# Patient Record
Sex: Female | Born: 1970 | Race: White | Hispanic: No | Marital: Married | Smoking: Former smoker
Health system: Southern US, Community
[De-identification: ages and names within clinical notes are randomized; demographics above are authoritative.]

## PROBLEM LIST (undated history)

## (undated) DIAGNOSIS — K219 Gastro-esophageal reflux disease without esophagitis: Secondary | ICD-10-CM

## (undated) DIAGNOSIS — K649 Unspecified hemorrhoids: Secondary | ICD-10-CM

## (undated) DIAGNOSIS — K635 Polyp of colon: Secondary | ICD-10-CM

## (undated) DIAGNOSIS — M797 Fibromyalgia: Secondary | ICD-10-CM

## (undated) DIAGNOSIS — F32A Depression, unspecified: Secondary | ICD-10-CM

## (undated) DIAGNOSIS — F329 Major depressive disorder, single episode, unspecified: Secondary | ICD-10-CM

## (undated) DIAGNOSIS — G43909 Migraine, unspecified, not intractable, without status migrainosus: Secondary | ICD-10-CM

## (undated) DIAGNOSIS — G4700 Insomnia, unspecified: Secondary | ICD-10-CM

## (undated) HISTORY — DX: Migraine, unspecified, not intractable, without status migrainosus: G43.909

## (undated) HISTORY — DX: Major depressive disorder, single episode, unspecified: F32.9

## (undated) HISTORY — DX: Depression, unspecified: F32.A

## (undated) HISTORY — DX: Unspecified hemorrhoids: K64.9

## (undated) HISTORY — DX: Insomnia, unspecified: G47.00

## (undated) HISTORY — DX: Fibromyalgia: M79.7

## (undated) HISTORY — DX: Polyp of colon: K63.5

## (undated) HISTORY — DX: Gastro-esophageal reflux disease without esophagitis: K21.9

---

## 2011-08-01 ENCOUNTER — Encounter: Payer: Self-pay | Admitting: Family Medicine

## 2011-08-01 ENCOUNTER — Inpatient Hospital Stay (INDEPENDENT_AMBULATORY_CARE_PROVIDER_SITE_OTHER)
Admission: RE | Admit: 2011-08-01 | Discharge: 2011-08-01 | Disposition: A | Discharge status: Home / Self Care | Payer: BC Managed Care – PPO | Source: Ambulatory Visit | Attending: Family Medicine | Admitting: Family Medicine

## 2011-08-01 DIAGNOSIS — J00 Acute nasopharyngitis [common cold]: Secondary | ICD-10-CM

## 2011-08-01 DIAGNOSIS — J209 Acute bronchitis, unspecified: Secondary | ICD-10-CM | POA: Insufficient documentation

## 2011-08-01 DIAGNOSIS — J019 Acute sinusitis, unspecified: Secondary | ICD-10-CM

## 2011-11-12 NOTE — Progress Notes (Signed)
Summary: HAD AND CHEST COLD...WSE (rm 4)   Vital Signs:  Patient Profile:   40 Years Old Female CC:      productive cough, fever, congestion, HA x 2 days Height:     65 inches Weight:      139 pounds O2 Sat:      97 % O2 treatment:    Room Air Temp:     98.3 degrees F oral Pulse rate:   88 / minute Resp:     16 per minute BP sitting:   113 / 75  (left arm) Cuff size:   regular  Vitals Entered By: Lajean Saver RN (August 01, 2011 1:52 PM)                  Prior Medication List:  No prior medications documented  Updated Prior Medication List: VICODIN 5-500 MG TABS (HYDROCODONE-ACETAMINOPHEN)  TRAZODONE HCL 100 MG TABS (TRAZODONE HCL)  CITALOPRAM HYDROBROMIDE 40 MG TABS (CITALOPRAM HYDROBROMIDE)   Current Allergies: No known allergies History of Present Illness Chief Complaint: productive cough, fever, congestion, HA x 2 days History of Present Illness: Patient reports productive cough and congestion that has been going on for 3 days. She reports fever of 101 this AM that she treated. Feeling worse ,sore throat , sinus pressure chest congestion and productive cough as well. Sh estates that her husband was sick earlier but able to shaake his infection.  Current Problems: ACUTE SINUSITIS, UNSPECIFIED (ICD-461.9) ACUTE NASOPHARYNGITIS (ICD-460) BRONCHITIS, ACUTE (ICD-466.0)   Current Meds VICODIN 5-500 MG TABS (HYDROCODONE-ACETAMINOPHEN)  TRAZODONE HCL 100 MG TABS (TRAZODONE HCL)  CITALOPRAM HYDROBROMIDE 40 MG TABS (CITALOPRAM HYDROBROMIDE)  AUGMENTIN 875-125 MG TABS (AMOXICILLIN-POT CLAVULANATE) 1 by mouth 2 times daily ALLEGRA-D ALLERGY & CONGESTION 180-240 MG XR24H-TAB (FEXOFENADINE-PSEUDOEPHEDRINE) 1 by mouth q day  REVIEW OF SYSTEMS Constitutional Symptoms       Complains of fever, chills, and fatigue.     Denies night sweats, weight loss, and weight gain.  Eyes       Denies change in vision, eye pain, eye discharge, glasses, contact lenses, and eye  surgery. Ear/Nose/Throat/Mouth       Complains of ear pain and frequent runny nose.      Denies hearing loss/aids, change in hearing, ear discharge, dizziness, frequent nose bleeds, sinus problems, sore throat, hoarseness, and tooth pain or bleeding.  Respiratory       Complains of productive cough.      Denies dry cough, wheezing, shortness of breath, asthma, bronchitis, and emphysema/COPD.  Cardiovascular       Complains of chest pain.      Denies murmurs and tires easily with exhertion.      Comments: with cough   Gastrointestinal       Denies stomach pain, nausea/vomiting, diarrhea, constipation, blood in bowel movements, and indigestion. Genitourniary       Denies painful urination, kidney stones, and loss of urinary control. Neurological       Complains of headaches.      Denies paralysis, seizures, and fainting/blackouts. Musculoskeletal       Complains of muscle pain, joint pain, joint stiffness, swelling, and muscle weakness.      Denies decreased range of motion, redness, and gout.      Comments: usual fibromyalgia pain Skin       Denies bruising, unusual mles/lumps or sores, and hair/skin or nail changes.  Psych       Complains of sleep problems.      Denies mood changes,  temper/anger issues, anxiety/stress, speech problems, and depression. Other Comments: Symptoms x 2 days. Fever 101 this am. Taken tylenol   Past History:  Social History: Last updated: 08/01/2011 Married Alcohol use-yes Drug use-no Former Smoker  Risk Factors: Smoking Status: quit (08/01/2011)  Past Medical History: endometriosis interstital cystitis fibromyalgia Migraines IBS  Past Surgical History: "endo scrapes" 5  Family History: Reviewed history and no changes required.  Social History: Reviewed history and no changes required. Married Alcohol use-yes Drug use-no Former Smoker Smoking Status:  quit Drug Use:  no Physical Exam General appearance: ill apearing WF Head:  normocephalic, atraumatic Ears: fluid noted without inflammaton left TM Nasal: swollen red turbinates with congestion Oral/Pharynx: pharyngeal erythema without exudate, uvula midline without deviation Neck: supple,anterior lymphadenopathy present Chest/Lungs: scattered rhonchi actively coughing Heart: regular rate and  rhythm, no murmur Skin: no obvious rashes or lesions MSE: oriented to time, place, and person Assessment Problems:   New Problems: ACUTE SINUSITIS, UNSPECIFIED (ICD-461.9) ACUTE NASOPHARYNGITIS (ICD-460) BRONCHITIS, ACUTE (ICD-466.0)   Patient Education: Patient and/or caregiver instructed in the following: rest fluids and Tylenol.  Plan New Medications/Changes: ALLEGRA-D ALLERGY & CONGESTION 180-240 MG XR24H-TAB (FEXOFENADINE-PSEUDOEPHEDRINE) 1 by mouth q day  #30 x 0, 08/01/2011, Hassan Rowan MD AUGMENTIN [phone] MG TABS (AMOXICILLIN-POT CLAVULANATE) 1 by mouth 2 times daily  #20 x 0, 08/01/2011, Hassan Rowan MD  New Orders: New Patient Level III [04540] Rapid Strep [98119] Follow Up: Follow up in 2-3 days if no improvement, Follow up on an as needed basis, Follow up with Primary Physician Work/School Excuse: Return to work/school in 2 days  The patient and/or caregiver has been counseled thoroughly with regard to medications prescribed including dosage, schedule, interactions, rationale for use, and possible side effects and they verbalize understanding.  Diagnoses and expected course of recovery discussed and will return if not improved as expected or if the condition worsens. Patient and/or caregiver verbalized understanding.  Prescriptions: ALLEGRA-D ALLERGY & CONGESTION 180-240 MG XR24H-TAB (FEXOFENADINE-PSEUDOEPHEDRINE) 1 by mouth q day  #30 x 0   Entered and Authorized by:   Hassan Rowan MD   Signed by:   Hassan Rowan MD on 08/01/2011   Method used:   Print then Give to Patient   RxID:   [id number] AUGMENTIN 875-125 MG TABS (AMOXICILLIN-POT CLAVULANATE)  1 by mouth 2 times daily  #20 x 0   Entered and Authorized by:   Hassan Rowan MD   Signed by:   Hassan Rowan MD on 08/01/2011   Method used:   Print then Give to Patient   RxID:   [id number]   Patient Instructions: 1)  Please schedule a follow-up appointment as needed. 2)  Please schedule an appointment with your primary doctor in :3-5days 3)  Get plenty of rest, drink lots of clear liquids, and use Tylenol or Ibuprofen for fever and comfort. Return in 7-10 days if you're not better:sooner if you're feeling worse. 4)  Take your antibiotic as prescribed until ALL of it is gone, but stop if you develop a rash or swelling and contact our office as soon as possible. 5)  Acute sinusitis symptoms for less than 10 days are not helped by antibiotics.Use warm moist compresses, and over the counter decongestants ( only as directed). Call if no improvement in 5-7 days, sooner if increasing pain, fever, or new symptoms. 6)  Acute bronchitis symptoms for less than 10 days are not helped by antibiotics. take over the counter cough medications. call if no improvment in  5-7 days,  sooner if increasing cough, fever, or new symptoms( shortness of breath, chest pain). 7)  Recommended remaining out of work for today and tommorow.  Orders Added: 1)  New Patient Level III [99203] 2)  Rapid Strep [16109]    Laboratory Results  Date/Time Received: August 01, 2011 2:49 PM  Date/Time Reported: August 01, 2011 2:49 PM   Other Tests  Rapid Strep: negative  Kit Test Internal QC: Negative   (Normal Range: Negative)

## 2011-11-12 NOTE — Letter (Signed)
Summary: Out of Work  [facility]  [address]   [place], [place] [address]   Phone: [phone]  Fax: [phone]    August 01, 2011   Employee:  Tobe Sos    To Whom It May Concern:   For Medical reasons, please excuse the above named employee from work for the following dates:  Start:   08/01/2011  Return:   08/03/2011  If you need additional information, please feel free to contact our office.         Sincerely,    Hassan Rowan MD

## 2014-01-24 DIAGNOSIS — G894 Chronic pain syndrome: Secondary | ICD-10-CM | POA: Insufficient documentation

## 2014-01-24 DIAGNOSIS — R519 Headache, unspecified: Secondary | ICD-10-CM | POA: Insufficient documentation

## 2015-04-19 DIAGNOSIS — G43101 Migraine with aura, not intractable, with status migrainosus: Secondary | ICD-10-CM | POA: Insufficient documentation

## 2015-12-27 DIAGNOSIS — M542 Cervicalgia: Secondary | ICD-10-CM | POA: Insufficient documentation

## 2016-07-03 DIAGNOSIS — M47812 Spondylosis without myelopathy or radiculopathy, cervical region: Secondary | ICD-10-CM | POA: Insufficient documentation

## 2017-11-25 DIAGNOSIS — K219 Gastro-esophageal reflux disease without esophagitis: Secondary | ICD-10-CM | POA: Insufficient documentation

## 2018-01-17 DIAGNOSIS — G43909 Migraine, unspecified, not intractable, without status migrainosus: Secondary | ICD-10-CM | POA: Diagnosis not present

## 2018-02-03 DIAGNOSIS — M47814 Spondylosis without myelopathy or radiculopathy, thoracic region: Secondary | ICD-10-CM | POA: Insufficient documentation

## 2018-02-10 DIAGNOSIS — K219 Gastro-esophageal reflux disease without esophagitis: Secondary | ICD-10-CM | POA: Diagnosis not present

## 2018-02-10 DIAGNOSIS — K921 Melena: Secondary | ICD-10-CM | POA: Diagnosis not present

## 2018-02-10 DIAGNOSIS — K5730 Diverticulosis of large intestine without perforation or abscess without bleeding: Secondary | ICD-10-CM | POA: Diagnosis not present

## 2018-02-10 DIAGNOSIS — K209 Esophagitis, unspecified: Secondary | ICD-10-CM | POA: Diagnosis not present

## 2018-02-10 LAB — HM COLONOSCOPY

## 2018-02-18 DIAGNOSIS — K219 Gastro-esophageal reflux disease without esophagitis: Secondary | ICD-10-CM | POA: Diagnosis not present

## 2018-02-18 DIAGNOSIS — M542 Cervicalgia: Secondary | ICD-10-CM | POA: Diagnosis not present

## 2018-02-18 DIAGNOSIS — G4700 Insomnia, unspecified: Secondary | ICD-10-CM | POA: Diagnosis not present

## 2018-02-18 DIAGNOSIS — Z820 Family history of epilepsy and other diseases of the nervous system: Secondary | ICD-10-CM | POA: Diagnosis not present

## 2018-02-18 DIAGNOSIS — M797 Fibromyalgia: Secondary | ICD-10-CM | POA: Diagnosis not present

## 2018-02-18 DIAGNOSIS — K449 Diaphragmatic hernia without obstruction or gangrene: Secondary | ICD-10-CM | POA: Diagnosis not present

## 2018-02-18 DIAGNOSIS — F418 Other specified anxiety disorders: Secondary | ICD-10-CM | POA: Diagnosis not present

## 2018-02-18 DIAGNOSIS — G894 Chronic pain syndrome: Secondary | ICD-10-CM | POA: Diagnosis not present

## 2018-02-18 DIAGNOSIS — Z91030 Bee allergy status: Secondary | ICD-10-CM | POA: Diagnosis not present

## 2018-02-18 DIAGNOSIS — G43109 Migraine with aura, not intractable, without status migrainosus: Secondary | ICD-10-CM | POA: Diagnosis not present

## 2018-02-18 DIAGNOSIS — Z8269 Family history of other diseases of the musculoskeletal system and connective tissue: Secondary | ICD-10-CM | POA: Diagnosis not present

## 2018-02-18 DIAGNOSIS — N809 Endometriosis, unspecified: Secondary | ICD-10-CM | POA: Diagnosis not present

## 2018-02-18 DIAGNOSIS — K589 Irritable bowel syndrome without diarrhea: Secondary | ICD-10-CM | POA: Diagnosis not present

## 2018-02-18 DIAGNOSIS — M47812 Spondylosis without myelopathy or radiculopathy, cervical region: Secondary | ICD-10-CM | POA: Diagnosis not present

## 2018-02-18 DIAGNOSIS — Z87891 Personal history of nicotine dependence: Secondary | ICD-10-CM | POA: Diagnosis not present

## 2018-02-18 DIAGNOSIS — M47814 Spondylosis without myelopathy or radiculopathy, thoracic region: Secondary | ICD-10-CM | POA: Diagnosis not present

## 2018-03-04 DIAGNOSIS — F419 Anxiety disorder, unspecified: Secondary | ICD-10-CM | POA: Diagnosis not present

## 2018-03-04 DIAGNOSIS — Z87891 Personal history of nicotine dependence: Secondary | ICD-10-CM | POA: Diagnosis not present

## 2018-03-04 DIAGNOSIS — Z91030 Bee allergy status: Secondary | ICD-10-CM | POA: Diagnosis not present

## 2018-03-04 DIAGNOSIS — F329 Major depressive disorder, single episode, unspecified: Secondary | ICD-10-CM | POA: Diagnosis not present

## 2018-03-04 DIAGNOSIS — M797 Fibromyalgia: Secondary | ICD-10-CM | POA: Diagnosis not present

## 2018-03-04 DIAGNOSIS — G43909 Migraine, unspecified, not intractable, without status migrainosus: Secondary | ICD-10-CM | POA: Diagnosis not present

## 2018-03-04 DIAGNOSIS — K219 Gastro-esophageal reflux disease without esophagitis: Secondary | ICD-10-CM | POA: Diagnosis not present

## 2018-03-04 DIAGNOSIS — M47812 Spondylosis without myelopathy or radiculopathy, cervical region: Secondary | ICD-10-CM | POA: Diagnosis not present

## 2018-03-04 DIAGNOSIS — G894 Chronic pain syndrome: Secondary | ICD-10-CM | POA: Diagnosis not present

## 2018-05-29 DIAGNOSIS — Z01419 Encounter for gynecological examination (general) (routine) without abnormal findings: Secondary | ICD-10-CM | POA: Diagnosis not present

## 2018-05-29 DIAGNOSIS — Z124 Encounter for screening for malignant neoplasm of cervix: Secondary | ICD-10-CM | POA: Diagnosis not present

## 2018-05-29 DIAGNOSIS — Z6827 Body mass index (BMI) 27.0-27.9, adult: Secondary | ICD-10-CM | POA: Diagnosis not present

## 2018-05-29 LAB — HM PAP SMEAR

## 2018-06-05 DIAGNOSIS — G43101 Migraine with aura, not intractable, with status migrainosus: Secondary | ICD-10-CM | POA: Diagnosis not present

## 2018-06-05 DIAGNOSIS — K219 Gastro-esophageal reflux disease without esophagitis: Secondary | ICD-10-CM | POA: Diagnosis not present

## 2018-06-05 DIAGNOSIS — G894 Chronic pain syndrome: Secondary | ICD-10-CM | POA: Diagnosis not present

## 2018-06-05 DIAGNOSIS — K449 Diaphragmatic hernia without obstruction or gangrene: Secondary | ICD-10-CM | POA: Diagnosis not present

## 2018-06-05 DIAGNOSIS — F330 Major depressive disorder, recurrent, mild: Secondary | ICD-10-CM | POA: Diagnosis not present

## 2018-06-09 DIAGNOSIS — M47812 Spondylosis without myelopathy or radiculopathy, cervical region: Secondary | ICD-10-CM | POA: Diagnosis not present

## 2018-06-09 DIAGNOSIS — G894 Chronic pain syndrome: Secondary | ICD-10-CM | POA: Diagnosis not present

## 2018-06-09 DIAGNOSIS — M542 Cervicalgia: Secondary | ICD-10-CM | POA: Diagnosis not present

## 2018-06-09 DIAGNOSIS — G43909 Migraine, unspecified, not intractable, without status migrainosus: Secondary | ICD-10-CM | POA: Diagnosis not present

## 2018-07-08 DIAGNOSIS — M797 Fibromyalgia: Secondary | ICD-10-CM | POA: Diagnosis not present

## 2018-08-12 DIAGNOSIS — M797 Fibromyalgia: Secondary | ICD-10-CM | POA: Diagnosis not present

## 2018-08-12 DIAGNOSIS — M47812 Spondylosis without myelopathy or radiculopathy, cervical region: Secondary | ICD-10-CM | POA: Diagnosis not present

## 2018-08-12 DIAGNOSIS — G894 Chronic pain syndrome: Secondary | ICD-10-CM | POA: Diagnosis not present

## 2018-08-25 DIAGNOSIS — M47812 Spondylosis without myelopathy or radiculopathy, cervical region: Secondary | ICD-10-CM | POA: Diagnosis not present

## 2018-08-25 DIAGNOSIS — Z91030 Bee allergy status: Secondary | ICD-10-CM | POA: Diagnosis not present

## 2018-08-25 DIAGNOSIS — F329 Major depressive disorder, single episode, unspecified: Secondary | ICD-10-CM | POA: Diagnosis not present

## 2018-08-25 DIAGNOSIS — F419 Anxiety disorder, unspecified: Secondary | ICD-10-CM | POA: Diagnosis not present

## 2018-08-25 DIAGNOSIS — Z87891 Personal history of nicotine dependence: Secondary | ICD-10-CM | POA: Diagnosis not present

## 2018-08-25 DIAGNOSIS — N3010 Interstitial cystitis (chronic) without hematuria: Secondary | ICD-10-CM | POA: Diagnosis not present

## 2018-08-25 DIAGNOSIS — M797 Fibromyalgia: Secondary | ICD-10-CM | POA: Diagnosis not present

## 2018-08-25 DIAGNOSIS — K589 Irritable bowel syndrome without diarrhea: Secondary | ICD-10-CM | POA: Diagnosis not present

## 2018-08-25 DIAGNOSIS — K219 Gastro-esophageal reflux disease without esophagitis: Secondary | ICD-10-CM | POA: Diagnosis not present

## 2018-08-25 DIAGNOSIS — K449 Diaphragmatic hernia without obstruction or gangrene: Secondary | ICD-10-CM | POA: Diagnosis not present

## 2018-09-08 DIAGNOSIS — Z87891 Personal history of nicotine dependence: Secondary | ICD-10-CM | POA: Diagnosis not present

## 2018-09-08 DIAGNOSIS — F419 Anxiety disorder, unspecified: Secondary | ICD-10-CM | POA: Diagnosis not present

## 2018-09-08 DIAGNOSIS — M797 Fibromyalgia: Secondary | ICD-10-CM | POA: Diagnosis not present

## 2018-09-08 DIAGNOSIS — N3010 Interstitial cystitis (chronic) without hematuria: Secondary | ICD-10-CM | POA: Diagnosis not present

## 2018-09-08 DIAGNOSIS — Z91030 Bee allergy status: Secondary | ICD-10-CM | POA: Diagnosis not present

## 2018-09-08 DIAGNOSIS — K589 Irritable bowel syndrome without diarrhea: Secondary | ICD-10-CM | POA: Diagnosis not present

## 2018-09-08 DIAGNOSIS — K219 Gastro-esophageal reflux disease without esophagitis: Secondary | ICD-10-CM | POA: Diagnosis not present

## 2018-09-08 DIAGNOSIS — F329 Major depressive disorder, single episode, unspecified: Secondary | ICD-10-CM | POA: Diagnosis not present

## 2018-09-08 DIAGNOSIS — M47812 Spondylosis without myelopathy or radiculopathy, cervical region: Secondary | ICD-10-CM | POA: Diagnosis not present

## 2018-09-08 DIAGNOSIS — B880 Other acariasis: Secondary | ICD-10-CM | POA: Diagnosis not present

## 2018-09-24 DIAGNOSIS — G894 Chronic pain syndrome: Secondary | ICD-10-CM | POA: Diagnosis not present

## 2018-09-24 DIAGNOSIS — M797 Fibromyalgia: Secondary | ICD-10-CM | POA: Diagnosis not present

## 2018-09-24 DIAGNOSIS — G43909 Migraine, unspecified, not intractable, without status migrainosus: Secondary | ICD-10-CM | POA: Diagnosis not present

## 2018-09-24 DIAGNOSIS — M47812 Spondylosis without myelopathy or radiculopathy, cervical region: Secondary | ICD-10-CM | POA: Diagnosis not present

## 2018-09-25 DIAGNOSIS — Z5181 Encounter for therapeutic drug level monitoring: Secondary | ICD-10-CM | POA: Diagnosis not present

## 2018-09-25 DIAGNOSIS — G894 Chronic pain syndrome: Secondary | ICD-10-CM | POA: Diagnosis not present

## 2018-09-25 DIAGNOSIS — M797 Fibromyalgia: Secondary | ICD-10-CM | POA: Diagnosis not present

## 2018-09-25 DIAGNOSIS — G43101 Migraine with aura, not intractable, with status migrainosus: Secondary | ICD-10-CM | POA: Diagnosis not present

## 2018-09-25 DIAGNOSIS — Z79899 Other long term (current) drug therapy: Secondary | ICD-10-CM | POA: Diagnosis not present

## 2018-11-03 ENCOUNTER — Encounter: Payer: Self-pay | Admitting: Family

## 2018-11-03 ENCOUNTER — Ambulatory Visit (INDEPENDENT_AMBULATORY_CARE_PROVIDER_SITE_OTHER): Payer: BLUE CROSS/BLUE SHIELD | Admitting: Family

## 2018-11-03 ENCOUNTER — Encounter (INDEPENDENT_AMBULATORY_CARE_PROVIDER_SITE_OTHER): Payer: Self-pay

## 2018-11-03 ENCOUNTER — Other Ambulatory Visit (INDEPENDENT_AMBULATORY_CARE_PROVIDER_SITE_OTHER): Payer: BLUE CROSS/BLUE SHIELD

## 2018-11-03 VITALS — BP 112/80 | HR 67 | Temp 97.7°F | Ht 65.5 in | Wt 166.1 lb

## 2018-11-03 DIAGNOSIS — F32A Depression, unspecified: Secondary | ICD-10-CM

## 2018-11-03 DIAGNOSIS — F419 Anxiety disorder, unspecified: Secondary | ICD-10-CM | POA: Insufficient documentation

## 2018-11-03 DIAGNOSIS — F5104 Psychophysiologic insomnia: Secondary | ICD-10-CM | POA: Insufficient documentation

## 2018-11-03 DIAGNOSIS — G43709 Chronic migraine without aura, not intractable, without status migrainosus: Secondary | ICD-10-CM

## 2018-11-03 DIAGNOSIS — Z13220 Encounter for screening for lipoid disorders: Secondary | ICD-10-CM

## 2018-11-03 DIAGNOSIS — R5383 Other fatigue: Secondary | ICD-10-CM | POA: Diagnosis not present

## 2018-11-03 DIAGNOSIS — F329 Major depressive disorder, single episode, unspecified: Secondary | ICD-10-CM

## 2018-11-03 DIAGNOSIS — K219 Gastro-esophageal reflux disease without esophagitis: Secondary | ICD-10-CM

## 2018-11-03 LAB — LIPID PANEL
CHOL/HDL RATIO: 4
Cholesterol: 223 mg/dL — ABNORMAL HIGH (ref 0–200)
HDL: 58.8 mg/dL (ref >39.00)
LDL CALC: 130 mg/dL — AB (ref 0–99)
NONHDL: 164.41
TRIGLYCERIDES: 170 mg/dL — AB (ref 0.0–149.0)
VLDL: 34 mg/dL (ref 0.0–40.0)

## 2018-11-03 LAB — COMPREHENSIVE METABOLIC PANEL
ALT: 6 U/L (ref 0–35)
AST: 13 U/L (ref 0–37)
Albumin: 4.1 g/dL (ref 3.5–5.2)
Alkaline Phosphatase: 34 U/L — ABNORMAL LOW (ref 39–117)
BUN: 27 mg/dL — ABNORMAL HIGH (ref 6–23)
CALCIUM: 9.2 mg/dL (ref 8.4–10.5)
CHLORIDE: 109 meq/L (ref 96–112)
CO2: 19 meq/L (ref 19–32)
Creatinine, Ser: 1.22 mg/dL — ABNORMAL HIGH (ref 0.40–1.20)
GFR: 50.31 mL/min — AB (ref >60.00)
GLUCOSE: 84 mg/dL (ref 70–99)
Potassium: 4 mEq/L (ref 3.5–5.1)
Sodium: 136 mEq/L (ref 135–145)
Total Bilirubin: 0.3 mg/dL (ref 0.2–1.2)
Total Protein: 7.5 g/dL (ref 6.0–8.3)

## 2018-11-03 LAB — CBC WITH DIFFERENTIAL/PLATELET
BASOS PCT: 0.9 % (ref 0.0–3.0)
Basophils Absolute: 0.1 10*3/uL (ref 0.0–0.1)
EOS PCT: 1.4 % (ref 0.0–5.0)
Eosinophils Absolute: 0.1 10*3/uL (ref 0.0–0.7)
HEMATOCRIT: 41.7 % (ref 36.0–46.0)
Hemoglobin: 14 g/dL (ref 12.0–15.0)
LYMPHS PCT: 32.1 % (ref 12.0–46.0)
Lymphs Abs: 3.4 10*3/uL (ref 0.7–4.0)
MCHC: 33.5 g/dL (ref 30.0–36.0)
MCV: 92.9 fl (ref 78.0–100.0)
MONOS PCT: 7 % (ref 3.0–12.0)
Monocytes Absolute: 0.7 10*3/uL (ref 0.1–1.0)
NEUTROS ABS: 6.1 10*3/uL (ref 1.4–7.7)
Neutrophils Relative %: 58.6 % (ref 43.0–77.0)
PLATELETS: 388 10*3/uL (ref 150.0–400.0)
RBC: 4.48 Mil/uL (ref 3.87–5.11)
RDW: 12.9 % (ref 11.5–15.5)
WBC: 10.5 10*3/uL (ref 4.0–10.5)

## 2018-11-03 LAB — VITAMIN B12: Vitamin B-12: >1500 pg/mL — ABNORMAL HIGH (ref 211–911)

## 2018-11-03 LAB — VITAMIN D 25 HYDROXY (VIT D DEFICIENCY, FRACTURES): VITD: 85.9 ng/mL (ref 30.00–100.00)

## 2018-11-03 LAB — TSH: TSH: 2.07 u[IU]/mL (ref 0.35–4.50)

## 2018-11-03 MED ORDER — TRAZODONE HCL 100 MG PO TABS: 100.0000 mg | ORAL_TABLET | Freq: Every evening | ORAL | 1 refills | Status: DC | PRN

## 2018-11-03 MED ORDER — OMEPRAZOLE 40 MG PO CPDR: 40.0000 mg | DELAYED_RELEASE_CAPSULE | Freq: Every day | ORAL | 1 refills | Status: DC

## 2018-11-03 MED ORDER — BUPROPION HCL ER (XL) 150 MG PO TB24: 150.0000 mg | ORAL_TABLET | Freq: Every day | ORAL | 1 refills | Status: DC

## 2018-11-03 MED ORDER — RIZATRIPTAN BENZOATE 10 MG PO TABS: 10.0000 mg | ORAL_TABLET | ORAL | 0 refills | Status: DC | PRN

## 2018-11-03 NOTE — Progress Notes (Signed)
Teresa Campbell is a 47 y.o. female with the following history as recorded in [employer]:  Patient Active Problem List   Diagnosis Date Noted  . Chronic migraine without aura without status migrainosus, not intractable 11/03/2018  . Depression 11/03/2018  . Gastroesophageal reflux disease 11/03/2018  . Chronic insomnia 11/03/2018  . ACUTE SINUSITIS, UNSPECIFIED 08/01/2011  . BRONCHITIS, ACUTE 08/01/2011    Current Outpatient Medications  Medication Sig Dispense Refill  . amitriptyline (ELAVIL) 25 MG tablet Take by mouth.    . citalopram (CELEXA) 40 MG tablet     . HYDROcodone-acetaminophen (NORCO/VICODIN) 5-325 MG tablet TK 1 T PO Q 8 H PRN P FOR UP TO 30 DAYS  0  . Meclizine HCl 25 MG CHEW Chew by mouth.    Marland Kitchen NUVARING 0.12-0.015 MG/24HR vaginal ring     . omeprazole (PRILOSEC) 40 MG capsule Take 1 capsule (40 mg total) by mouth daily. 90 capsule 1  . rizatriptan (MAXALT) 10 MG tablet Take 1 tablet (10 mg total) by mouth as needed for migraine. May repeat in 2 hours if needed 54 tablet 0  . topiramate (TOPAMAX) 50 MG tablet TK 1 T HS X7 DAYS THEN 2 TS HS X7 DAYS THEN 1 T IN AM AND 2 T HS X7 DS THEN 2 TS BID  1  . traZODone (DESYREL) 100 MG tablet Take 1 tablet (100 mg total) by mouth at bedtime as needed for sleep. 90 tablet 1  . buPROPion (WELLBUTRIN XL) 150 MG 24 hr tablet Take 1 tablet (150 mg total) by mouth daily. 30 tablet 1  . gabapentin (NEURONTIN) 400 MG capsule   2   No current facility-administered medications for this visit.     Allergies: Cymbalta [duloxetine hcl]; Lyrica [pregabalin]; and Tramadol  Past Medical History:  Diagnosis Date  . Colon polyps   . Depression   . Fibromyalgia   . GERD (gastroesophageal reflux disease)   . Hemorrhoids   . Insomnia   . Migraine     History reviewed. No pertinent surgical history.  Family History  Problem Relation Age of Onset  . Arthritis Mother   . COPD Mother   . Depression Mother   . Hyperlipidemia Mother   .  Miscarriages / [place] Mother   . Arthritis Father   . Depression Father   . Heart disease Father   . Hyperlipidemia Father   . Alcohol abuse Sister   . Depression Sister   . Drug abuse Sister   . Hyperlipidemia Sister   . Hypertension Sister   . Alcohol abuse Brother   . Depression Brother   . Drug abuse Brother   . Depression Daughter   . Alcohol abuse Maternal Grandmother   . Depression Maternal Grandmother   . Stroke Maternal Grandfather   . Arthritis Paternal Grandmother   . Diabetes Paternal Grandmother   . Stroke Paternal Grandfather   . Alcohol abuse Sister   . Depression Sister   . Drug abuse Sister   . Depression Brother     Social History   Tobacco Use  . Smoking status: Former Research scientist (life sciences)  . Smokeless tobacco: Never Used  Substance Use Topics  . Alcohol use: Yes    Subjective:  Patient presents today as a new patient; history of depression, fibromyalgia, migraine headaches; in the past month, has been having increased migraine headaches- was not able to get her Botox injections and as a result started to have more migraines; the increased pain has caused increased secondary depression symptoms;  had also been using medicinal device called Gamacor- provides "shock treatment" to vaso-vagal nerve; in the process of tapering up on her Topamax- goal will be 100 mg bid; feels like in the interim, needs more Maxalt; has not seen a neurologist- would be interested in seeing a headache specialist;  In the process of tapering off Gabapentin- will be off of this medication as of Thursday am; was using this for her fibromyalgia- okay with decision to stop Gabapentin;  GERD- stable on Omeprazole; Uses Elavil on the weekends to help with her fibromyalgia pain- notes she cannot take this during work week- causes her to be too drowsy; Does have GYN managing pap smear, mammogram;     Objective:  Vitals:   11/03/18 0834  BP: 112/80  Pulse: 67  Temp: 97.7 F (36.5 C)  TempSrc:  Oral  SpO2: 98%  Weight: 166 lb 1.9 oz (75.4 kg)  Height: 5' 5.5" (1.664 m)    General: Well developed, well nourished, in no acute distress  Skin : Warm and dry.  Head: Normocephalic and atraumatic  Eyes: Sclera and conjunctiva clear; pupils round and reactive to light; extraocular movements intact  Neck: Supple without thyromegaly, adenopathy  Lungs: Respirations unlabored; Neurologic: Alert and oriented; speech intact; face symmetrical; moves all extremities well; CNII-XII intact without focal deficit   Limited exam- majority of visit spent in counseling;  Assessment:  1. Chronic migraine without aura without status migrainosus, not intractable   2. Depression, unspecified depression type   3. Other fatigue   4. Lipid screening   5. Gastroesophageal reflux disease, esophagitis presence not specified   6. Chronic insomnia     Plan:  1. Refer to neurology to discuss medication options; did very well on Botox but became cost prohibitive- maybe neurology has patient assistance programs; might be a candidate for some of the newer injectable agents; stay on Topamax for now; cannot tolerate Elavil on daily basis so will not try changing to Nortriptyline at this time. 2. Suspect worsening symptoms due to the chronic pain related to uncontrolled migraines; continue Celexa 40 mg daily/ try adding Wellbutrin XL 150 mg qd; follow-up in 3 weeks, sooner prn. 3. Update labs to rule out underlying metabolic source for symptoms; 4. Update lipid panel;  5. Stable; Refill updated;  6. Stable; refill updated;   Time spent with patient 45+ minutes; greater than 50% spent in counseling;   Return in about 3 weeks (around 11/24/2018).  Orders Placed This Encounter  Procedures  . CBC w/Diff    Standing Status:   Future    Number of Occurrences:   1    Standing Expiration Date:   11/03/2019  . Comp Met (CMET)    Standing Status:   Future    Number of Occurrences:   1    Standing Expiration Date:    11/03/2019  . Lipid panel    Standing Status:   Future    Number of Occurrences:   1    Standing Expiration Date:   11/04/2019  . TSH    Standing Status:   Future    Number of Occurrences:   1    Standing Expiration Date:   11/03/2019  . Vitamin D (25 hydroxy)    Standing Status:   Future    Number of Occurrences:   1    Standing Expiration Date:   11/03/2019  . B12    Standing Status:   Future    Number of Occurrences:  1    Standing Expiration Date:   11/03/2019  . Ambulatory referral to Neurology    Referral Priority:   Urgent    Referral Type:   Consultation    Referral Reason:   Specialty Services Required    Requested Specialty:   Neurology    Number of Visits Requested:   1    Requested Prescriptions   Signed Prescriptions Disp Refills  . buPROPion (WELLBUTRIN XL) 150 MG 24 hr tablet 30 tablet 1    Sig: Take 1 tablet (150 mg total) by mouth daily.  Marland Kitchen omeprazole (PRILOSEC) 40 MG capsule 90 capsule 1    Sig: Take 1 capsule (40 mg total) by mouth daily.  . rizatriptan (MAXALT) 10 MG tablet 54 tablet 0    Sig: Take 1 tablet (10 mg total) by mouth as needed for migraine. May repeat in 2 hours if needed  . traZODone (DESYREL) 100 MG tablet 90 tablet 1    Sig: Take 1 tablet (100 mg total) by mouth at bedtime as needed for sleep.

## 2018-11-03 NOTE — Progress Notes (Signed)
Outside notes received. Information abstracted. Notes sent to scan.  

## 2018-11-10 ENCOUNTER — Encounter: Payer: Self-pay | Admitting: Neurology

## 2018-11-12 ENCOUNTER — Encounter: Payer: Self-pay | Admitting: Family

## 2018-11-12 NOTE — Progress Notes (Signed)
Outside notes received. Information abstracted. Notes sent to scan.  

## 2018-11-21 ENCOUNTER — Other Ambulatory Visit: Payer: Self-pay | Admitting: Family

## 2018-11-21 MED ORDER — BUPROPION HCL ER (XL) 300 MG PO TB24: 300.0000 mg | ORAL_TABLET | Freq: Every day | ORAL | 2 refills | Status: DC

## 2018-11-25 ENCOUNTER — Ambulatory Visit: Payer: BLUE CROSS/BLUE SHIELD | Admitting: Family

## 2018-11-27 DIAGNOSIS — M797 Fibromyalgia: Secondary | ICD-10-CM | POA: Diagnosis not present

## 2018-11-27 DIAGNOSIS — G894 Chronic pain syndrome: Secondary | ICD-10-CM | POA: Diagnosis not present

## 2018-11-27 DIAGNOSIS — G43101 Migraine with aura, not intractable, with status migrainosus: Secondary | ICD-10-CM | POA: Diagnosis not present

## 2018-12-15 ENCOUNTER — Encounter: Payer: Self-pay | Admitting: Family

## 2018-12-15 ENCOUNTER — Other Ambulatory Visit: Payer: Self-pay | Admitting: Family

## 2018-12-15 MED ORDER — OMEPRAZOLE 40 MG PO CPDR: 40.0000 mg | DELAYED_RELEASE_CAPSULE | Freq: Every day | ORAL | 3 refills | Status: AC

## 2018-12-15 MED ORDER — RIZATRIPTAN BENZOATE 10 MG PO TABS: 10.0000 mg | ORAL_TABLET | ORAL | 1 refills | Status: DC | PRN

## 2018-12-15 MED ORDER — TRAZODONE HCL 100 MG PO TABS: 100.0000 mg | ORAL_TABLET | Freq: Every evening | ORAL | 3 refills | Status: DC | PRN

## 2018-12-15 MED ORDER — BUPROPION HCL ER (XL) 300 MG PO TB24: 300.0000 mg | ORAL_TABLET | Freq: Every day | ORAL | 3 refills | Status: DC

## 2018-12-15 MED ORDER — AZITHROMYCIN 250 MG PO TABS: ORAL_TABLET | ORAL | 0 refills | Status: DC

## 2018-12-22 DIAGNOSIS — G894 Chronic pain syndrome: Secondary | ICD-10-CM | POA: Diagnosis not present

## 2018-12-22 DIAGNOSIS — M797 Fibromyalgia: Secondary | ICD-10-CM | POA: Diagnosis not present

## 2018-12-29 DIAGNOSIS — M797 Fibromyalgia: Secondary | ICD-10-CM | POA: Diagnosis not present

## 2018-12-29 DIAGNOSIS — G43101 Migraine with aura, not intractable, with status migrainosus: Secondary | ICD-10-CM | POA: Diagnosis not present

## 2018-12-29 DIAGNOSIS — G894 Chronic pain syndrome: Secondary | ICD-10-CM | POA: Diagnosis not present

## 2019-01-05 DIAGNOSIS — M792 Neuralgia and neuritis, unspecified: Secondary | ICD-10-CM | POA: Diagnosis not present

## 2019-01-05 DIAGNOSIS — G894 Chronic pain syndrome: Secondary | ICD-10-CM | POA: Diagnosis not present

## 2019-01-05 DIAGNOSIS — G43101 Migraine with aura, not intractable, with status migrainosus: Secondary | ICD-10-CM | POA: Diagnosis not present

## 2019-01-05 DIAGNOSIS — M797 Fibromyalgia: Secondary | ICD-10-CM | POA: Diagnosis not present

## 2019-01-13 NOTE — Progress Notes (Signed)
NEUROLOGY CONSULTATION NOTE  Teresa Campbell MRN: [id number] DOB: Jul 25, 1971  Referring provider: Olive BassLaura Woodruff Murray, FNP Primary care provider: Olive BassLaura Woodruff Murray, FNP  Reason for consult:  migraine  HISTORY OF PRESENT ILLNESS: Teresa Campbell is a 48 year old right-handed woman with fibromyalgia and depression who presents for migraines.  History supplemented by referring provider notes.  Onset:  Since childhood.  Missed school as a child.  Worse after starting her menstrual cycle. Location:  Bi-frontal Quality:  Pounding Intensity:  Severe.  She denies new headache, thunderclap headache or severe headache that wakes her from sleep. Aura:  Flashing lights/blue colors Prodrome:  none Postdrome:  fatigue Associated symptoms:  Photophobia, phonophobia, nausea, osmophobia, sometimes vomiting.  She denies associated unilateral numbness or weakness. Duration:  2 days with Maxalt (1 hour if taken at onset) Frequency:  2 migraines past 30 days (15-17 a month prior to topiramate) Frequency of abortive medication: 2 days past month Triggers: Increased depression Relieving factors:  Ice pack on head and heat to back of neck Activity:  aggravates  Current NSAIDS:  none Current analgesics: hydrocodone (fibromyalgia) Current triptans: Rizatriptan 10 mg Current ergotamine:  none Current anti-emetic:  none Current muscle relaxants:  none Current anti-anxiolytic:  none Current sleep aide: Trazodone Current Antihypertensive medications:  none Current Antidepressant medications: citalopram 40 mg daily, bupropion 150 mg Current Anticonvulsant medications: Topiramate 50 mg twice daily (on higher doses she had hair thinning, weight loss) Current anti-CGRP:  none Current Vitamins/Herbal/Supplements:  none Current Antihistamines/Decongestants:  dramamine Other therapy:  none Hormone/birth control: NuvaRing  Past NSAIDS:  Ibuprofen, naproxen, Toradol shot Past analgesics:  Tylenol,  Excedrin Past abortive triptans:Zomig, sumatriptan tablet Past abortive ergotamine:  none Past muscle relaxants:  none Past anti-emetic:  none Past antihypertensive medications:  none Past antidepressant medications:  Amitriptyline 25mg  Past anticonvulsant medications:  gabapentin Past anti-CGRP:  none Past vitamins/Herbal/Supplements:  none Past antihistamines/decongestants:  none Other past therapies:  Botox (effective.  However, she was having trouble getting the Botox due to cost), Gamacor vagus nerve stimulator (abortive treatment, effective)  Caffeine:  1 cup of coffee daily Diet:  Drinks water all day.  Eats healthy Exercise:  routine Depression:  yes; Anxiety:  yes Other pain:  fibromyalgia Sleep hygiene:  Okay with trazodone and exercise Family history of headache:  Father, paternal grandfather, 2 aunts  11/03/2018 labs: CBC with WBC 10.5, HCT 41.7, Hgb 14, PLT 388; B12 >1500; vitamin D 85.90; TSH 2.07; CMP with NA 136, K4, CL 109, CO2 19, glucose 84, BUN 27, CR 1.22, T bili 0.3, ALP 34, AST 13, ALT 6.  PAST MEDICAL HISTORY: Past Medical History:  Diagnosis Date  . Colon polyps   . Depression   . Fibromyalgia   . GERD (gastroesophageal reflux disease)   . Hemorrhoids   . Insomnia   . Migraine     PAST SURGICAL HISTORY: No past surgical history on file.  MEDICATIONS: Current Outpatient Medications on File Prior to Visit  Medication Sig Dispense Refill  . amitriptyline (ELAVIL) 25 MG tablet Take by mouth.    Marland Kitchen. azithromycin (ZITHROMAX) 250 MG tablet 2 tabs po qd x 1 day; 1 tablet per day x 4 days; 6 tablet 0  . buPROPion (WELLBUTRIN XL) 300 MG 24 hr tablet Take 1 tablet (300 mg total) by mouth daily. 90 tablet 3  . citalopram (CELEXA) 40 MG tablet     . gabapentin (NEURONTIN) 400 MG capsule   2  . HYDROcodone-acetaminophen (NORCO/VICODIN) 5-325 MG tablet TK 1  T PO Q 8 H PRN P FOR UP TO 30 DAYS  0  . Meclizine HCl 25 MG CHEW Chew by mouth.    Marland Kitchen NUVARING 0.12-0.015  MG/24HR vaginal ring     . omeprazole (PRILOSEC) 40 MG capsule Take 1 capsule (40 mg total) by mouth daily. 90 capsule 3  . rizatriptan (MAXALT) 10 MG tablet Take 1 tablet (10 mg total) by mouth as needed for migraine. May repeat in 2 hours if needed 54 tablet 1  . topiramate (TOPAMAX) 50 MG tablet TK 1 T HS X7 DAYS THEN 2 TS HS X7 DAYS THEN 1 T IN AM AND 2 T HS X7 DS THEN 2 TS BID  1  . traZODone (DESYREL) 100 MG tablet Take 1 tablet (100 mg total) by mouth at bedtime as needed for sleep. 90 tablet 3   No current facility-administered medications on file prior to visit.     ALLERGIES: Allergies  Allergen Reactions  . Cymbalta [Duloxetine Hcl]     Ineffective  . Lyrica [Pregabalin] Other (See Comments)    Ineffective  . Tramadol     Ineffective     FAMILY HISTORY: Family History  Problem Relation Age of Onset  . Arthritis Mother   . COPD Mother   . Depression Mother   . Hyperlipidemia Mother   . Miscarriages / [place] Mother   . Arthritis Father   . Depression Father   . Heart disease Father   . Hyperlipidemia Father   . Alcohol abuse Sister   . Depression Sister   . Drug abuse Sister   . Hyperlipidemia Sister   . Hypertension Sister   . Alcohol abuse Brother   . Depression Brother   . Drug abuse Brother   . Depression Daughter   . Alcohol abuse Maternal Grandmother   . Depression Maternal Grandmother   . Stroke Maternal Grandfather   . Arthritis Paternal Grandmother   . Diabetes Paternal Grandmother   . Stroke Paternal Grandfather   . Alcohol abuse Sister   . Depression Sister   . Drug abuse Sister   . Depression Brother    SOCIAL HISTORY: Social History   Socioeconomic History  . Marital status: Married    Spouse name: Not on file  . Number of children: Not on file  . Years of education: Not on file  . Highest education level: Not on file  Occupational History  . Not on file  Social Needs  . Financial resource strain: Not on file  . Food  insecurity:    Worry: Not on file    Inability: Not on file  . Transportation needs:    Medical: Not on file    Non-medical: Not on file  Tobacco Use  . Smoking status: Former Games developer  . Smokeless tobacco: Never Used  Substance and Sexual Activity  . Alcohol use: Yes  . Drug use: Not on file  . Sexual activity: Yes    Birth control/protection: Inserts  Lifestyle  . Physical activity:    Days per week: Not on file    Minutes per session: Not on file  . Stress: Not on file  Relationships  . Social connections:    Talks on phone: Not on file    Gets together: Not on file    Attends religious service: Not on file    Active member of club or organization: Not on file    Attends meetings of clubs or organizations: Not on file    Relationship  status: Not on file  . Intimate partner violence:    Fear of current or ex partner: Not on file    Emotionally abused: Not on file    Physically abused: Not on file    Forced sexual activity: Not on file  Other Topics Concern  . Not on file  Social History Narrative  . Not on file    REVIEW OF SYSTEMS: Constitutional: No fevers, chills, or sweats, no generalized fatigue, change in appetite Eyes: No visual changes, double vision, eye pain Ear, nose and throat: No hearing loss, ear pain, nasal congestion, sore throat Cardiovascular: No chest pain, palpitations Respiratory:  No shortness of breath at rest or with exertion, wheezes GastrointestinaI: No nausea, vomiting, diarrhea, abdominal pain, fecal incontinence Genitourinary:  No dysuria, urinary retention or frequency Musculoskeletal:  No neck pain, back pain Integumentary: No rash, pruritus, skin lesions Neurological: as above Psychiatric: No depression, insomnia, anxiety Endocrine: No palpitations, fatigue, diaphoresis, mood swings, change in appetite, change in weight, increased thirst Hematologic/Lymphatic:  No purpura, petechiae. Allergic/Immunologic: no itchy/runny eyes, nasal  congestion, recent allergic reactions, rashes  PHYSICAL EXAM: Blood pressure 96/62, pulse 83, height 5\' 5"  (1.651 m), weight 154 lb (69.9 kg), SpO2 97 %. General: No acute distress.  Patient appears well-groomed.   Head:  Normocephalic/atraumatic Eyes:  fundi examined but not visualized Neck: supple, no paraspinal tenderness, full range of motion Back: No paraspinal tenderness Heart: regular rate and rhythm Lungs: Clear to auscultation bilaterally. Vascular: No carotid bruits. Neurological Exam: Mental status: alert and oriented to person, place, and time, recent and remote memory intact, fund of knowledge intact, attention and concentration intact, speech fluent and not dysarthric, language intact. Cranial nerves: CN I: not tested CN II: pupils equal, round and reactive to light, visual fields intact CN III, IV, VI:  full range of motion, no nystagmus, no ptosis CN V: facial sensation intact CN VII: upper and lower face symmetric CN VIII: hearing intact CN IX, X: gag intact, uvula midline CN XI: sternocleidomastoid and trapezius muscles intact CN XII: tongue midline Bulk & Tone: normal, no fasciculations. Motor:  5/5 throughout  Sensation:  temperature and vibration sensation intact. Deep Tendon Reflexes:  2+ throughout, toes downgoing.   Finger to nose testing:  Without dysmetria.   Heel to shin:  Without dysmetria.   Gait:  Normal station and stride.  Romberg negative.  IMPRESSION: migraine with aura, without status migrainosus, not intractable  PLAN: 1.  For preventative management, continue topiramate 50mg  twice daily 2.  For abortive therapy, continue rizatriptan 10mg  earliest onset 3.  Limit use of pain relievers to no more than 2 days out of week to prevent risk of rebound or medication-overuse headache. 4.  Keep headache diary 5.  Exercise, hydration, caffeine cessation, sleep hygiene, monitor for and avoid triggers 6.  Consider:  magnesium citrate 400mg  daily,  riboflavin 400mg  daily, and coenzyme Q10 100mg  three times daily 7.  Follow up in 5 months   Thank you for allowing me to take part in the care of this patient.  Shon Millet, DO  CC: Olive Bass, FNP

## 2019-01-14 ENCOUNTER — Encounter: Payer: Self-pay | Admitting: Neurology

## 2019-01-14 ENCOUNTER — Ambulatory Visit (INDEPENDENT_AMBULATORY_CARE_PROVIDER_SITE_OTHER): Payer: BLUE CROSS/BLUE SHIELD | Admitting: Neurology

## 2019-01-14 VITALS — BP 96/62 | HR 83 | Ht 65.0 in | Wt 154.0 lb

## 2019-01-14 DIAGNOSIS — G43109 Migraine with aura, not intractable, without status migrainosus: Secondary | ICD-10-CM | POA: Diagnosis not present

## 2019-01-14 MED ORDER — ONDANSETRON 4 MG PO TBDP: 4.0000 mg | ORAL_TABLET | Freq: Three times a day (TID) | ORAL | 3 refills | Status: DC | PRN

## 2019-01-14 NOTE — Patient Instructions (Signed)
Migraine Recommendations: 1.  Topiramate 50mg  twice daily 2.  Take rizatriptan 10mg  at earliest onset of headache.  May repeat dose once in 2 hours if needed.  Do not exceed two tablets in 24 hours.  Use ondansetron (Zofran) for nausea 3.  Limit use of pain relievers to no more than 2 days out of the week.  These medications include acetaminophen, ibuprofen, triptans and narcotics.  This will help reduce risk of rebound headaches. 4.  Be aware of common food triggers such as processed sweets, processed foods with nitrites (such as deli meat, hot dogs, sausages), foods with MSG, alcohol (such as wine), chocolate, certain cheeses, certain fruits (dried fruits, bananas, pineapple), vinegar, diet soda. 4.  Avoid caffeine 5.  Routine exercise 6.  Proper sleep hygiene 7.  Stay adequately hydrated with water 8.  Keep a headache diary. 9.  Maintain proper stress management. 10.  Do not skip meals. 11.  Consider supplements:  Magnesium citrate 400mg  to 600mg  daily, riboflavin 400mg , Coenzyme Q 10 100mg  three times daily 12.  Follow up 5 months.

## 2019-02-09 ENCOUNTER — Encounter: Payer: Self-pay | Admitting: Family

## 2019-02-09 ENCOUNTER — Other Ambulatory Visit: Payer: Self-pay | Admitting: Family

## 2019-02-09 MED ORDER — OSELTAMIVIR PHOSPHATE 75 MG PO CAPS: 75.0000 mg | ORAL_CAPSULE | Freq: Every day | ORAL | 0 refills | Status: DC

## 2019-02-17 ENCOUNTER — Other Ambulatory Visit: Payer: Self-pay

## 2019-02-17 DIAGNOSIS — G43109 Migraine with aura, not intractable, without status migrainosus: Secondary | ICD-10-CM

## 2019-02-17 MED ORDER — ERENUMAB-AOOE 70 MG/ML ~~LOC~~ SOAJ: 70.0000 mg | SUBCUTANEOUS | 11 refills | Status: DC

## 2019-02-17 NOTE — Progress Notes (Signed)
aimovig sent to pharmacy

## 2019-02-19 ENCOUNTER — Encounter: Payer: Self-pay | Admitting: Family

## 2019-02-26 ENCOUNTER — Encounter: Payer: Self-pay | Admitting: Family

## 2019-02-26 ENCOUNTER — Other Ambulatory Visit: Payer: Self-pay | Admitting: Family

## 2019-03-02 ENCOUNTER — Other Ambulatory Visit: Payer: Self-pay | Admitting: Family

## 2019-03-02 ENCOUNTER — Encounter: Payer: Self-pay | Admitting: Family

## 2019-03-02 DIAGNOSIS — F411 Generalized anxiety disorder: Secondary | ICD-10-CM

## 2019-03-02 DIAGNOSIS — F329 Major depressive disorder, single episode, unspecified: Secondary | ICD-10-CM

## 2019-03-02 DIAGNOSIS — F32A Depression, unspecified: Secondary | ICD-10-CM

## 2019-03-02 MED ORDER — CITALOPRAM HYDROBROMIDE 40 MG PO TABS: 40.0000 mg | ORAL_TABLET | Freq: Every day | ORAL | 3 refills | Status: DC

## 2019-04-06 ENCOUNTER — Other Ambulatory Visit: Payer: Self-pay | Admitting: Family

## 2019-04-06 MED ORDER — RIZATRIPTAN BENZOATE 10 MG PO TABS: 10.0000 mg | ORAL_TABLET | ORAL | 1 refills | Status: DC | PRN

## 2019-04-14 DIAGNOSIS — M47812 Spondylosis without myelopathy or radiculopathy, cervical region: Secondary | ICD-10-CM | POA: Diagnosis not present

## 2019-04-14 DIAGNOSIS — G894 Chronic pain syndrome: Secondary | ICD-10-CM | POA: Diagnosis not present

## 2019-04-16 ENCOUNTER — Ambulatory Visit (INDEPENDENT_AMBULATORY_CARE_PROVIDER_SITE_OTHER): Payer: BLUE CROSS/BLUE SHIELD | Admitting: Psychology

## 2019-04-16 DIAGNOSIS — F330 Major depressive disorder, recurrent, mild: Secondary | ICD-10-CM | POA: Diagnosis not present

## 2019-04-16 DIAGNOSIS — F411 Generalized anxiety disorder: Secondary | ICD-10-CM | POA: Diagnosis not present

## 2019-04-17 DIAGNOSIS — M47892 Other spondylosis, cervical region: Secondary | ICD-10-CM | POA: Diagnosis not present

## 2019-04-17 DIAGNOSIS — Z87891 Personal history of nicotine dependence: Secondary | ICD-10-CM | POA: Diagnosis not present

## 2019-04-17 DIAGNOSIS — Z01818 Encounter for other preprocedural examination: Secondary | ICD-10-CM | POA: Diagnosis not present

## 2019-04-17 DIAGNOSIS — K5792 Diverticulitis of intestine, part unspecified, without perforation or abscess without bleeding: Secondary | ICD-10-CM | POA: Diagnosis not present

## 2019-04-17 DIAGNOSIS — K219 Gastro-esophageal reflux disease without esophagitis: Secondary | ICD-10-CM | POA: Diagnosis not present

## 2019-04-17 DIAGNOSIS — Z1159 Encounter for screening for other viral diseases: Secondary | ICD-10-CM | POA: Diagnosis not present

## 2019-04-20 ENCOUNTER — Ambulatory Visit: Payer: BLUE CROSS/BLUE SHIELD | Admitting: Psychology

## 2019-04-20 DIAGNOSIS — Z87891 Personal history of nicotine dependence: Secondary | ICD-10-CM | POA: Diagnosis not present

## 2019-04-20 DIAGNOSIS — Z91030 Bee allergy status: Secondary | ICD-10-CM | POA: Diagnosis not present

## 2019-04-20 DIAGNOSIS — M47812 Spondylosis without myelopathy or radiculopathy, cervical region: Secondary | ICD-10-CM | POA: Diagnosis not present

## 2019-04-20 DIAGNOSIS — F329 Major depressive disorder, single episode, unspecified: Secondary | ICD-10-CM | POA: Diagnosis not present

## 2019-04-20 DIAGNOSIS — K219 Gastro-esophageal reflux disease without esophagitis: Secondary | ICD-10-CM | POA: Diagnosis not present

## 2019-04-20 DIAGNOSIS — M797 Fibromyalgia: Secondary | ICD-10-CM | POA: Diagnosis not present

## 2019-04-20 DIAGNOSIS — K449 Diaphragmatic hernia without obstruction or gangrene: Secondary | ICD-10-CM | POA: Diagnosis not present

## 2019-04-20 DIAGNOSIS — Z888 Allergy status to other drugs, medicaments and biological substances status: Secondary | ICD-10-CM | POA: Diagnosis not present

## 2019-04-20 DIAGNOSIS — F419 Anxiety disorder, unspecified: Secondary | ICD-10-CM | POA: Diagnosis not present

## 2019-04-24 ENCOUNTER — Ambulatory Visit: Payer: BLUE CROSS/BLUE SHIELD | Admitting: Psychology

## 2019-04-27 ENCOUNTER — Ambulatory Visit (INDEPENDENT_AMBULATORY_CARE_PROVIDER_SITE_OTHER): Payer: BLUE CROSS/BLUE SHIELD | Admitting: Psychology

## 2019-04-27 DIAGNOSIS — F411 Generalized anxiety disorder: Secondary | ICD-10-CM

## 2019-04-27 DIAGNOSIS — F330 Major depressive disorder, recurrent, mild: Secondary | ICD-10-CM | POA: Diagnosis not present

## 2019-05-05 ENCOUNTER — Ambulatory Visit: Payer: BLUE CROSS/BLUE SHIELD | Admitting: Psychology

## 2019-05-08 DIAGNOSIS — Z01818 Encounter for other preprocedural examination: Secondary | ICD-10-CM | POA: Diagnosis not present

## 2019-05-11 DIAGNOSIS — M797 Fibromyalgia: Secondary | ICD-10-CM | POA: Diagnosis not present

## 2019-05-11 DIAGNOSIS — M47812 Spondylosis without myelopathy or radiculopathy, cervical region: Secondary | ICD-10-CM | POA: Diagnosis not present

## 2019-05-11 DIAGNOSIS — Z87891 Personal history of nicotine dependence: Secondary | ICD-10-CM | POA: Diagnosis not present

## 2019-05-11 DIAGNOSIS — K589 Irritable bowel syndrome without diarrhea: Secondary | ICD-10-CM | POA: Diagnosis not present

## 2019-05-11 DIAGNOSIS — K449 Diaphragmatic hernia without obstruction or gangrene: Secondary | ICD-10-CM | POA: Diagnosis not present

## 2019-05-11 DIAGNOSIS — F419 Anxiety disorder, unspecified: Secondary | ICD-10-CM | POA: Diagnosis not present

## 2019-05-11 DIAGNOSIS — Z91030 Bee allergy status: Secondary | ICD-10-CM | POA: Diagnosis not present

## 2019-05-11 DIAGNOSIS — F329 Major depressive disorder, single episode, unspecified: Secondary | ICD-10-CM | POA: Diagnosis not present

## 2019-05-11 DIAGNOSIS — Z888 Allergy status to other drugs, medicaments and biological substances status: Secondary | ICD-10-CM | POA: Diagnosis not present

## 2019-05-11 DIAGNOSIS — K219 Gastro-esophageal reflux disease without esophagitis: Secondary | ICD-10-CM | POA: Diagnosis not present

## 2019-05-11 DIAGNOSIS — N809 Endometriosis, unspecified: Secondary | ICD-10-CM | POA: Diagnosis not present

## 2019-05-19 ENCOUNTER — Ambulatory Visit (INDEPENDENT_AMBULATORY_CARE_PROVIDER_SITE_OTHER): Payer: BC Managed Care – PPO | Admitting: Psychology

## 2019-05-19 DIAGNOSIS — F411 Generalized anxiety disorder: Secondary | ICD-10-CM | POA: Diagnosis not present

## 2019-05-19 DIAGNOSIS — F330 Major depressive disorder, recurrent, mild: Secondary | ICD-10-CM | POA: Diagnosis not present

## 2019-05-25 ENCOUNTER — Encounter: Payer: Self-pay | Admitting: Family

## 2019-05-28 DIAGNOSIS — M792 Neuralgia and neuritis, unspecified: Secondary | ICD-10-CM | POA: Diagnosis not present

## 2019-05-28 DIAGNOSIS — G894 Chronic pain syndrome: Secondary | ICD-10-CM | POA: Diagnosis not present

## 2019-05-28 DIAGNOSIS — M47812 Spondylosis without myelopathy or radiculopathy, cervical region: Secondary | ICD-10-CM | POA: Diagnosis not present

## 2019-05-28 DIAGNOSIS — G43101 Migraine with aura, not intractable, with status migrainosus: Secondary | ICD-10-CM | POA: Diagnosis not present

## 2019-05-29 ENCOUNTER — Other Ambulatory Visit (INDEPENDENT_AMBULATORY_CARE_PROVIDER_SITE_OTHER): Payer: Self-pay

## 2019-05-29 ENCOUNTER — Other Ambulatory Visit: Payer: Self-pay

## 2019-05-29 ENCOUNTER — Encounter: Payer: Self-pay | Admitting: Family

## 2019-05-29 ENCOUNTER — Ambulatory Visit (INDEPENDENT_AMBULATORY_CARE_PROVIDER_SITE_OTHER): Payer: Self-pay | Admitting: Family

## 2019-05-29 VITALS — BP 104/70 | HR 77 | Temp 98.2°F | Ht 66.0 in | Wt 151.0 lb

## 2019-05-29 DIAGNOSIS — R29818 Other symptoms and signs involving the nervous system: Secondary | ICD-10-CM

## 2019-05-29 DIAGNOSIS — R55 Syncope and collapse: Secondary | ICD-10-CM

## 2019-05-29 LAB — COMPREHENSIVE METABOLIC PANEL
ALT: 17 U/L (ref 0–35)
AST: 14 U/L (ref 0–37)
Albumin: 4.1 g/dL (ref 3.5–5.2)
Alkaline Phosphatase: 41 U/L (ref 39–117)
BUN: 14 mg/dL (ref 6–23)
CO2: 21 mEq/L (ref 19–32)
Calcium: 9.3 mg/dL (ref 8.4–10.5)
Chloride: 110 mEq/L (ref 96–112)
Creatinine, Ser: 1.09 mg/dL (ref 0.40–1.20)
GFR: 53.77 mL/min — ABNORMAL LOW (ref >60.00)
Glucose, Bld: 90 mg/dL (ref 70–99)
Potassium: 4.3 mEq/L (ref 3.5–5.1)
Sodium: 141 mEq/L (ref 135–145)
Total Bilirubin: 0.2 mg/dL (ref 0.2–1.2)
Total Protein: 6.7 g/dL (ref 6.0–8.3)

## 2019-05-29 LAB — HEMOGLOBIN A1C: Hgb A1c MFr Bld: 5.2 % (ref 4.6–6.5)

## 2019-05-29 LAB — CBC WITH DIFFERENTIAL/PLATELET
Basophils Absolute: 0.1 10*3/uL (ref 0.0–0.1)
Basophils Relative: 1.4 % (ref 0.0–3.0)
Eosinophils Absolute: 0.2 10*3/uL (ref 0.0–0.7)
Eosinophils Relative: 2 % (ref 0.0–5.0)
HCT: 39.8 % (ref 36.0–46.0)
Hemoglobin: 13.3 g/dL (ref 12.0–15.0)
Lymphocytes Relative: 30.6 % (ref 12.0–46.0)
Lymphs Abs: 2.6 10*3/uL (ref 0.7–4.0)
MCHC: 33.3 g/dL (ref 30.0–36.0)
MCV: 95.4 fl (ref 78.0–100.0)
Monocytes Absolute: 0.7 10*3/uL (ref 0.1–1.0)
Monocytes Relative: 7.7 % (ref 3.0–12.0)
Neutro Abs: 4.9 10*3/uL (ref 1.4–7.7)
Neutrophils Relative %: 58.3 % (ref 43.0–77.0)
Platelets: 415 10*3/uL — ABNORMAL HIGH (ref 150.0–400.0)
RBC: 4.18 Mil/uL (ref 3.87–5.11)
RDW: 13.4 % (ref 11.5–15.5)
WBC: 8.5 10*3/uL (ref 4.0–10.5)

## 2019-05-29 LAB — TSH: TSH: 1.48 u[IU]/mL (ref 0.35–4.50)

## 2019-05-29 NOTE — Progress Notes (Signed)
Teresa Campbell is a 48 y.o. female with the following history as recorded in [employer]:  Patient Active Problem List   Diagnosis Date Noted  . Chronic migraine without aura without status migrainosus, not intractable 11/03/2018  . Depression 11/03/2018  . Gastroesophageal reflux disease 11/03/2018  . Chronic insomnia 11/03/2018  . ACUTE SINUSITIS, UNSPECIFIED 08/01/2011  . BRONCHITIS, ACUTE 08/01/2011    Current Outpatient Medications  Medication Sig Dispense Refill  . amitriptyline (ELAVIL) 25 MG tablet Take by mouth.    Marland Kitchen buPROPion (WELLBUTRIN XL) 300 MG 24 hr tablet Take 1 tablet (300 mg total) by mouth daily. 90 tablet 3  . citalopram (CELEXA) 40 MG tablet Take 1 tablet (40 mg total) by mouth daily. 90 tablet 3  . Erenumab-aooe (AIMOVIG) 70 MG/ML SOAJ Inject 70 mg into the skin every 30 (thirty) days. 1 pen 11  . HYDROcodone-acetaminophen (NORCO/VICODIN) 5-325 MG tablet TK 1 T PO Q 8 H PRN P FOR UP TO 30 DAYS  0  . Meclizine HCl 25 MG CHEW Chew by mouth.    Marland Kitchen NUVARING 0.12-0.015 MG/24HR vaginal ring     . omeprazole (PRILOSEC) 40 MG capsule Take 1 capsule (40 mg total) by mouth daily. 90 capsule 3  . ondansetron (ZOFRAN ODT) 4 MG disintegrating tablet Take 1 tablet (4 mg total) by mouth every 8 (eight) hours as needed for nausea or vomiting. 20 tablet 3  . rizatriptan (MAXALT) 10 MG tablet Take 1 tablet (10 mg total) by mouth as needed for migraine. May repeat in 2 hours if needed 54 tablet 1  . topiramate (TOPAMAX) 50 MG tablet TK 1 T HS X7 DAYS THEN 2 TS HS X7 DAYS THEN 1 T IN AM AND 2 T HS X7 DS THEN 2 TS BID  1  . traZODone (DESYREL) 100 MG tablet Take 1 tablet (100 mg total) by mouth at bedtime as needed for sleep. 90 tablet 3   No current facility-administered medications for this visit.     Allergies: Cymbalta [duloxetine hcl], Lyrica [pregabalin], and Tramadol  Past Medical History:  Diagnosis Date  . Colon polyps   . Depression   . Fibromyalgia   . GERD (gastroesophageal  reflux disease)   . Hemorrhoids   . Insomnia   . Migraine     History reviewed. No pertinent surgical history.  Family History  Problem Relation Age of Onset  . Arthritis Mother   . COPD Mother   . Depression Mother   . Hyperlipidemia Mother   . Miscarriages / [place] Mother   . Arthritis Father   . Depression Father   . Heart disease Father   . Hyperlipidemia Father   . Alcohol abuse Sister   . Depression Sister   . Drug abuse Sister   . Hyperlipidemia Sister   . Hypertension Sister   . Alcohol abuse Brother   . Depression Brother   . Drug abuse Brother   . Depression Daughter   . Alcohol abuse Maternal Grandmother   . Depression Maternal Grandmother   . Stroke Maternal Grandfather   . Arthritis Paternal Grandmother   . Diabetes Paternal Grandmother   . Stroke Paternal Grandfather   . Alcohol abuse Sister   . Depression Sister   . Drug abuse Sister   . Depression Brother     Social History   Tobacco Use  . Smoking status: Former Research scientist (life sciences)  . Smokeless tobacco: Never Used  Substance Use Topics  . Alcohol use: Yes    Subjective:  Patient has been having increased episodes of dizziness/ "feeling off balance"- like my knees are going to buckle; symptoms x 3 weeks; notes that recently at work, she felt like she had to hold on to the wall to keep from falling; had an episode in grocery store recently where she felt like she "was in a tunnel/ felt like she was going to pass out"- had felt good when she walked into the store and had sudden onset of these symptoms.  No new medications have been started recently; states that migraines have been stable; scheduled to see her neurologist next month. ? History of TIA 6 years ago but no known history of seizures; Does feel like her vision has been changing- scheduled to see eye doctor at the end of the month.     Objective:  Vitals:   05/29/19 0916  BP: 104/70  Pulse: 77  Temp: 98.2 F (36.8 C)  TempSrc: Oral  SpO2: 97%   Weight: 151 lb (68.5 kg)  Height: '5\' 6"'  (1.676 m)    General: Well developed, well nourished, in no acute distress  Skin : Warm and dry.  Head: Normocephalic and atraumatic  Eyes: Sclera and conjunctiva clear; pupils round and reactive to light; extraocular movements intact  Ears: External normal; canals clear; tympanic membranes normal  Neck: Supple without thyromegaly, adenopathy  Lungs: Respirations unlabored; clear to auscultation bilaterally without wheeze, rales, rhonchi  CVS exam: normal rate and regular rhythm.  Extremities: No edema, cyanosis, clubbing  Vessels: Symmetric bilaterally  Neurologic: Alert and oriented; speech intact; face symmetrical; moves all extremities well; CNII-XII intact without focal deficit   Assessment:  1. Pre-syncope   2. Other symptoms and signs involving the nervous system     Plan:  ? Etiology; ? Atypical migraine presentation vs vertigo; update labs and MRI; patient already has upcoming appointment with her neurologist next month- will most likely need to discuss further management with him.   No follow-ups on file.  Orders Placed This Encounter  Procedures  . MR Brain W Wo Contrast    Standing Status:   Future    Standing Expiration Date:   07/28/2020    Order Specific Question:   If indicated for the ordered procedure, I authorize the administration of contrast media per Radiology protocol    Answer:   Yes    Order Specific Question:   What is the patient's sedation requirement?    Answer:   No Sedation    Order Specific Question:   Does the patient have a pacemaker or implanted devices?    Answer:   No    Order Specific Question:   Radiology Contrast Protocol - do NOT remove file path    Answer:   [email or web address]    Order Specific Question:   Preferred imaging location?    Answer:   [facility] (table limit-550lbs)  . CBC w/Diff    Standing Status:   Future    Number of Occurrences:   1    Standing  Expiration Date:   05/28/2020  . Comp Met (CMET)    Standing Status:   Future    Number of Occurrences:   1    Standing Expiration Date:   05/28/2020  . TSH    Standing Status:   Future    Number of Occurrences:   1    Standing Expiration Date:   05/28/2020  . HgB A1c    Standing Status:   Future    Number  of Occurrences:   1    Standing Expiration Date:   05/28/2020    Requested Prescriptions    No prescriptions requested or ordered in this encounter

## 2019-06-07 NOTE — Progress Notes (Signed)
Virtual Visit via Video Note The purpose of this virtual visit is to provide medical care while limiting exposure to the novel coronavirus.    Consent was obtained for video visit:  Yes Answered questions that patient had about telehealth interaction:  Yes I discussed the limitations, risks, security and privacy concerns of performing an evaluation and management service by telemedicine. I also discussed with the patient that there may be a patient responsible charge related to this service. The patient expressed understanding and agreed to proceed.  Pt location: Home Physician Location: Home Name of referring provider:  Olive BassMurray, Laura Woodruff,* I connected with Teresa SosErika Kisiel at patients initiation/request on 06/08/2019 at  2:10 PM EDT by video enabled telemedicine application and verified that I am speaking with the correct person using two identifiers. Pt MRN:  [id number] Pt DOB:  07-08-1971 Video Participants:  Teresa SosErika Piercey   History of Present Illness:  Teresa Sosrika Campbell is a 48 year old right-handed woman with fibromyalgia and depression who follows up for migraines.  UPDATE: Intensity:  severe Duration:  1 hour  Frequency:  4 times in past month Current NSAIDS:  none Current analgesics: hydrocodone (fibromyalgia) Current triptans: Rizatriptan 10 mg Current ergotamine:  none Current anti-emetic:  none Current muscle relaxants:  none Current anti-anxiolytic:  none Current sleep aide: Trazodone Current Antihypertensive medications:  none Current Antidepressant medications: citalopram 40 mg daily, bupropion 150 mg Current Anticonvulsant medications: Topiramate 50 mg twice daily (on higher doses she had hair thinning, weight loss) Current anti-CGRP:  Aimovig 70mg  Current Vitamins/Herbal/Supplements:  none Current Antihistamines/Decongestants:  dramamine Other therapy:  cervical nerve ablations (helpful) Hormone/birth control: NuvaRing  Caffeine:  1 cup of coffee daily Diet:   Drinks water all day.  Eats healthy Exercise:  routine Depression:  yes; Anxiety:  yes Other pain:  fibromyalgia Sleep hygiene:  Okay with trazodone and exercise  3 weeks ago, she was in the grocery store when she started feeling dizzy.  She couldn't hear what the lady at check out was saying.  Everything was cloudy and her legs felt they were going to buckle.  She felt hot and diaphoretic.  She sat in her car with the air conditioner blowing, drank a water and ate a honey bun.  She felt better but was fatigued for the rest of the day.  She reports similar events in the past but not as severe since childhood.  She did have another episode the next day as well, less severe.  She drank orange juice and peanut butter crackers and it resolved.  No associated headaches.  She has had other events as well.  6 years ago, she was at work and she became confused.  She couldn't understand her coworkers and she was lethargic.  She was unable to communicate with them.  She went to the ED where testing was unremarkable.  She had a similar event about a year ago.  She is unsure if associated with headaches because she suffers from so many headaches.  HISTORY: Onset:  Childhood.  Missed school as a child.  Worse after starting her menstrual cycle. Location:  Bi-frontal Quality:  Pounding Initial Intensity:  Severe.  She denies new headache, thunderclap headache or severe headache that wakes her from sleep. Aura:  Flashing lights/blue colors Prodrome:  none Postdrome:  fatigue Associated symptoms:  Photophobia, phonophobia, nausea, osmophobia, sometimes vomiting.  She denies associated unilateral numbness or weakness. Initial Duration:  2 days with Maxalt (1 hour if taken at onset) Initial Frequency:  2  migraines past 30 days (15-17 a month prior to topiramate) Initial Frequency of abortive medication: 2 days past month Triggers: increased depression Relieving factors:  Ice pack on head and heat to back of  neck Activity:  aggravates  Past NSAIDS:  Ibuprofen, naproxen, Toradol shot Past analgesics:  Tylenol, Excedrin Past abortive triptans:Zomig, sumatriptan tablet Past abortive ergotamine:  none Past muscle relaxants:  none Past anti-emetic:  none Past antihypertensive medications:  none Past antidepressant medications:  Amitriptyline 25mg  Past anticonvulsant medications:  gabapentin Past anti-CGRP:  none Past vitamins/Herbal/Supplements:  none Past antihistamines/decongestants:  none Other past therapies:  Botox (effective.  However, she was having trouble getting the Botox due to cost), Gamacor vagus nerve stimulator (abortive treatment, effective)  Family history of headache:  Father, paternal grandfather, 2 aunts  Past Medical History: Past Medical History:  Diagnosis Date  . Colon polyps   . Depression   . Fibromyalgia   . GERD (gastroesophageal reflux disease)   . Hemorrhoids   . Insomnia   . Migraine     Medications: Outpatient Encounter Medications as of 06/08/2019  Medication Sig  . amitriptyline (ELAVIL) 25 MG tablet Take by mouth.  Marland Kitchen. buPROPion (WELLBUTRIN XL) 300 MG 24 hr tablet Take 1 tablet (300 mg total) by mouth daily.  . citalopram (CELEXA) 40 MG tablet Take 1 tablet (40 mg total) by mouth daily.  Dorise Hiss. Erenumab-aooe (AIMOVIG) 70 MG/ML SOAJ Inject 70 mg into the skin every 30 (thirty) days.  Marland Kitchen. HYDROcodone-acetaminophen (NORCO/VICODIN) 5-325 MG tablet TK 1 T PO Q 8 H PRN P FOR UP TO 30 DAYS  . Meclizine HCl 25 MG CHEW Chew by mouth.  Marland Kitchen. NUVARING 0.12-0.015 MG/24HR vaginal ring   . omeprazole (PRILOSEC) 40 MG capsule Take 1 capsule (40 mg total) by mouth daily.  . ondansetron (ZOFRAN ODT) 4 MG disintegrating tablet Take 1 tablet (4 mg total) by mouth every 8 (eight) hours as needed for nausea or vomiting.  . rizatriptan (MAXALT) 10 MG tablet Take 1 tablet (10 mg total) by mouth as needed for migraine. May repeat in 2 hours if needed  . topiramate (TOPAMAX) 50 MG  tablet TK 1 T HS X7 DAYS THEN 2 TS HS X7 DAYS THEN 1 T IN AM AND 2 T HS X7 DS THEN 2 TS BID  . traZODone (DESYREL) 100 MG tablet Take 1 tablet (100 mg total) by mouth at bedtime as needed for sleep.   No facility-administered encounter medications on file as of 06/08/2019.     Allergies: Allergies  Allergen Reactions  . Cymbalta [Duloxetine Hcl]     Ineffective  . Lyrica [Pregabalin] Other (See Comments)    Ineffective  . Tramadol     Ineffective     Family History: Family History  Problem Relation Age of Onset  . Arthritis Mother   . COPD Mother   . Depression Mother   . Hyperlipidemia Mother   . Miscarriages / [place] Mother   . Arthritis Father   . Depression Father   . Heart disease Father   . Hyperlipidemia Father   . Alcohol abuse Sister   . Depression Sister   . Drug abuse Sister   . Hyperlipidemia Sister   . Hypertension Sister   . Alcohol abuse Brother   . Depression Brother   . Drug abuse Brother   . Depression Daughter   . Alcohol abuse Maternal Grandmother   . Depression Maternal Grandmother   . Stroke Maternal Grandfather   . Arthritis Paternal Grandmother   .  Diabetes Paternal Grandmother   . Stroke Paternal Grandfather   . Alcohol abuse Sister   . Depression Sister   . Drug abuse Sister   . Depression Brother     Social History: Social History   Socioeconomic History  . Marital status: Married    Spouse name: Dellis Filbert  . Number of children: 1  . Years of education: Not on file  . Highest education level: Bachelor's degree (e.g., BA, AB, BS)  Occupational History  . Occupation: Building control surveyor: [employer]  . Financial resource strain: Not on file  . Food insecurity    Worry: Not on file    Inability: Not on file  . Transportation needs    Medical: Not on file    Non-medical: Not on file  Tobacco Use  . Smoking status: Former Research scientist (life sciences)  . Smokeless tobacco: Never Used  Substance and Sexual  Activity  . Alcohol use: Yes  . Drug use: Not on file  . Sexual activity: Yes    Birth control/protection: Inserts  Lifestyle  . Physical activity    Days per week: Not on file    Minutes per session: Not on file  . Stress: Not on file  Relationships  . Social Herbalist on phone: Not on file    Gets together: Not on file    Attends religious service: Not on file    Active member of club or organization: Not on file    Attends meetings of clubs or organizations: Not on file    Relationship status: Not on file  . Intimate partner violence    Fear of current or ex partner: Not on file    Emotionally abused: Not on file    Physically abused: Not on file    Forced sexual activity: Not on file  Other Topics Concern  . Not on file  Social History Narrative   Patient is right-handed. She lives with her husband in a one level home with a basement. She drinks one cup of coffee a day. She goes to [employer] daily.   Observations/Objective:   Blood pressure 131/78, temperature 97.8 F (36.6 C), height 5' 5.5" (1.664 m), weight 151 lb (68.5 kg). No acute distress.  Alert and oriented.  Speech fluent and not dysarthric.  Language intact.  Face symmetric.    Assessment and Plan:   1.  Migraine with aura, without status migrainosus, not intractable 2.  Recurrent transient spells.  Unclear if due to hypoglycemia or possibly migraine.  Less likely seizure  1.  For preventative management, topiramate 50mg  twice daily and Aimovig 70mg  monthly 2.  For abortive therapy, rizatriptan 10mg  3. Check EEG.  She has MRI of brain ordered by her PCP. 4.  Limit use of pain relievers to no more than 2 days out of week to prevent risk of rebound or medication-overuse headache. 5.  Keep headache diary 6.  Exercise, hydration, caffeine cessation, sleep hygiene, monitor for and avoid triggers 7.  Consider:  magnesium citrate 400mg  daily, riboflavin 400mg  daily, and coenzyme Q10 100mg  three  times daily 8. Always keep in mind that currently taking a hormone or birth control may be a possible trigger or aggravating factor for migraine. 9. Follow up 6 months   Follow Up Instructions:    -I discussed the assessment and treatment plan with the patient. The patient was provided an opportunity to ask questions and all were answered. The patient agreed  with the plan and demonstrated an understanding of the instructions.   The patient was advised to call back or seek an in-person evaluation if the symptoms worsen or if the condition fails to improve as anticipated.    Cira ServantAdam Robert Jaffe, DO

## 2019-06-08 ENCOUNTER — Encounter: Payer: Self-pay | Admitting: Neurology

## 2019-06-08 ENCOUNTER — Other Ambulatory Visit: Payer: Self-pay

## 2019-06-08 ENCOUNTER — Telehealth (INDEPENDENT_AMBULATORY_CARE_PROVIDER_SITE_OTHER): Payer: Self-pay | Admitting: Neurology

## 2019-06-08 ENCOUNTER — Encounter: Payer: Self-pay | Admitting: Family

## 2019-06-08 VITALS — BP 131/78 | Temp 97.8°F | Ht 65.5 in | Wt 151.0 lb

## 2019-06-08 DIAGNOSIS — G43109 Migraine with aura, not intractable, without status migrainosus: Secondary | ICD-10-CM

## 2019-06-08 DIAGNOSIS — R404 Transient alteration of awareness: Secondary | ICD-10-CM

## 2019-06-08 NOTE — Addendum Note (Signed)
Addended by: Clois Comber on: 06/08/2019 02:57 PM   Modules accepted: Orders

## 2019-06-09 ENCOUNTER — Ambulatory Visit: Payer: BC Managed Care – PPO | Admitting: Psychology

## 2019-06-15 ENCOUNTER — Telehealth: Payer: Self-pay | Admitting: *Deleted

## 2019-06-15 NOTE — Telephone Encounter (Signed)
VMOM to call us back to set up EEG appointment Dr. Tomi Likens ordered for her.

## 2019-06-16 ENCOUNTER — Ambulatory Visit: Payer: BLUE CROSS/BLUE SHIELD | Admitting: Neurology

## 2019-06-22 DIAGNOSIS — M797 Fibromyalgia: Secondary | ICD-10-CM | POA: Diagnosis not present

## 2019-06-22 DIAGNOSIS — G894 Chronic pain syndrome: Secondary | ICD-10-CM | POA: Diagnosis not present

## 2019-06-22 DIAGNOSIS — G43101 Migraine with aura, not intractable, with status migrainosus: Secondary | ICD-10-CM | POA: Diagnosis not present

## 2019-06-24 ENCOUNTER — Ambulatory Visit: Payer: BC Managed Care – PPO | Admitting: Psychology

## 2019-06-24 DIAGNOSIS — G43101 Migraine with aura, not intractable, with status migrainosus: Secondary | ICD-10-CM | POA: Diagnosis not present

## 2019-06-24 DIAGNOSIS — G894 Chronic pain syndrome: Secondary | ICD-10-CM | POA: Diagnosis not present

## 2019-06-24 DIAGNOSIS — M797 Fibromyalgia: Secondary | ICD-10-CM | POA: Diagnosis not present

## 2019-06-24 DIAGNOSIS — M47812 Spondylosis without myelopathy or radiculopathy, cervical region: Secondary | ICD-10-CM | POA: Diagnosis not present

## 2019-07-01 ENCOUNTER — Other Ambulatory Visit: Payer: Self-pay

## 2019-07-01 ENCOUNTER — Ambulatory Visit (INDEPENDENT_AMBULATORY_CARE_PROVIDER_SITE_OTHER): Payer: BC Managed Care – PPO | Admitting: Neurology

## 2019-07-01 DIAGNOSIS — R404 Transient alteration of awareness: Secondary | ICD-10-CM

## 2019-07-07 ENCOUNTER — Telehealth: Payer: Self-pay

## 2019-07-07 NOTE — Procedures (Signed)
ELECTROENCEPHALOGRAM REPORT  Date of Study: 07/01/2019  Patient's Name: Teresa Campbell MRN: [id number] Date of Birth: July 03, 1971   Clinical History: 48 year old woman with migraines and transient spells.  Medications: Amitriptyline Citalopram Aimovig Norco/Vicodin topiramate rizatriptan  Technical Summary: A multichannel digital EEG recording measured by the international 10-20 system with electrodes applied with paste and impedances below 5000 ohms performed in our laboratory with EKG monitoring in an awake and asleep patient.  Hyperventilation was not performed due to wearing mask for COVID.  Photic stimulation was performed.  The digital EEG was referentially recorded, reformatted, and digitally filtered in a variety of bipolar and referential montages for optimal display.    Description: The patient is awake and asleep during the recording.  During maximal wakefulness, there is a symmetric, medium voltage 10 Hz posterior dominant rhythm that attenuates with eye opening.  The record is symmetric.  During drowsiness and sleep, there is an increase in theta slowing of the background.  Vertex waves and symmetric sleep spindles were seen.  Photic stimulation did not elicit any abnormalities.  There were no epileptiform discharges or electrographic seizures seen.    EKG lead was unremarkable.  Impression: This awake and asleep EEG is normal.    Clinical Correlation: A normal EEG does not exclude a clinical diagnosis of epilepsy.  If further clinical questions remain, prolonged EEG may be helpful.  Clinical correlation is advised.   Metta Clines, DO

## 2019-07-07 NOTE — Telephone Encounter (Signed)
Called spoke with patient she was made aware of results 

## 2019-07-07 NOTE — Telephone Encounter (Signed)
-----   Message from Pieter Partridge, DO sent at 07/07/2019 12:14 PM EDT ----- EEG is normal

## 2019-07-09 ENCOUNTER — Ambulatory Visit
Admission: RE | Admit: 2019-07-09 | Discharge: 2019-07-09 | Disposition: A | Discharge status: Home / Self Care | Payer: BC Managed Care – PPO | Source: Ambulatory Visit | Attending: Family | Admitting: Family

## 2019-07-09 DIAGNOSIS — R410 Disorientation, unspecified: Secondary | ICD-10-CM | POA: Diagnosis not present

## 2019-07-09 DIAGNOSIS — R29818 Other symptoms and signs involving the nervous system: Secondary | ICD-10-CM

## 2019-07-09 DIAGNOSIS — R51 Headache: Secondary | ICD-10-CM | POA: Diagnosis not present

## 2019-07-09 DIAGNOSIS — R55 Syncope and collapse: Secondary | ICD-10-CM | POA: Diagnosis not present

## 2019-07-09 MED ORDER — GADOBENATE DIMEGLUMINE 529 MG/ML IV SOLN
14.0000 mL | Freq: Once | INTRAVENOUS | Status: AC | PRN
  Administered 2019-07-09: 14 mL via INTRAVENOUS

## 2019-07-14 ENCOUNTER — Ambulatory Visit (INDEPENDENT_AMBULATORY_CARE_PROVIDER_SITE_OTHER): Payer: BC Managed Care – PPO | Admitting: Psychology

## 2019-07-14 DIAGNOSIS — F411 Generalized anxiety disorder: Secondary | ICD-10-CM

## 2019-07-14 DIAGNOSIS — F330 Major depressive disorder, recurrent, mild: Secondary | ICD-10-CM

## 2019-08-12 ENCOUNTER — Ambulatory Visit (INDEPENDENT_AMBULATORY_CARE_PROVIDER_SITE_OTHER): Payer: BC Managed Care – PPO | Admitting: Psychology

## 2019-08-12 DIAGNOSIS — F411 Generalized anxiety disorder: Secondary | ICD-10-CM | POA: Diagnosis not present

## 2019-08-12 DIAGNOSIS — F330 Major depressive disorder, recurrent, mild: Secondary | ICD-10-CM | POA: Diagnosis not present

## 2019-08-20 ENCOUNTER — Encounter: Payer: Self-pay | Admitting: Family

## 2019-08-21 ENCOUNTER — Other Ambulatory Visit: Payer: Self-pay | Admitting: Family

## 2019-08-21 MED ORDER — CEFUROXIME AXETIL 500 MG PO TABS: 500.0000 mg | ORAL_TABLET | Freq: Two times a day (BID) | ORAL | 0 refills | Status: DC

## 2019-08-31 ENCOUNTER — Encounter: Payer: Self-pay | Admitting: Neurology

## 2019-09-10 DIAGNOSIS — G43101 Migraine with aura, not intractable, with status migrainosus: Secondary | ICD-10-CM | POA: Diagnosis not present

## 2019-09-10 DIAGNOSIS — M797 Fibromyalgia: Secondary | ICD-10-CM | POA: Diagnosis not present

## 2019-09-10 DIAGNOSIS — M47812 Spondylosis without myelopathy or radiculopathy, cervical region: Secondary | ICD-10-CM | POA: Diagnosis not present

## 2019-09-10 DIAGNOSIS — G894 Chronic pain syndrome: Secondary | ICD-10-CM | POA: Diagnosis not present

## 2019-09-12 ENCOUNTER — Ambulatory Visit: Payer: BC Managed Care – PPO | Admitting: Psychology

## 2019-09-16 ENCOUNTER — Ambulatory Visit (INDEPENDENT_AMBULATORY_CARE_PROVIDER_SITE_OTHER): Payer: BC Managed Care – PPO | Admitting: Psychology

## 2019-09-16 DIAGNOSIS — F411 Generalized anxiety disorder: Secondary | ICD-10-CM

## 2019-09-16 DIAGNOSIS — F330 Major depressive disorder, recurrent, mild: Secondary | ICD-10-CM | POA: Diagnosis not present

## 2019-09-22 ENCOUNTER — Other Ambulatory Visit: Payer: Self-pay

## 2019-09-22 ENCOUNTER — Encounter: Payer: Self-pay | Admitting: Family

## 2019-09-22 ENCOUNTER — Ambulatory Visit (INDEPENDENT_AMBULATORY_CARE_PROVIDER_SITE_OTHER): Payer: BC Managed Care – PPO | Admitting: Family

## 2019-09-22 DIAGNOSIS — J019 Acute sinusitis, unspecified: Secondary | ICD-10-CM | POA: Diagnosis not present

## 2019-09-22 DIAGNOSIS — J988 Other specified respiratory disorders: Secondary | ICD-10-CM | POA: Diagnosis not present

## 2019-09-22 DIAGNOSIS — B9789 Other viral agents as the cause of diseases classified elsewhere: Secondary | ICD-10-CM | POA: Diagnosis not present

## 2019-09-22 DIAGNOSIS — Z20822 Contact with and (suspected) exposure to covid-19: Secondary | ICD-10-CM

## 2019-09-22 MED ORDER — AMOXICILLIN-POT CLAVULANATE 875-125 MG PO TABS: 1.0000 | ORAL_TABLET | Freq: Two times a day (BID) | ORAL | 0 refills | Status: DC

## 2019-09-22 MED ORDER — FLUTICASONE PROPIONATE 50 MCG/ACT NA SUSP: 2.0000 | Freq: Every day | NASAL | 6 refills | Status: DC

## 2019-09-22 NOTE — Progress Notes (Signed)
Teresa Campbell is a 48 y.o. female with the following history as recorded in [employer]:  Patient Active Problem List   Diagnosis Date Noted  . Chronic migraine without aura without status migrainosus, not intractable 11/03/2018  . Depression 11/03/2018  . Gastroesophageal reflux disease 11/03/2018  . Chronic insomnia 11/03/2018  . Mild episode of recurrent major depressive disorder (HCC) 06/05/2018  . Spondylosis without myelopathy or radiculopathy, thoracic region 02/03/2018  . Hiatal hernia with GERD without esophagitis 11/25/2017  . Spondylosis without myelopathy or radiculopathy, cervical region 07/03/2016  . Neck pain 12/27/2015  . Migraine with aura and with status migrainosus, not intractable 04/19/2015  . Persistent headaches 01/24/2014  . Chronic pain syndrome 01/24/2014  . ACUTE SINUSITIS, UNSPECIFIED 08/01/2011  . BRONCHITIS, ACUTE 08/01/2011    Current Outpatient Medications  Medication Sig Dispense Refill  . amoxicillin-clavulanate (AUGMENTIN) 875-125 MG tablet Take 1 tablet by mouth 2 (two) times daily. 20 tablet 0  . buPROPion (WELLBUTRIN XL) 300 MG 24 hr tablet Take 1 tablet (300 mg total) by mouth daily. 90 tablet 3  . citalopram (CELEXA) 40 MG tablet Take 1 tablet (40 mg total) by mouth daily. 90 tablet 3  . Erenumab-aooe (AIMOVIG) 70 MG/ML SOAJ Inject 70 mg into the skin every 30 (thirty) days. 1 pen 11  . fluticasone (FLONASE) 50 MCG/ACT nasal spray Place 2 sprays into both nostrils daily. 16 g 6  . HYDROcodone-acetaminophen (NORCO/VICODIN) 5-325 MG tablet TK 1 T PO Q 8 H PRN P FOR UP TO 30 DAYS  0  . Meclizine HCl 25 MG CHEW Chew by mouth.    Marland Kitchen NUVARING 0.12-0.015 MG/24HR vaginal ring     . omeprazole (PRILOSEC) 40 MG capsule Take 1 capsule (40 mg total) by mouth daily. 90 capsule 3  . ondansetron (ZOFRAN ODT) 4 MG disintegrating tablet Take 1 tablet (4 mg total) by mouth every 8 (eight) hours as needed for nausea or vomiting. 20 tablet 3  . rizatriptan (MAXALT) 10  MG tablet Take 1 tablet (10 mg total) by mouth as needed for migraine. May repeat in 2 hours if needed 54 tablet 1  . topiramate (TOPAMAX) 50 MG tablet TK 1 T HS X7 DAYS THEN 2 TS HS X7 DAYS THEN 1 T IN AM AND 2 T HS X7 DS THEN 2 TS BID  1  . traZODone (DESYREL) 100 MG tablet Take 1 tablet (100 mg total) by mouth at bedtime as needed for sleep. 90 tablet 3   No current facility-administered medications for this visit.     Allergies: Cymbalta [duloxetine hcl], Lyrica [pregabalin], and Tramadol  Past Medical History:  Diagnosis Date  . Colon polyps   . Depression   . Fibromyalgia   . GERD (gastroesophageal reflux disease)   . Hemorrhoids   . Insomnia   . Migraine     No past surgical history on file.  Family History  Problem Relation Age of Onset  . Arthritis Mother   . COPD Mother   . Depression Mother   . Hyperlipidemia Mother   . Miscarriages / [place] Mother   . Arthritis Father   . Depression Father   . Heart disease Father   . Hyperlipidemia Father   . Alcohol abuse Sister   . Depression Sister   . Drug abuse Sister   . Hyperlipidemia Sister   . Hypertension Sister   . Alcohol abuse Brother   . Depression Brother   . Drug abuse Brother   . Depression Daughter   .  Alcohol abuse Maternal Grandmother   . Depression Maternal Grandmother   . Stroke Maternal Grandfather   . Arthritis Paternal Grandmother   . Diabetes Paternal Grandmother   . Stroke Paternal Grandfather   . Alcohol abuse Sister   . Depression Sister   . Drug abuse Sister   . Depression Brother     Social History   Tobacco Use  . Smoking status: Former Research scientist (life sciences)  . Smokeless tobacco: Never Used  Substance Use Topics  . Alcohol use: Yes    Subjective:    I connected with Teresa Campbell on 09/22/19 at  8:40 AM EDT by a video enabled telemedicine application and verified that I am speaking with the correct person using two identifiers. Provider is in office, patient is at home; provider and patient  only 2 people on video call.    I discussed the limitations of evaluation and management by telemedicine and the availability of in person appointments. The patient expressed understanding and agreed to proceed.  3-4 day history of ear pain/ teeth pain; + sinus pain/ pressure- dull headache; notes that she was recently at a funeral and admits they did not wear masks/ social distance; co-workers are adamant that she be tested for COVID before being able to return to work.    Objective:  There were no vitals filed for this visit.  General: Well developed, well nourished, in no acute distress  Head: Normocephalic and atraumatic  Lungs: Respirations unlabored;  Neurologic: Alert and oriented; speech intact; face symmetrical; moves all extremities well; CNII-XII intact without focal deficit   Assessment:  1. Acute sinusitis, recurrence not specified, unspecified location   2. Viral respiratory illness     Plan:  Suspect sinus infection is cause of symptoms and will treat accordingly with Flonase and Augmentin; however, will check for COVID to be sure not secondary source of symptoms; she agrees to quarantine/ work from home until test results are available; follow up to be determined.   No follow-ups on file.  Orders Placed This Encounter  Procedures  . Novel Coronavirus, NAA ([facility])    Standing Status:   Future    Standing Expiration Date:   09/21/2020    Order Specific Question:   Is this test for diagnosis or screening    Answer:   Diagnosis of ill patient    Order Specific Question:   Symptomatic for COVID-19 as defined by CDC    Answer:   Yes    Order Specific Question:   Date of Symptom Onset    Answer:   09/21/2019    Order Specific Question:   Hospitalized for COVID-19    Answer:   No    Order Specific Question:   Admitted to ICU for COVID-19    Answer:   No    Order Specific Question:   Previously tested for COVID-19    Answer:   No    Order Specific Question:   Resident  in a congregate (group) care setting    Answer:   No    Order Specific Question:   Is the patient student?    Answer:   No    Order Specific Question:   Employed in healthcare setting    Answer:   No    Order Specific Question:   Pregnant    Answer:   No    Requested Prescriptions   Signed Prescriptions Disp Refills  . fluticasone (FLONASE) 50 MCG/ACT nasal spray 16 g 6    Sig:  Place 2 sprays into both nostrils daily.  Marland Kitchen. amoxicillin-clavulanate (AUGMENTIN) 875-125 MG tablet 20 tablet 0    Sig: Take 1 tablet by mouth 2 (two) times daily.

## 2019-09-23 LAB — NOVEL CORONAVIRUS, NAA: SARS-CoV-2, NAA: NOT DETECTED

## 2019-10-01 ENCOUNTER — Ambulatory Visit (INDEPENDENT_AMBULATORY_CARE_PROVIDER_SITE_OTHER): Payer: BC Managed Care – PPO | Admitting: Psychology

## 2019-10-01 DIAGNOSIS — F330 Major depressive disorder, recurrent, mild: Secondary | ICD-10-CM | POA: Diagnosis not present

## 2019-10-01 DIAGNOSIS — F411 Generalized anxiety disorder: Secondary | ICD-10-CM | POA: Diagnosis not present

## 2019-10-07 ENCOUNTER — Other Ambulatory Visit: Payer: Self-pay | Admitting: Family

## 2019-10-20 ENCOUNTER — Ambulatory Visit (INDEPENDENT_AMBULATORY_CARE_PROVIDER_SITE_OTHER): Payer: BC Managed Care – PPO | Admitting: Psychology

## 2019-10-20 DIAGNOSIS — F411 Generalized anxiety disorder: Secondary | ICD-10-CM

## 2019-10-20 DIAGNOSIS — F330 Major depressive disorder, recurrent, mild: Secondary | ICD-10-CM | POA: Diagnosis not present

## 2019-10-26 DIAGNOSIS — Z5181 Encounter for therapeutic drug level monitoring: Secondary | ICD-10-CM | POA: Diagnosis not present

## 2019-10-26 DIAGNOSIS — M797 Fibromyalgia: Secondary | ICD-10-CM | POA: Diagnosis not present

## 2019-10-26 DIAGNOSIS — G894 Chronic pain syndrome: Secondary | ICD-10-CM | POA: Diagnosis not present

## 2019-10-26 DIAGNOSIS — M792 Neuralgia and neuritis, unspecified: Secondary | ICD-10-CM | POA: Diagnosis not present

## 2019-10-26 DIAGNOSIS — Z79899 Other long term (current) drug therapy: Secondary | ICD-10-CM | POA: Diagnosis not present

## 2019-10-26 DIAGNOSIS — G43101 Migraine with aura, not intractable, with status migrainosus: Secondary | ICD-10-CM | POA: Diagnosis not present

## 2019-10-27 NOTE — Progress Notes (Signed)
NEUROLOGY FOLLOW UP OFFICE NOTE  Tobe Sosrika Winrow [id number]  HISTORY OF PRESENT ILLNESS: Lara Mulchrika Larsonis a 48 year old right-handed woman with fibromyalgia and depression who follows up for migraines.  UPDATE: Migraines have been worse since her brother passed away unexpectedly 2 months ago.  She has had trouble with focus, panic attacks and tremulous.  She wonders if topiramate may be contributing. Seeing a therapist and psychiatrist.   Current NSAIDS:none Current analgesics:hydrocodone (fibromyalgia) Current triptans:Rizatriptan 10 mg Current ergotamine:none Current anti-emetic:none Current muscle relaxants:none Current anti-anxiolytic:none Current sleep aide:Trazodone Current Antihypertensive medications:none Current Antidepressant medications:citalopram 40 mg daily, bupropion 150 mg Current Anticonvulsant medications:Topiramate 50mg  twice daily (on higher doses she had hair thinning, weight loss) Current anti-CGRP:Aimovig 70mg  Current Vitamins/Herbal/Supplements:none Current Antihistamines/Decongestants:dramamine Other therapy:cervical nerve ablations (helpful) Hormone/birth control: NuvaRing  Caffeine:1 cup of coffee daily Diet:Drinks water all day. Eats healthy Exercise: routine Depression:yes; Anxiety:yes Other pain:fibromyalgia Sleep hygiene:Okay with trazodone and exercise  She had workup to evaluate spells. Awake and asleep EEG from 07/01/2019 was normal. MRI of brain with and without contrast from 07/10/2019 was normal  HISTORY: Migraines: Onset:  Childhood. Missed school as a child. Worse after starting her menstrual cycle. Location:Bi-frontal Quality:Pounding Initial Intensity:Severe.Shedenies new headache, thunderclap headache or severe headache that wakes herfrom sleep. Aura:Flashing lights/blue colors Prodrome:none Postdrome:fatigue Associated symptoms:  Photophobia, phonophobia, nausea,  osmophobia, sometimes vomiting.Shedenies associated unilateral numbness or weakness. Initial Duration:2 days with Maxalt (1 hour if taken at onset) Initial Frequency:2 migraines past 30 days (15-17 a month prior to topiramate) Initial Frequency of abortive medication:2 days past month Triggers: increased depression Relieving factors:  Ice pack on head and heat to back of neck Activity:aggravates  Past NSAIDS:Ibuprofen, naproxen, Toradol shot Past analgesics:Tylenol, Excedrin Past abortive triptans:Zomig, sumatriptan tablet Past abortive ergotamine:none Past muscle relaxants:none Past anti-emetic:none Past antihypertensive medications:none Past antidepressant medications:Amitriptyline 25mg  Past anticonvulsant medications:gabapentin Past anti-CGRP:none Past vitamins/Herbal/Supplements:none Past antihistamines/decongestants:none Other past therapies:Botox (effective. However, she was having trouble getting the Botox due to cost), Gamacor vagus nerve stimulator (abortive treatment, effective)  Family history of headache:Father, paternal grandfather, 2 aunts  Transient Spells: In June 2020, she was in the grocery store when she started feeling dizzy.  She couldn't hear what the lady at check out was saying.  Everything was cloudy and her legs felt they were going to buckle.  She felt hot and diaphoretic.  She sat in her car with the air conditioner blowing, drank a water and ate a honey bun.  She felt better but was fatigued for the rest of the day.  She reports similar events in the past but not as severe since childhood.  She did have another episode the next day as well, less severe.  She drank orange juice and peanut butter crackers and it resolved.  No associated headaches.  She has had other events as well.  When she was around 41, she was at work and she became confused.  She couldn't understand her coworkers and she was lethargic.  She was unable to  communicate with them.  She went to the ED where testing was unremarkable.  She had a similar event about a year ago.  She is unsure if associated with headaches because she suffers from so many headaches.  PAST MEDICAL HISTORY: Past Medical History:  Diagnosis Date  . Colon polyps   . Depression   . Fibromyalgia   . GERD (gastroesophageal reflux disease)   . Hemorrhoids   . Insomnia   . Migraine     MEDICATIONS: Current Outpatient  Medications on File Prior to Visit  Medication Sig Dispense Refill  . amoxicillin-clavulanate (AUGMENTIN) 875-125 MG tablet Take 1 tablet by mouth 2 (two) times daily. 20 tablet 0  . buPROPion (WELLBUTRIN XL) 300 MG 24 hr tablet Take 1 tablet (300 mg total) by mouth daily. 90 tablet 3  . citalopram (CELEXA) 40 MG tablet Take 1 tablet (40 mg total) by mouth daily. 90 tablet 3  . Erenumab-aooe (AIMOVIG) 70 MG/ML SOAJ Inject 70 mg into the skin every 30 (thirty) days. 1 pen 11  . fluticasone (FLONASE) 50 MCG/ACT nasal spray Place 2 sprays into both nostrils daily. 16 g 6  . HYDROcodone-acetaminophen (NORCO/VICODIN) 5-325 MG tablet TK 1 T PO Q 8 H PRN P FOR UP TO 30 DAYS  0  . Meclizine HCl 25 MG CHEW Chew by mouth.    Marland Kitchen NUVARING 0.12-0.015 MG/24HR vaginal ring     . omeprazole (PRILOSEC) 40 MG capsule Take 1 capsule (40 mg total) by mouth daily. 90 capsule 3  . ondansetron (ZOFRAN ODT) 4 MG disintegrating tablet Take 1 tablet (4 mg total) by mouth every 8 (eight) hours as needed for nausea or vomiting. 20 tablet 3  . rizatriptan (MAXALT) 10 MG tablet TAKE 1 TABLET AS NEEDED FORMIGRAINE MAY REPEAT IN 2   HOURS IF NEEDED 54 tablet 1  . topiramate (TOPAMAX) 50 MG tablet TK 1 T HS X7 DAYS THEN 2 TS HS X7 DAYS THEN 1 T IN AM AND 2 T HS X7 DS THEN 2 TS BID  1  . traZODone (DESYREL) 100 MG tablet Take 1 tablet (100 mg total) by mouth at bedtime as needed for sleep. 90 tablet 3   No current facility-administered medications on file prior to visit.     ALLERGIES:  Allergies  Allergen Reactions  . Cymbalta [Duloxetine Hcl]     Ineffective  . Lyrica [Pregabalin] Other (See Comments)    Ineffective  . Tramadol     Ineffective     FAMILY HISTORY: Family History  Problem Relation Age of Onset  . Arthritis Mother   . COPD Mother   . Depression Mother   . Hyperlipidemia Mother   . Miscarriages / [place] Mother   . Arthritis Father   . Depression Father   . Heart disease Father   . Hyperlipidemia Father   . Alcohol abuse Sister   . Depression Sister   . Drug abuse Sister   . Hyperlipidemia Sister   . Hypertension Sister   . Alcohol abuse Brother   . Depression Brother   . Drug abuse Brother   . Depression Daughter   . Alcohol abuse Maternal Grandmother   . Depression Maternal Grandmother   . Stroke Maternal Grandfather   . Arthritis Paternal Grandmother   . Diabetes Paternal Grandmother   . Stroke Paternal Grandfather   . Alcohol abuse Sister   . Depression Sister   . Drug abuse Sister   . Depression Brother    SOCIAL HISTORY: Social History   Socioeconomic History  . Marital status: Married    Spouse name: Dellis Filbert  . Number of children: 1  . Years of education: Not on file  . Highest education level: Bachelor's degree (e.g., BA, AB, BS)  Occupational History  . Occupation: Building control surveyor: [employer]  . Financial resource strain: Not on file  . Food insecurity    Worry: Not on file    Inability: Not on file  . Transportation  needs    Medical: Not on file    Non-medical: Not on file  Tobacco Use  . Smoking status: Former Games developer  . Smokeless tobacco: Never Used  Substance and Sexual Activity  . Alcohol use: Yes  . Drug use: Not on file  . Sexual activity: Yes    Birth control/protection: Inserts  Lifestyle  . Physical activity    Days per week: Not on file    Minutes per session: Not on file  . Stress: Not on file  Relationships  . Social Musician on  phone: Not on file    Gets together: Not on file    Attends religious service: Not on file    Active member of club or organization: Not on file    Attends meetings of clubs or organizations: Not on file    Relationship status: Not on file  . Intimate partner violence    Fear of current or ex partner: Not on file    Emotionally abused: Not on file    Physically abused: Not on file    Forced sexual activity: Not on file  Other Topics Concern  . Not on file  Social History Narrative   Patient is right-handed. She lives with her husband in a one level home with a basement. She drinks one cup of coffee a day. She goes to [employer] daily.    REVIEW OF SYSTEMS: Constitutional: No fevers, chills, or sweats, no generalized fatigue, change in appetite Eyes: No visual changes, double vision, eye pain Ear, nose and throat: No hearing loss, ear pain, nasal congestion, sore throat Cardiovascular: No chest pain, palpitations Respiratory:  No shortness of breath at rest or with exertion, wheezes GastrointestinaI: No nausea, vomiting, diarrhea, abdominal pain, fecal incontinence Genitourinary:  No dysuria, urinary retention or frequency Musculoskeletal:  No neck pain, back pain Integumentary: No rash, pruritus, skin lesions Neurological: as above Psychiatric: No depression, insomnia, anxiety Endocrine: No palpitations, fatigue, diaphoresis, mood swings, change in appetite, change in weight, increased thirst Hematologic/Lymphatic:  No purpura, petechiae. Allergic/Immunologic: no itchy/runny eyes, nasal congestion, recent allergic reactions, rashes  PHYSICAL EXAM: Height 5\' 5"  (1.651 m), weight 151 lb 12.8 oz (68.9 kg). General: No acute distress.  Patient appears well-groomed.    IMPRESSION: 1.  Migraine with aura, without status migrainosus, not intractable.  Worse over past 2 months due to untimely death of her brother. 2.  Recurrent transient spells.  Unclear if due to hypoglycemia but  may be migraine related.  Seizure not suspected.  PLAN: 1.  For preventative management, we will increase Aimovig to 140mg  monthly.  When she takes her first dose of the 140mg , she will taper off of topiramate (50mg  at bedtime for a week, then stop) 2.  For abortive therapy, rizatriptan 10 mg 3.  Limit use of pain relievers to no more than 2 days out of week to prevent risk of rebound or medication-overuse headache. 4.  Keep headache diary 5.  Exercise, hydration, caffeine cessation, sleep hygiene, monitor for and avoid triggers 6.  Consider:  magnesium citrate 400mg  daily, riboflavin 400mg  daily, and coenzyme Q10 100mg  three times daily 7. Follow up 4 months  15 minutes spent face to face with patient, 100% spent discussing management.   , DO  CC:  , FNP

## 2019-10-29 ENCOUNTER — Ambulatory Visit (INDEPENDENT_AMBULATORY_CARE_PROVIDER_SITE_OTHER): Payer: BC Managed Care – PPO | Admitting: Neurology

## 2019-10-29 ENCOUNTER — Encounter: Payer: Self-pay | Admitting: Neurology

## 2019-10-29 ENCOUNTER — Other Ambulatory Visit: Payer: Self-pay

## 2019-10-29 VITALS — BP 116/81 | HR 80 | Ht 65.0 in | Wt 151.8 lb

## 2019-10-29 DIAGNOSIS — G43109 Migraine with aura, not intractable, without status migrainosus: Secondary | ICD-10-CM | POA: Diagnosis not present

## 2019-10-29 MED ORDER — AIMOVIG 140 MG/ML ~~LOC~~ SOAJ: 140.0000 mg | SUBCUTANEOUS | 11 refills | Status: DC

## 2019-10-29 MED ORDER — AIMOVIG 140 MG/ML ~~LOC~~ SOAJ: 140.0000 mg | SUBCUTANEOUS | 0 refills | Status: DC

## 2019-10-29 NOTE — Patient Instructions (Signed)
1.  Increase Aimovig to 140mg  every 30 days 2.  Once you take your first injection of Aimovig 140mg , we will taper off of topiramate.  Take 50mg  at bedtime for a week and then stop 3.  Use rizatriptan as needed.  Limit use of pain relievers to no more than 2 days out of week to prevent risk of rebound or medication-overuse headache. 4.  Follow up in 4 months

## 2019-11-02 ENCOUNTER — Ambulatory Visit: Payer: BC Managed Care – PPO | Admitting: Psychology

## 2019-11-09 DIAGNOSIS — Z91030 Bee allergy status: Secondary | ICD-10-CM | POA: Diagnosis not present

## 2019-11-09 DIAGNOSIS — G4700 Insomnia, unspecified: Secondary | ICD-10-CM | POA: Diagnosis not present

## 2019-11-09 DIAGNOSIS — F419 Anxiety disorder, unspecified: Secondary | ICD-10-CM | POA: Diagnosis not present

## 2019-11-09 DIAGNOSIS — M542 Cervicalgia: Secondary | ICD-10-CM | POA: Diagnosis not present

## 2019-11-09 DIAGNOSIS — Z888 Allergy status to other drugs, medicaments and biological substances status: Secondary | ICD-10-CM | POA: Diagnosis not present

## 2019-11-09 DIAGNOSIS — K449 Diaphragmatic hernia without obstruction or gangrene: Secondary | ICD-10-CM | POA: Diagnosis not present

## 2019-11-09 DIAGNOSIS — K219 Gastro-esophageal reflux disease without esophagitis: Secondary | ICD-10-CM | POA: Diagnosis not present

## 2019-11-09 DIAGNOSIS — Z87891 Personal history of nicotine dependence: Secondary | ICD-10-CM | POA: Diagnosis not present

## 2019-11-09 DIAGNOSIS — F330 Major depressive disorder, recurrent, mild: Secondary | ICD-10-CM | POA: Diagnosis not present

## 2019-11-09 DIAGNOSIS — Z823 Family history of stroke: Secondary | ICD-10-CM | POA: Diagnosis not present

## 2019-11-09 DIAGNOSIS — M47814 Spondylosis without myelopathy or radiculopathy, thoracic region: Secondary | ICD-10-CM | POA: Diagnosis not present

## 2019-11-09 DIAGNOSIS — G894 Chronic pain syndrome: Secondary | ICD-10-CM | POA: Diagnosis not present

## 2019-11-09 DIAGNOSIS — M47812 Spondylosis without myelopathy or radiculopathy, cervical region: Secondary | ICD-10-CM | POA: Diagnosis not present

## 2019-11-09 DIAGNOSIS — G43101 Migraine with aura, not intractable, with status migrainosus: Secondary | ICD-10-CM | POA: Diagnosis not present

## 2019-11-09 DIAGNOSIS — M797 Fibromyalgia: Secondary | ICD-10-CM | POA: Diagnosis not present

## 2019-11-09 DIAGNOSIS — K589 Irritable bowel syndrome without diarrhea: Secondary | ICD-10-CM | POA: Diagnosis not present

## 2019-11-09 DIAGNOSIS — N3010 Interstitial cystitis (chronic) without hematuria: Secondary | ICD-10-CM | POA: Diagnosis not present

## 2019-11-23 DIAGNOSIS — M542 Cervicalgia: Secondary | ICD-10-CM | POA: Diagnosis not present

## 2019-11-23 DIAGNOSIS — M47812 Spondylosis without myelopathy or radiculopathy, cervical region: Secondary | ICD-10-CM | POA: Diagnosis not present

## 2019-11-23 DIAGNOSIS — G894 Chronic pain syndrome: Secondary | ICD-10-CM | POA: Diagnosis not present

## 2019-11-23 DIAGNOSIS — F329 Major depressive disorder, single episode, unspecified: Secondary | ICD-10-CM | POA: Diagnosis not present

## 2019-11-23 DIAGNOSIS — G4700 Insomnia, unspecified: Secondary | ICD-10-CM | POA: Diagnosis not present

## 2019-11-23 DIAGNOSIS — K449 Diaphragmatic hernia without obstruction or gangrene: Secondary | ICD-10-CM | POA: Diagnosis not present

## 2019-12-08 ENCOUNTER — Ambulatory Visit: Payer: Self-pay | Admitting: Neurology

## 2019-12-17 DIAGNOSIS — Z20828 Contact with and (suspected) exposure to other viral communicable diseases: Secondary | ICD-10-CM | POA: Diagnosis not present

## 2019-12-21 ENCOUNTER — Other Ambulatory Visit: Payer: Self-pay

## 2019-12-21 ENCOUNTER — Encounter: Payer: Self-pay | Admitting: Family

## 2019-12-21 ENCOUNTER — Other Ambulatory Visit: Payer: Self-pay | Admitting: Family

## 2019-12-21 MED ORDER — RIZATRIPTAN BENZOATE 10 MG PO TABS: ORAL_TABLET | ORAL | 1 refills | Status: DC

## 2019-12-21 MED ORDER — TRAZODONE HCL 150 MG PO TABS: ORAL_TABLET | ORAL | 0 refills | Status: DC

## 2019-12-22 ENCOUNTER — Encounter: Payer: Self-pay | Admitting: Neurology

## 2019-12-22 NOTE — Progress Notes (Signed)
Key: [facility]

## 2019-12-22 NOTE — Progress Notes (Signed)
Submitted PA via covermymeds.

## 2019-12-24 DIAGNOSIS — M792 Neuralgia and neuritis, unspecified: Secondary | ICD-10-CM | POA: Diagnosis not present

## 2019-12-24 DIAGNOSIS — M797 Fibromyalgia: Secondary | ICD-10-CM | POA: Diagnosis not present

## 2019-12-24 DIAGNOSIS — G43101 Migraine with aura, not intractable, with status migrainosus: Secondary | ICD-10-CM | POA: Diagnosis not present

## 2019-12-24 DIAGNOSIS — G894 Chronic pain syndrome: Secondary | ICD-10-CM | POA: Diagnosis not present

## 2019-12-25 NOTE — Progress Notes (Signed)
Approved reference# [id number] valid 12/22/2019-03/20/2020

## 2020-01-01 ENCOUNTER — Telehealth: Payer: Self-pay | Admitting: Neurology

## 2020-01-01 ENCOUNTER — Encounter: Payer: Self-pay | Admitting: Family

## 2020-01-01 ENCOUNTER — Other Ambulatory Visit: Payer: Self-pay

## 2020-01-01 MED ORDER — TOPIRAMATE 50 MG PO TABS: ORAL_TABLET | ORAL | 1 refills | Status: DC

## 2020-01-01 NOTE — Telephone Encounter (Signed)
Pharmacist Kathlene November) called regarding patient and her Topamax 50mg  medication. Please Call. Thank you

## 2020-01-01 NOTE — Telephone Encounter (Signed)
Called pharmacy and left voicemail with quantity of 120 which is what she had last time.

## 2020-01-05 DIAGNOSIS — M7918 Myalgia, other site: Secondary | ICD-10-CM | POA: Diagnosis not present

## 2020-01-07 ENCOUNTER — Encounter: Payer: Self-pay | Admitting: Family

## 2020-01-08 ENCOUNTER — Other Ambulatory Visit: Payer: Self-pay | Admitting: Family

## 2020-01-08 MED ORDER — BUPROPION HCL ER (XL) 300 MG PO TB24: ORAL_TABLET | ORAL | 1 refills | Status: DC

## 2020-01-10 ENCOUNTER — Encounter: Payer: Self-pay | Admitting: *Deleted

## 2020-01-10 ENCOUNTER — Other Ambulatory Visit: Payer: Self-pay | Admitting: Neurology

## 2020-01-10 NOTE — Progress Notes (Signed)
Cancelled Wrong prescription

## 2020-01-21 DIAGNOSIS — M797 Fibromyalgia: Secondary | ICD-10-CM | POA: Diagnosis not present

## 2020-01-21 DIAGNOSIS — M792 Neuralgia and neuritis, unspecified: Secondary | ICD-10-CM | POA: Diagnosis not present

## 2020-01-21 DIAGNOSIS — G43101 Migraine with aura, not intractable, with status migrainosus: Secondary | ICD-10-CM | POA: Diagnosis not present

## 2020-01-21 DIAGNOSIS — G894 Chronic pain syndrome: Secondary | ICD-10-CM | POA: Diagnosis not present

## 2020-02-12 DIAGNOSIS — M792 Neuralgia and neuritis, unspecified: Secondary | ICD-10-CM | POA: Diagnosis not present

## 2020-02-12 DIAGNOSIS — G43101 Migraine with aura, not intractable, with status migrainosus: Secondary | ICD-10-CM | POA: Diagnosis not present

## 2020-02-12 DIAGNOSIS — Z79899 Other long term (current) drug therapy: Secondary | ICD-10-CM | POA: Diagnosis not present

## 2020-02-12 DIAGNOSIS — Z5181 Encounter for therapeutic drug level monitoring: Secondary | ICD-10-CM | POA: Diagnosis not present

## 2020-02-12 DIAGNOSIS — M797 Fibromyalgia: Secondary | ICD-10-CM | POA: Diagnosis not present

## 2020-02-29 ENCOUNTER — Other Ambulatory Visit: Payer: Self-pay | Admitting: Neurology

## 2020-03-08 ENCOUNTER — Encounter: Payer: Self-pay | Admitting: Neurology

## 2020-03-08 ENCOUNTER — Telehealth: Payer: Self-pay | Admitting: Neurology

## 2020-03-08 NOTE — Telephone Encounter (Signed)
Received paperwork through fax from [facility]- filled out info and sent to fax #: [phone] per Yasmin. Awaiting response back on PA. Thanks!

## 2020-03-08 NOTE — Telephone Encounter (Signed)
Yasmin with [employer] called and left a message requesting clarification on the patient's prescription for Aimovig 140 MG/ML. She said, "Specifically, we need to know if the patient is taking another CGRP. If so, which one?"  Fax: [phone]

## 2020-03-08 NOTE — Progress Notes (Signed)
Azayla Lichtenberger (Key: [id number]) Aimovig 140MG /ML auto-injectors   Form Blue Form (CB) Created 35 minutes ago Sent to Plan 2 minutes ago Plan Response 2 minutes ago Submit Clinical Questions less than a minute ago Determination Wait for Determination Please wait for [employer] [place] [facility] to return a determination.

## 2020-03-09 NOTE — Progress Notes (Addendum)
Teresa Campbell (Key: [id number]) Aimovig 140MG /ML auto-injectors   Form Blue Form (CB) Created 1 day ago Sent to Plan 1 day ago Plan Response 1 day ago Submit Clinical Questions 1 day ago Determination Favorable 30 minutes ago Message from Plan Effective from 03/08/2020 through 03/07/2021.   Fax was sent to scan into her chart

## 2020-03-10 DIAGNOSIS — G43101 Migraine with aura, not intractable, with status migrainosus: Secondary | ICD-10-CM | POA: Diagnosis not present

## 2020-03-10 DIAGNOSIS — M47812 Spondylosis without myelopathy or radiculopathy, cervical region: Secondary | ICD-10-CM | POA: Diagnosis not present

## 2020-03-10 DIAGNOSIS — M792 Neuralgia and neuritis, unspecified: Secondary | ICD-10-CM | POA: Diagnosis not present

## 2020-03-10 DIAGNOSIS — G894 Chronic pain syndrome: Secondary | ICD-10-CM | POA: Diagnosis not present

## 2020-03-14 ENCOUNTER — Encounter: Payer: Self-pay | Admitting: Neurology

## 2020-03-14 NOTE — Progress Notes (Signed)
  Patient not seen.  She did not respond to  email link for visit.  Called the patient which went to voicemail and had to leave a message.   Cira Servant, DO

## 2020-03-15 ENCOUNTER — Telehealth (INDEPENDENT_AMBULATORY_CARE_PROVIDER_SITE_OTHER): Payer: BC Managed Care – PPO | Admitting: Neurology

## 2020-03-15 ENCOUNTER — Other Ambulatory Visit: Payer: Self-pay

## 2020-03-15 DIAGNOSIS — Z5329 Procedure and treatment not carried out because of patient's decision for other reasons: Secondary | ICD-10-CM

## 2020-03-16 ENCOUNTER — Other Ambulatory Visit: Payer: Self-pay | Admitting: Family

## 2020-03-16 ENCOUNTER — Encounter: Payer: Self-pay | Admitting: Family

## 2020-03-16 MED ORDER — CITALOPRAM HYDROBROMIDE 40 MG PO TABS: 40.0000 mg | ORAL_TABLET | Freq: Every day | ORAL | 3 refills | Status: DC

## 2020-03-20 ENCOUNTER — Other Ambulatory Visit: Payer: Self-pay | Admitting: Family

## 2020-04-04 NOTE — Progress Notes (Signed)
NEUROLOGY FOLLOW UP OFFICE NOTE  Teresa Campbell [id number]  HISTORY OF PRESENT ILLNESS: Teresa Campbell is a 49 year old right-handed woman with fibromyalgia and depression who follows up for migraines.   UPDATE: Increased Aimovig to 140mg .  She wanted to restart topiramate as it seemed to previously help her headaches and fibromyalgia.  10 mild to moderate headache days in past 30 days (usually in the right jaw to the temple, treats with ibuprofen 800mg ).  1 was a migraine from 2 days ago.  It was likely triggered by emotional stress because her dog is elderly and was told that she had about a week to live due to her health issues.  It started at 4:30 PM and lasted until she fell asleep at 2:30 AM.  She took ibuprofen 800mg  and 3 rizatriptan but ineffective.  She woke up at 6:30 AM and had the residual headache. Still with it.  Some nausea and photosensitivity.  Aggravated with quick head movements.   Current NSAIDS:  ibuprofen 800mg  (takes most days of week for headache and fibromyalgia/neck pain) Current analgesics: hydrocodone (fibromyalgia) Current triptans: Rizatriptan 10 mg Current ergotamine:  none Current anti-emetic:  none Current muscle relaxants:  none Current anti-anxiolytic:  none Current sleep aide: Trazodone Current Antihypertensive medications:  none Current Antidepressant medications: citalopram 40 mg daily, bupropion 150 mg Current Anticonvulsant medications: Topiramate 100 mg twice daily (on higher doses she had hair thinning, weight loss) Current anti-CGRP:  Aimovig 140mg  Current Vitamins/Herbal/Supplements:  none Current Antihistamines/Decongestants:  meclizine Other therapy:  cervical nerve ablations (helpful); ketamine infusions for fibromyalgia. Hormone/birth control: NuvaRing   Caffeine:  1 cup of coffee daily Diet:  Drinks water all day.  Eats healthy Exercise:  routine Depression:  yes; Anxiety:  yes Other pain:  fibromyalgia Sleep hygiene:  Okay with  trazodone and exercise   She had workup to evaluate spells. Awake and asleep EEG from 07/01/2019 was normal. MRI of brain with and without contrast from 07/10/2019 was normal   HISTORY:  Migraines: Onset:  Childhood.  Missed school as a child.  Worse after starting her menstrual cycle. Location:  Bi-frontal Quality:  Pounding Initial Intensity:  Severe.  She denies new headache, thunderclap headache or severe headache that wakes her from sleep. Aura:  Flashing lights/blue colors Prodrome:  none Postdrome:  fatigue Associated symptoms:  Photophobia, phonophobia, nausea, osmophobia, sometimes vomiting.  She denies associated unilateral numbness or weakness. Initial Duration:  2 days with Maxalt (1 hour if taken at onset) Initial Frequency:  2 migraines past 30 days (15-17 a month prior to topiramate) Initial Frequency of abortive medication: 2 days past month Triggers: increased depression Relieving factors:  Ice pack on head and heat to back of neck Activity:  aggravates  She has neck pain and mild to moderate right sided jaw pain radiating to the temple.  She was evaluated for TMJ   Past NSAIDS:  Ibuprofen, naproxen, Toradol shot Past analgesics:  Tylenol, Excedrin Past abortive triptans: Zomig, sumatriptan tablet Past abortive ergotamine:  none Past muscle relaxants:  none Past anti-emetic:  none Past antihypertensive medications:  none Past antidepressant medications:  Amitriptyline 25mg  Past anticonvulsant medications:  gabapentin Past anti-CGRP:  none Past vitamins/Herbal/Supplements:  none Past antihistamines/decongestants:  none Other past therapies:  Botox (effective.  However, she was having trouble getting the Botox due to cost), Gamacor vagus nerve stimulator (abortive treatment, effective)   Family history of headache:  Father, paternal grandfather, 2 aunts   Transient Spells: In June 2020, she was in  the grocery store when she started feeling dizzy.  She couldn't  hear what the lady at check out was saying.  Everything was cloudy and her legs felt they were going to buckle.  She felt hot and diaphoretic.  She sat in her car with the air conditioner blowing, drank a water and ate a honey bun.  She felt better but was fatigued for the rest of the day.  She reports similar events in the past but not as severe since childhood.  She did have another episode the next day as well, less severe.  She drank orange juice and peanut butter crackers and it resolved.  No associated headaches.  She has had other events as well.  When she was around 41, she was at work and she became confused.  She couldn't understand her coworkers and she was lethargic.  She was unable to communicate with them.  She went to the ED where testing was unremarkable.  She had a similar event about a year ago.  She is unsure if associated with headaches because she suffers from so many headaches.Marland Kitchen  PAST MEDICAL HISTORY: Past Medical History:  Diagnosis Date  . Colon polyps   . Depression   . Fibromyalgia   . GERD (gastroesophageal reflux disease)   . Hemorrhoids   . Insomnia   . Migraine     MEDICATIONS: Current Outpatient Medications on File Prior to Visit  Medication Sig Dispense Refill  . buPROPion (WELLBUTRIN XL) 300 MG 24 hr tablet TAKE 1 TABLET(300 MG) BY MOUTH DAILY 90 tablet 1  . citalopram (CELEXA) 40 MG tablet Take 1 tablet (40 mg total) by mouth daily. 90 tablet 3  . Erenumab-aooe (AIMOVIG) 140 MG/ML SOAJ Inject 140 mg into the skin every 30 (thirty) days. 1 pen 11  . Erenumab-aooe (AIMOVIG) 140 MG/ML SOAJ Inject 140 mg into the skin every 30 (thirty) days. 1.12 mg 0  . fluticasone (FLONASE) 50 MCG/ACT nasal spray Place 2 sprays into both nostrils daily. 16 g 6  . HYDROcodone-acetaminophen (NORCO/VICODIN) 5-325 MG tablet TK 1 T PO Q 8 H PRN P FOR UP TO 30 DAYS  0  . Meclizine HCl 25 MG CHEW Chew by mouth.    Marland Kitchen NUVARING 0.12-0.015 MG/24HR vaginal ring     . omeprazole (PRILOSEC)  40 MG capsule Take 1 capsule (40 mg total) by mouth daily. 90 capsule 3  . ondansetron (ZOFRAN-ODT) 4 MG disintegrating tablet DISSOLVE 1 TABLET(4 MG) ON THE TONGUE EVERY 8 HOURS AS NEEDED FOR NAUSEA OR VOMITING 20 tablet 3  . rizatriptan (MAXALT) 10 MG tablet TAKE 1 TABLET AS NEEDED FORMIGRAINE MAY REPEAT IN 2   HOURS IF NEEDED 54 tablet 1  . topiramate (TOPAMAX) 50 MG tablet TAKE 1 TABLET BY MOUTH AT BEDTIME X 7 DAYS, THEN 2 AT BEDTIME X 7 DAYS, THEN 1 IN THE AM AND 2 AT BEDTIME X 7DAYS, THEN 2 TWICE DAILY 120 tablet 0  . traZODone (DESYREL) 150 MG tablet TAKE 1 TABLET BY MOUTH EVERY NIGHT AS DIRECTED. MAY INCREASE TO 2 TABLETS EVERY NIGHT AS DIRECTED 90 tablet 0   No current facility-administered medications on file prior to visit.    ALLERGIES: Allergies  Allergen Reactions  . Cymbalta [Duloxetine Hcl]     Ineffective  . Lyrica [Pregabalin] Other (See Comments)    Ineffective  . Tramadol     Ineffective     FAMILY HISTORY: Family History  Problem Relation Age of Onset  . Arthritis Mother   .  COPD Mother   . Depression Mother   . Hyperlipidemia Mother   . Miscarriages / [place] Mother   . Arthritis Father   . Depression Father   . Heart disease Father   . Hyperlipidemia Father   . Alcohol abuse Sister   . Depression Sister   . Drug abuse Sister   . Hyperlipidemia Sister   . Hypertension Sister   . Alcohol abuse Brother   . Depression Brother   . Drug abuse Brother   . Depression Daughter   . Alcohol abuse Maternal Grandmother   . Depression Maternal Grandmother   . Stroke Maternal Grandfather   . Arthritis Paternal Grandmother   . Diabetes Paternal Grandmother   . Stroke Paternal Grandfather   . Alcohol abuse Sister   . Depression Sister   . Drug abuse Sister   . Depression Brother     SOCIAL HISTORY: Social History   Socioeconomic History  . Marital status: Married    Spouse name: Dellis Filbert  . Number of children: 1  . Years of education: Not on file    . Highest education level: Bachelor's degree (e.g., BA, AB, BS)  Occupational History  . Occupation: Building control surveyor: [employer]  Tobacco Use  . Smoking status: Former Research scientist (life sciences)  . Smokeless tobacco: Never Used  Substance and Sexual Activity  . Alcohol use: Yes  . Drug use: Not on file  . Sexual activity: Yes    Birth control/protection: Inserts  Other Topics Concern  . Not on file  Social History Narrative   Patient is right-handed. She lives with her husband in a one level home with a basement. She drinks one cup of coffee a day. She goes to [employer] daily.   Social Determinants of Health   Financial Resource Strain:   . Difficulty of Paying Living Expenses:   Food Insecurity:   . Worried About Charity fundraiser in the Last Year:   . Arboriculturist in the Last Year:   Transportation Needs:   . Film/video editor (Medical):   Marland Kitchen Lack of Transportation (Non-Medical):   Physical Activity:   . Days of Exercise per Week:   . Minutes of Exercise per Session:   Stress:   . Feeling of Stress :   Social Connections:   . Frequency of Communication with Friends and Family:   . Frequency of Social Gatherings with Friends and Family:   . Attends Religious Services:   . Active Member of Clubs or Organizations:   . Attends Archivist Meetings:   Marland Kitchen Marital Status:   Intimate Partner Violence:   . Fear of Current or Ex-Partner:   . Emotionally Abused:   Marland Kitchen Physically Abused:   . Sexually Abused:     PHYSICAL EXAM: Blood pressure 104/70, pulse 82, resp. rate 20, height 5' 5.5" (1.664 m), weight 155 lb (70.3 kg), SpO2 97 %. General: No acute distress.  Patient appears well-groomed.   Head:  Normocephalic/atraumatic Eyes:  Fundi examined but not visualized Neck: supple, no paraspinal tenderness, full range of motion Heart:  Regular rate and rhythm Lungs:  Clear to auscultation bilaterally Back: No paraspinal tenderness Neurological Exam:  alert and oriented to person, place, and time. Attention span and concentration intact, recent and remote memory intact, fund of knowledge intact.  Speech fluent and not dysarthric, language intact.  CN II-XII intact. Bulk and tone normal, muscle strength 5/5 throughout.  Sensation to light touch, temperature and  vibration intact.  Deep tendon reflexes 2+ throughout, toes downgoing.  Finger to nose and heel to shin testing intact.  Gait normal, Romberg negative.  IMPRESSION: Migraine with aura, without status migrainosus, not intractable  PLAN: 1.  For preventative management, Aimovig 140mg  monthly; topiramate 100mg  twice daily 2.  For abortive therapy, rizatriptan but will also provide Nurtec.  3.  For onset of neck/jaw pain, tizanidine 2mg  PRN. 4.  Limit use of pain relievers to no more than 2 days out of week to prevent risk of rebound or medication-overuse headache. 5.  Keep headache diary 6.  Exercise, hydration, caffeine cessation, sleep hygiene, monitor for and avoid triggers 7. Follow up 4 months.   , DO  CC: , FNP

## 2020-04-05 ENCOUNTER — Other Ambulatory Visit: Payer: Self-pay

## 2020-04-05 ENCOUNTER — Encounter: Payer: Self-pay | Admitting: Neurology

## 2020-04-05 ENCOUNTER — Ambulatory Visit (INDEPENDENT_AMBULATORY_CARE_PROVIDER_SITE_OTHER): Payer: BC Managed Care – PPO | Admitting: Neurology

## 2020-04-05 VITALS — BP 104/70 | HR 82 | Resp 20 | Ht 65.5 in | Wt 155.0 lb

## 2020-04-05 DIAGNOSIS — G43109 Migraine with aura, not intractable, without status migrainosus: Secondary | ICD-10-CM

## 2020-04-05 MED ORDER — TIZANIDINE HCL 2 MG PO TABS: 2.0000 mg | ORAL_TABLET | Freq: Four times a day (QID) | ORAL | 3 refills | Status: DC | PRN

## 2020-04-05 NOTE — Patient Instructions (Signed)
1.  Continue Aimovig and topiramate 2.  Will give you Nurtec dissolvable tablet.  Take 1 tablet for migraine (maximum 1 tablet in 24 hours).  May try as first line as well as second line (after rizatriptan) 3.  When you feel neck/jaw pain, take tizanidine 2mg  as needed.  Caution for drowsiness 4.  Follow up in 4 months.

## 2020-04-29 ENCOUNTER — Other Ambulatory Visit: Payer: Self-pay | Admitting: Neurology

## 2020-06-02 ENCOUNTER — Encounter: Payer: Self-pay | Admitting: Family

## 2020-06-06 ENCOUNTER — Other Ambulatory Visit: Payer: Self-pay

## 2020-06-06 MED ORDER — TOPIRAMATE 100 MG PO TABS: ORAL_TABLET | ORAL | 4 refills | Status: DC

## 2020-06-10 ENCOUNTER — Other Ambulatory Visit: Payer: Self-pay | Admitting: Neurology

## 2020-06-21 DIAGNOSIS — Z5181 Encounter for therapeutic drug level monitoring: Secondary | ICD-10-CM | POA: Diagnosis not present

## 2020-06-21 DIAGNOSIS — M792 Neuralgia and neuritis, unspecified: Secondary | ICD-10-CM | POA: Diagnosis not present

## 2020-06-21 DIAGNOSIS — G894 Chronic pain syndrome: Secondary | ICD-10-CM | POA: Diagnosis not present

## 2020-06-21 DIAGNOSIS — G43101 Migraine with aura, not intractable, with status migrainosus: Secondary | ICD-10-CM | POA: Diagnosis not present

## 2020-06-21 DIAGNOSIS — Z79899 Other long term (current) drug therapy: Secondary | ICD-10-CM | POA: Diagnosis not present

## 2020-06-21 DIAGNOSIS — M47812 Spondylosis without myelopathy or radiculopathy, cervical region: Secondary | ICD-10-CM | POA: Diagnosis not present

## 2020-06-28 ENCOUNTER — Telehealth (INDEPENDENT_AMBULATORY_CARE_PROVIDER_SITE_OTHER): Payer: BC Managed Care – PPO | Admitting: Family

## 2020-06-28 DIAGNOSIS — R110 Nausea: Secondary | ICD-10-CM

## 2020-06-28 DIAGNOSIS — G43709 Chronic migraine without aura, not intractable, without status migrainosus: Secondary | ICD-10-CM | POA: Diagnosis not present

## 2020-06-28 NOTE — Progress Notes (Signed)
Teresa Campbell is a 49 y.o. female with the following history as recorded in Epic[facility]:  Patient Active Problem List   Diagnosis Date Noted  . Chronic migraine without aura without status migrainosus, not intractable 11/03/2018  . Depression 11/03/2018  . Gastroesophageal reflux disease 11/03/2018  . Chronic insomnia 11/03/2018  . Mild episode of recurrent major depressive disorder (HCC) 06/05/2018  . Spondylosis without myelopathy or radiculopathy, thoracic region 02/03/2018  . Hiatal hernia with GERD without esophagitis 11/25/2017  . Spondylosis without myelopathy or radiculopathy, cervical region 07/03/2016  . Neck pain 12/27/2015  . Migraine with aura and with status migrainosus, not intractable 04/19/2015  . Persistent headaches 01/24/2014  . Chronic pain syndrome 01/24/2014  . ACUTE SINUSITIS, UNSPECIFIED 08/01/2011  . BRONCHITIS, ACUTE 08/01/2011    Current Outpatient Medications  Medication Sig Dispense Refill  . buPROPion (WELLBUTRIN XL) 300 MG 24 hr tablet TAKE 1 TABLET(300 MG) BY MOUTH DAILY 90 tablet 1  . citalopram (CELEXA) 40 MG tablet Take 1 tablet (40 mg total) by mouth daily. 90 tablet 3  . Erenumab-aooe (AIMOVIG) 140 MG/ML SOAJ Inject 140 mg into the skin every 30 (thirty) days. 1 pen 11  . Erenumab-aooe (AIMOVIG) 140 MG/ML SOAJ Inject 140 mg into the skin every 30 (thirty) days. 1.12 mg 0  . Erenumab-aooe (AIMOVIG) 140 MG/ML SOAJ ADMINISTER 1 ML UNDER THE SKIN EVERY 30 DAYS    . fluticasone (FLONASE) 50 MCG/ACT nasal spray Place 2 sprays into both nostrils daily. 16 g 6  . HYDROcodone-acetaminophen (NORCO/VICODIN) 5-325 MG tablet TK 1 T PO Q 8 H PRN P FOR UP TO 30 DAYS  0  . Meclizine HCl 25 MG CHEW Chew by mouth.    Marland Kitchen NUVARING 0.12-0.015 MG/24HR vaginal ring     . omeprazole (PRILOSEC) 40 MG capsule Take 1 capsule (40 mg total) by mouth daily. 90 capsule 3  . ondansetron (ZOFRAN-ODT) 4 MG disintegrating tablet DISSOLVE 1 TABLET(4 MG) ON THE TONGUE EVERY 8 HOURS AS  NEEDED FOR NAUSEA OR VOMITING 20 tablet 3  . rizatriptan (MAXALT) 10 MG tablet TAKE 1 TABLET BY MOUTH AS NEEDED FOR MIGRAINE. MAY REPEAT IN 2 HOURS IF NEEDED 54 tablet 0  . tiZANidine (ZANAFLEX) 2 MG tablet TAKE 1 TABLET(2 MG) BY MOUTH EVERY 6 HOURS AS NEEDED FOR MUSCLE SPASMS 30 tablet 3  . topiramate (TOPAMAX) 100 MG tablet Take one  capsule twice daily 60 tablet 4  . traZODone (DESYREL) 150 MG tablet TAKE 1 TABLET BY MOUTH EVERY NIGHT AS DIRECTED. MAY INCREASE TO 2 TABLETS EVERY NIGHT AS DIRECTED 90 tablet 0   No current facility-administered medications for this visit.    Allergies: Cymbalta [duloxetine hcl], Lyrica [pregabalin], and Tramadol  Past Medical History:  Diagnosis Date  . Colon polyps   . Depression   . Fibromyalgia   . GERD (gastroesophageal reflux disease)   . Hemorrhoids   . Insomnia   . Migraine     No past surgical history on file.  Family History  Problem Relation Age of Onset  . Arthritis Mother   . COPD Mother   . Depression Mother   . Hyperlipidemia Mother   . Miscarriages / [place] Mother   . Arthritis Father   . Depression Father   . Heart disease Father   . Hyperlipidemia Father   . Alcohol abuse Sister   . Depression Sister   . Drug abuse Sister   . Hyperlipidemia Sister   . Hypertension Sister   . Alcohol abuse Brother   .  Depression Brother   . Drug abuse Brother   . Depression Daughter   . Alcohol abuse Maternal Grandmother   . Depression Maternal Grandmother   . Stroke Maternal Grandfather   . Arthritis Paternal Grandmother   . Diabetes Paternal Grandmother   . Stroke Paternal Grandfather   . Alcohol abuse Sister   . Depression Sister   . Drug abuse Sister   . Depression Brother     Social History   Tobacco Use  . Smoking status: Former Games developer  . Smokeless tobacco: Never Used  Substance Use Topics  . Alcohol use: Yes    Subjective:   I connected with Tobe Sos on 06/28/20 at 10:00 AM EDT by a telephone call and  verified that I am speaking with the correct person using two identifiers.   I discussed the limitations of evaluation and management by telemedicine and the availability of in person appointments. The patient expressed understanding and agreed to proceed. Provider in office/ patient is at home; provider and patient are only 2 people on phone call.   Patient had a severe migraine on Sunday into Monday; since that time, has had persisting nausea/ body aches/ overall not feeling well; did remember the Zofran given by her neurologist and used last night; has actually felt better today; no diarrhea or acute abdominal pain; able to keep water down/ was eating a bagel during phone call;     Objective:  There were no vitals filed for this visit.  Lungs: Respirations unlabored;  Neurologic: Alert and oriented; speech intact;  Assessment:  1. Chronic migraine without aura without status migrainosus, not intractable   2. Nausea     Plan:  Suspect lingering symptoms of migraine; she will try using her Zofran every 6-8 hours and discussed BRAT diet/ use of Ginger Ale/ Sprite; if symptoms persist, will need in office visit;   Time spent 16 minutes  No follow-ups on file.  No orders of the defined types were placed in this encounter.   Requested Prescriptions    No prescriptions requested or ordered in this encounter

## 2020-06-29 ENCOUNTER — Encounter: Payer: Self-pay | Admitting: Family

## 2020-07-01 ENCOUNTER — Other Ambulatory Visit: Payer: Self-pay

## 2020-07-01 ENCOUNTER — Encounter: Payer: Self-pay | Admitting: Family

## 2020-07-01 ENCOUNTER — Other Ambulatory Visit (INDEPENDENT_AMBULATORY_CARE_PROVIDER_SITE_OTHER): Payer: BC Managed Care – PPO

## 2020-07-01 ENCOUNTER — Ambulatory Visit (INDEPENDENT_AMBULATORY_CARE_PROVIDER_SITE_OTHER)
Admission: RE | Admit: 2020-07-01 | Discharge: 2020-07-01 | Disposition: A | Discharge status: Home / Self Care | Payer: BC Managed Care – PPO | Source: Ambulatory Visit | Attending: Family | Admitting: Family

## 2020-07-01 ENCOUNTER — Ambulatory Visit (INDEPENDENT_AMBULATORY_CARE_PROVIDER_SITE_OTHER): Payer: BC Managed Care – PPO | Admitting: Family

## 2020-07-01 DIAGNOSIS — D1803 Hemangioma of intra-abdominal structures: Secondary | ICD-10-CM | POA: Diagnosis not present

## 2020-07-01 DIAGNOSIS — R1030 Lower abdominal pain, unspecified: Secondary | ICD-10-CM

## 2020-07-01 DIAGNOSIS — K7689 Other specified diseases of liver: Secondary | ICD-10-CM | POA: Diagnosis not present

## 2020-07-01 LAB — CBC WITH DIFFERENTIAL/PLATELET
Basophils Absolute: 0.1 10*3/uL (ref 0.0–0.1)
Basophils Relative: 1 % (ref 0.0–3.0)
Eosinophils Absolute: 0.1 10*3/uL (ref 0.0–0.7)
Eosinophils Relative: 1.3 % (ref 0.0–5.0)
HCT: 39.9 % (ref 36.0–46.0)
Hemoglobin: 13.4 g/dL (ref 12.0–15.0)
Lymphocytes Relative: 27.6 % (ref 12.0–46.0)
Lymphs Abs: 2.6 10*3/uL (ref 0.7–4.0)
MCHC: 33.6 g/dL (ref 30.0–36.0)
MCV: 94.6 fl (ref 78.0–100.0)
Monocytes Absolute: 0.7 10*3/uL (ref 0.1–1.0)
Monocytes Relative: 7.4 % (ref 3.0–12.0)
Neutro Abs: 5.8 10*3/uL (ref 1.4–7.7)
Neutrophils Relative %: 62.7 % (ref 43.0–77.0)
Platelets: 370 10*3/uL (ref 150.0–400.0)
RBC: 4.22 Mil/uL (ref 3.87–5.11)
RDW: 12.6 % (ref 11.5–15.5)
WBC: 9.3 10*3/uL (ref 4.0–10.5)

## 2020-07-01 LAB — COMPREHENSIVE METABOLIC PANEL
ALT: 10 U/L (ref 0–35)
AST: 11 U/L (ref 0–37)
Albumin: 4 g/dL (ref 3.5–5.2)
Alkaline Phosphatase: 35 U/L — ABNORMAL LOW (ref 39–117)
BUN: 17 mg/dL (ref 6–23)
CO2: 21 mEq/L (ref 19–32)
Calcium: 9.7 mg/dL (ref 8.4–10.5)
Chloride: 112 mEq/L (ref 96–112)
Creatinine, Ser: 1.26 mg/dL — ABNORMAL HIGH (ref 0.40–1.20)
GFR: 45.28 mL/min — ABNORMAL LOW (ref >60.00)
Glucose, Bld: 97 mg/dL (ref 70–99)
Potassium: 4.3 mEq/L (ref 3.5–5.1)
Sodium: 139 mEq/L (ref 135–145)
Total Bilirubin: 0.2 mg/dL (ref 0.2–1.2)
Total Protein: 7.1 g/dL (ref 6.0–8.3)

## 2020-07-01 MED ORDER — IOHEXOL 300 MG/ML  SOLN
100.0000 mL | Freq: Once | INTRAMUSCULAR | Status: AC | PRN
  Administered 2020-07-01: 100 mL via INTRAVENOUS

## 2020-07-01 NOTE — Progress Notes (Signed)
Teresa Campbell is a 49 y.o. female with the following history as recorded in Epic[facility]:  Patient Active Problem List   Diagnosis Date Noted  . Chronic migraine without aura without status migrainosus, not intractable 11/03/2018  . Depression 11/03/2018  . Gastroesophageal reflux disease 11/03/2018  . Chronic insomnia 11/03/2018  . Mild episode of recurrent major depressive disorder ([place]) 06/05/2018  . Spondylosis without myelopathy or radiculopathy, thoracic region 02/03/2018  . Hiatal hernia with GERD without esophagitis 11/25/2017  . Spondylosis without myelopathy or radiculopathy, cervical region 07/03/2016  . Neck pain 12/27/2015  . Migraine with aura and with status migrainosus, not intractable 04/19/2015  . Persistent headaches 01/24/2014  . Chronic pain syndrome 01/24/2014  . ACUTE SINUSITIS, UNSPECIFIED 08/01/2011  . BRONCHITIS, ACUTE 08/01/2011    Current Outpatient Medications  Medication Sig Dispense Refill  . buPROPion (WELLBUTRIN XL) 300 MG 24 hr tablet TAKE 1 TABLET(300 MG) BY MOUTH DAILY 90 tablet 1  . citalopram (CELEXA) 40 MG tablet Take 1 tablet (40 mg total) by mouth daily. 90 tablet 3  . Erenumab-aooe (AIMOVIG) 140 MG/ML SOAJ Inject 140 mg into the skin every 30 (thirty) days. 1 pen 11  . Erenumab-aooe (AIMOVIG) 140 MG/ML SOAJ Inject 140 mg into the skin every 30 (thirty) days. 1.12 mg 0  . fluticasone (FLONASE) 50 MCG/ACT nasal spray Place 2 sprays into both nostrils daily. 16 g 6  . HYDROcodone-acetaminophen (NORCO/VICODIN) 5-325 MG tablet TK 1 T PO Q 8 H PRN P FOR UP TO 30 DAYS  0  . Meclizine HCl 25 MG CHEW Chew by mouth.    Marland Kitchen NUVARING 0.12-0.015 MG/24HR vaginal ring     . omeprazole (PRILOSEC) 40 MG capsule Take 1 capsule (40 mg total) by mouth daily. 90 capsule 3  . ondansetron (ZOFRAN-ODT) 4 MG disintegrating tablet DISSOLVE 1 TABLET(4 MG) ON THE TONGUE EVERY 8 HOURS AS NEEDED FOR NAUSEA OR VOMITING 20 tablet 3  . rizatriptan (MAXALT) 10 MG tablet TAKE 1 TABLET  BY MOUTH AS NEEDED FOR MIGRAINE. MAY REPEAT IN 2 HOURS IF NEEDED 54 tablet 0  . tiZANidine (ZANAFLEX) 2 MG tablet TAKE 1 TABLET(2 MG) BY MOUTH EVERY 6 HOURS AS NEEDED FOR MUSCLE SPASMS 30 tablet 3  . topiramate (TOPAMAX) 100 MG tablet Take one  capsule twice daily 60 tablet 4  . traZODone (DESYREL) 150 MG tablet TAKE 1 TABLET BY MOUTH EVERY NIGHT AS DIRECTED. MAY INCREASE TO 2 TABLETS EVERY NIGHT AS DIRECTED 90 tablet 0   No current facility-administered medications for this visit.    Allergies: Cymbalta [duloxetine hcl], Lyrica [pregabalin], and Tramadol  Past Medical History:  Diagnosis Date  . Colon polyps   . Depression   . Fibromyalgia   . GERD (gastroesophageal reflux disease)   . Hemorrhoids   . Insomnia   . Migraine     History reviewed. No pertinent surgical history.  Family History  Problem Relation Age of Onset  . Arthritis Mother   . COPD Mother   . Depression Mother   . Hyperlipidemia Mother   . Miscarriages / [place] Mother   . Arthritis Father   . Depression Father   . Heart disease Father   . Hyperlipidemia Father   . Alcohol abuse Sister   . Depression Sister   . Drug abuse Sister   . Hyperlipidemia Sister   . Hypertension Sister   . Alcohol abuse Brother   . Depression Brother   . Drug abuse Brother   . Depression Daughter   .  Alcohol abuse Maternal Grandmother   . Depression Maternal Grandmother   . Stroke Maternal Grandfather   . Arthritis Paternal Grandmother   . Diabetes Paternal Grandmother   . Stroke Paternal Grandfather   . Alcohol abuse Sister   . Depression Sister   . Drug abuse Sister   . Depression Brother     Social History   Tobacco Use  . Smoking status: Former Smoker  . Smokeless tobacco: Never Used  Substance Use Topics  . Alcohol use: Yes    Subjective:  Complaining of abdominal pain ( lower pain) x 2-3 weeks/ notes that she has been more tired than normal lately/ "just feel really exhausted" ; does have endometriosis-  wears Nuva Ring continuously; also has IC- does not see urology regularly; has used medication in the past by typically manages with diet; notes that current pain "feels different" than her normal IC symptoms; + chronic nausea;  Has had colonoscopy x 2 ( 1st one done 17 years ago/ last one approximately 5 years ago)- 1st done due to pain/ diarrhea; did show diverticulosis- has been treated for diverticulitis at some point in the past;    Objective:  Vitals:   07/01/20 0853  BP: 116/70  Pulse: 78  Temp: 98.4 F (36.9 C)  TempSrc: Oral  SpO2: 97%  Weight: 153 lb (69.4 kg)  Height: 5' 5.5" (1.664 m)    General: Well developed, well nourished, in no acute distress  Skin : Warm and dry.  Head: Normocephalic and atraumatic  Lungs: Respirations unlabored; clear to auscultation bilaterally without wheeze, rales, rhonchi  Abdomen: Soft; tender over LLQ and RLQ; nondistended; normoactive bowel sounds; no masses or hepatosplenomegaly  Musculoskeletal: No deformities; no active joint inflammation  Extremities: No edema, cyanosis, clubbing  Vessels: Symmetric bilaterally  Neurologic: Alert and oriented; speech intact; face symmetrical; moves all extremities well; CNII-XII intact without focal deficit   Assessment:  1. Lower abdominal pain     Plan:  ? Etiology- concern for diverticulitis or complications from endometriosis; update CBC, CMP today; check abd/ pelvic CT; follow-up to be determined.  This visit occurred during the SARS-CoV-2 public health emergency.  Safety protocols were in place, including screening questions prior to the visit, additional usage of staff PPE, and extensive cleaning of exam room while observing appropriate contact time as indicated for disinfecting solutions.     No follow-ups on file.  Orders Placed This Encounter  Procedures  . CT Abdomen Pelvis W Contrast    Standing Status:   Future    Standing Expiration Date:   07/01/2021    Order Specific Question:    If indicated for the ordered procedure, I authorize the administration of contrast media per Radiology protocol    Answer:   Yes    Order Specific Question:   Is patient pregnant?    Answer:   No    Order Specific Question:   Preferred imaging location?    Answer:   [id number]    Order Specific Question:   Is Oral Contrast requested for this exam?    Answer:   Yes, Per Radiology protocol    Order Specific Question:   Radiology Contrast Protocol - do NOT remove file path    Answer:   [email or web address]  . CBC with Differential/Platelet    Standing Status:   Future    Standing Expiration Date:   07/01/2021  . Comp Met (CMET)    Standing Status:   Future    Standing   Expiration Date:   07/01/2021    Requested Prescriptions    No prescriptions requested or ordered in this encounter     

## 2020-07-04 DIAGNOSIS — N83202 Unspecified ovarian cyst, left side: Secondary | ICD-10-CM | POA: Diagnosis not present

## 2020-07-04 DIAGNOSIS — R1032 Left lower quadrant pain: Secondary | ICD-10-CM | POA: Diagnosis not present

## 2020-07-05 ENCOUNTER — Encounter: Payer: Self-pay | Admitting: Family

## 2020-07-07 ENCOUNTER — Encounter: Payer: Self-pay | Admitting: Family

## 2020-07-08 ENCOUNTER — Other Ambulatory Visit: Payer: Self-pay | Admitting: Family

## 2020-07-08 MED ORDER — TRAZODONE HCL 150 MG PO TABS: ORAL_TABLET | ORAL | 3 refills | Status: DC

## 2020-07-08 MED ORDER — BUPROPION HCL ER (XL) 300 MG PO TB24: ORAL_TABLET | ORAL | 3 refills | Status: DC

## 2020-07-15 DIAGNOSIS — G894 Chronic pain syndrome: Secondary | ICD-10-CM | POA: Diagnosis not present

## 2020-07-15 DIAGNOSIS — M792 Neuralgia and neuritis, unspecified: Secondary | ICD-10-CM | POA: Diagnosis not present

## 2020-07-19 DIAGNOSIS — Z124 Encounter for screening for malignant neoplasm of cervix: Secondary | ICD-10-CM | POA: Diagnosis not present

## 2020-07-19 DIAGNOSIS — Z309 Encounter for contraceptive management, unspecified: Secondary | ICD-10-CM | POA: Diagnosis not present

## 2020-07-19 DIAGNOSIS — Z01419 Encounter for gynecological examination (general) (routine) without abnormal findings: Secondary | ICD-10-CM | POA: Diagnosis not present

## 2020-07-19 DIAGNOSIS — N952 Postmenopausal atrophic vaginitis: Secondary | ICD-10-CM | POA: Diagnosis not present

## 2020-08-09 DIAGNOSIS — G894 Chronic pain syndrome: Secondary | ICD-10-CM | POA: Diagnosis not present

## 2020-08-09 DIAGNOSIS — G43101 Migraine with aura, not intractable, with status migrainosus: Secondary | ICD-10-CM | POA: Diagnosis not present

## 2020-08-09 DIAGNOSIS — M47812 Spondylosis without myelopathy or radiculopathy, cervical region: Secondary | ICD-10-CM | POA: Diagnosis not present

## 2020-08-09 DIAGNOSIS — M5416 Radiculopathy, lumbar region: Secondary | ICD-10-CM | POA: Diagnosis not present

## 2020-08-09 NOTE — Progress Notes (Signed)
NEUROLOGY FOLLOW UP OFFICE NOTE  Teresa Campbell [id number]  HISTORY OF PRESENT ILLNESS: Teresa Campbell a 49 year old right-handed woman with fibromyalgia and depression whofollows up for migraines.  UPDATE: Increased Aimovig.  She stayed on topiramate. Intensity:  Moderate to severe Duration:  Few hours Frequency:  9(4 days in August that were severe) Rescue:  For moderate:  Tizanidine, ibuprofen and ice; For severe:  Rizatriptan Current NSAIDS:ibuprofen 800mg  ( for headache and fibromyalgia/neck pain) Current analgesics:hydrocodone (fibromyalgia) Current triptans:Rizatriptan 10 mg Current ergotamine:none Current anti-emetic:none Current muscle relaxants:tizanidine 2mg  PRN (muscle spasms) Current anti-anxiolytic:none Current sleep aide:Trazodone Current Antihypertensive medications:none Current Antidepressant medications:citalopram 40 mg daily, bupropion 150 mg Current Anticonvulsant medications:Topiramate 100mg  twice daily (on higher doses she had hair thinning, weight loss) Current anti-CGRP:Aimovig 140mg  Current Vitamins/Herbal/Supplements:none Current Antihistamines/Decongestants:meclizine Other therapy:PT neck; ketamine infusions for fibromyalgia. Hormone/birth control: NuvaRing  Caffeine:1 cup of coffee daily Diet:Drinks water all day. Eats healthy Exercise: routine Depression:yes; Anxiety:yes Other pain:fibromyalgia, lumbar radiculopathy Sleep hygiene:Okay with trazodone and exercise   HISTORY: Migraines: Onset: Childhood. Missed school as a child. Worse after starting her menstrual cycle. Location:Bi-frontal Quality:Pounding InitialIntensity:Severe.Shedenies new headache, thunderclap headache or severe headache that wakes herfrom sleep. Aura:Flashing lights/blue colors Prodrome:none Postdrome:fatigue Associated symptoms: Photophobia, phonophobia, nausea, osmophobia, sometimes  vomiting.Shedenies associated unilateral numbness or weakness. InitialDuration:2 days with Maxalt (1 hour if taken at onset) InitialFrequency:2 migraines past 30 days (15-17 a month prior to topiramate) InitialFrequency of abortive medication:2 days past month Triggers: increased depression Relieving factors: Ice packon head and heat to back of neck Activity:aggravates  She has neck pain and mild to moderate right sided jaw pain radiating to the temple.  She was evaluated for TMJ  Past NSAIDS:Ibuprofen, naproxen, Toradol shot Past analgesics:Tylenol, Excedrin Past abortive triptans: Zomig, sumatriptan tablet Past abortive ergotamine:none Past muscle relaxants:none Past anti-emetic:none Past antihypertensive medications:none Past antidepressant medications:Amitriptyline 25mg  Past anticonvulsant medications:gabapentin Past anti-CGRP:none Past vitamins/Herbal/Supplements:none Past antihistamines/decongestants:none Other past therapies:Botox (effective. However, she was having trouble getting the Botox due to cost), Gamacor vagus nerve stimulator (abortive treatment, effective), cervical nerve ablations (helpful).  Family history of headache:Father, paternal grandfather, 2 aunts  Transient Spells: In June 2020, she was in the grocery store when she started feeling dizzy. She couldn't hear what the lady at check out was saying. Everything was cloudy and her legs felt they were going to buckle. She felt hot and diaphoretic. She sat in her car with the air conditioner blowing, drank a water and ate a honey bun. She felt better but was fatigued for the rest of the day. She reports similar events in the past but not as severe since childhood. She did have another episode the next day as well, less severe. She drank orange juice and peanut butter crackers and it resolved. No associated headaches.  She has had other events as well. When she was  around 41, she was at work and she became confused. She couldn't understand her coworkers and she was lethargic. She was unable to communicate with them. She went to the ED where testing was unremarkable. She had a similar event about a year ago. She is unsure if associated with headaches because she suffers from so many headaches.  Awake and asleep EEG from 07/01/2019 was normal.  MRI of brain with and without contrast from 07/10/2019 was normal  PAST MEDICAL HISTORY: Past Medical History:  Diagnosis Date  . Colon polyps   . Depression   . Fibromyalgia   . GERD (gastroesophageal reflux disease)   . Hemorrhoids   .  Insomnia   . Migraine     MEDICATIONS: Current Outpatient Medications on File Prior to Visit  Medication Sig Dispense Refill  . buPROPion (WELLBUTRIN XL) 300 MG 24 hr tablet TAKE 1 TABLET(300 MG) BY MOUTH DAILY 90 tablet 3  . citalopram (CELEXA) 40 MG tablet Take 1 tablet (40 mg total) by mouth daily. 90 tablet 3  . Erenumab-aooe (AIMOVIG) 140 MG/ML SOAJ Inject 140 mg into the skin every 30 (thirty) days. 1 pen 11  . Erenumab-aooe (AIMOVIG) 140 MG/ML SOAJ Inject 140 mg into the skin every 30 (thirty) days. 1.12 mg 0  . fluticasone (FLONASE) 50 MCG/ACT nasal spray Place 2 sprays into both nostrils daily. 16 g 6  . HYDROcodone-acetaminophen (NORCO/VICODIN) 5-325 MG tablet TK 1 T PO Q 8 H PRN P FOR UP TO 30 DAYS  0  . Meclizine HCl 25 MG CHEW Chew by mouth.    Marland Kitchen NUVARING 0.12-0.015 MG/24HR vaginal ring     . omeprazole (PRILOSEC) 40 MG capsule Take 1 capsule (40 mg total) by mouth daily. 90 capsule 3  . ondansetron (ZOFRAN-ODT) 4 MG disintegrating tablet DISSOLVE 1 TABLET(4 MG) ON THE TONGUE EVERY 8 HOURS AS NEEDED FOR NAUSEA OR VOMITING 20 tablet 3  . rizatriptan (MAXALT) 10 MG tablet TAKE 1 TABLET BY MOUTH AS NEEDED FOR MIGRAINE. MAY REPEAT IN 2 HOURS IF NEEDED 54 tablet 0  . tiZANidine (ZANAFLEX) 2 MG tablet TAKE 1 TABLET(2 MG) BY MOUTH EVERY 6 HOURS AS NEEDED FOR MUSCLE  SPASMS 30 tablet 3  . topiramate (TOPAMAX) 100 MG tablet Take one  capsule twice daily 60 tablet 4  . traZODone (DESYREL) 150 MG tablet TAKE 1 TABLET BY MOUTH EVERY NIGHT AS DIRECTED. MAY INCREASE TO 2 TABLETS EVERY NIGHT AS DIRECTED 90 tablet 3   No current facility-administered medications on file prior to visit.    ALLERGIES: Allergies  Allergen Reactions  . Cymbalta [Duloxetine Hcl]     Ineffective  . Lyrica [Pregabalin] Other (See Comments)    Ineffective  . Tramadol     Ineffective     FAMILY HISTORY: Family History  Problem Relation Age of Onset  . Arthritis Mother   . COPD Mother   . Depression Mother   . Hyperlipidemia Mother   . Miscarriages / [place] Mother   . Arthritis Father   . Depression Father   . Heart disease Father   . Hyperlipidemia Father   . Alcohol abuse Sister   . Depression Sister   . Drug abuse Sister   . Hyperlipidemia Sister   . Hypertension Sister   . Alcohol abuse Brother   . Depression Brother   . Drug abuse Brother   . Depression Daughter   . Alcohol abuse Maternal Grandmother   . Depression Maternal Grandmother   . Stroke Maternal Grandfather   . Arthritis Paternal Grandmother   . Diabetes Paternal Grandmother   . Stroke Paternal Grandfather   . Alcohol abuse Sister   . Depression Sister   . Drug abuse Sister   . Depression Brother     SOCIAL HISTORY: Social History   Socioeconomic History  . Marital status: Married    Spouse name: Tinnie Gens  . Number of children: 1  . Years of education: Not on file  . Highest education level: Bachelor's degree (e.g., BA, AB, BS)  Occupational History  . Occupation: Risk manager: [employer]  Tobacco Use  . Smoking status: Former Games developer  . Smokeless tobacco: Never Used  Vaping Use  . Vaping Use: Never used  Substance and Sexual Activity  . Alcohol use: Yes  . Drug use: Not on file  . Sexual activity: Yes    Birth control/protection: Inserts  Other  Topics Concern  . Not on file  Social History Narrative   Patient is right-handed. She lives with her husband in a one level home with a basement. She drinks one cup of coffee a day. She goes to [employer] daily.   Social Determinants of Health   Financial Resource Strain:   . Difficulty of Paying Living Expenses: Not on file  Food Insecurity:   . Worried About Programme researcher, broadcasting/film/video in the Last Year: Not on file  . Ran Out of Food in the Last Year: Not on file  Transportation Needs:   . Lack of Transportation (Medical): Not on file  . Lack of Transportation (Non-Medical): Not on file  Physical Activity:   . Days of Exercise per Week: Not on file  . Minutes of Exercise per Session: Not on file  Stress:   . Feeling of Stress : Not on file  Social Connections:   . Frequency of Communication with Friends and Family: Not on file  . Frequency of Social Gatherings with Friends and Family: Not on file  . Attends Religious Services: Not on file  . Active Member of Clubs or Organizations: Not on file  . Attends Banker Meetings: Not on file  . Marital Status: Not on file  Intimate Partner Violence:   . Fear of Current or Ex-Partner: Not on file  . Emotionally Abused: Not on file  . Physically Abused: Not on file  . Sexually Abused: Not on file    PHYSICAL EXAM: Blood pressure 105/67, pulse 83, height 5\' 5"  (1.651 m), weight 158 lb 9.6 oz (71.9 kg), SpO2 97 %. General: No acute distress.  Patient appears well-groomed.   Head:  Normocephalic/atraumatic Eyes:  Fundi examined but not visualized Neck: supple, bilateral paraspinal tenderness, full range of motion Back: Bilateral paraspinal tenderness Neurological Exam: alert and oriented to person, place, and time. Attention span and concentration intact, recent and remote memory intact, fund of knowledge intact.  Speech fluent and not dysarthric, language intact.  CN II-XII intact. Bulk and tone normal, muscle strength 5/5  throughout.  Sensation to light touch, temperature and vibration intact.  Deep tendon reflexes 2+ throughout, toes downgoing.  Finger to nose and heel to shin testing intact.  Gait normal, Romberg negative.  IMPRESSION: Migraine with aura, without status migrainosus, not intractable.  Improved with reduced analgesic intake. 2.  Cervicalgia/cervical spondylosis  PLAN: 1.  For preventative management, Aimovig 140mg  monthly and topiramate 100mg  BID 2.  For abortive therapy, rizatriptan for severe, ibuprofen and tizanidine for moderate/cervicogenic 3.  Limit use of pain relievers to no more than 2 days out of week to prevent risk of rebound or medication-overuse headache. 4.  Keep headache diary 5.  Exercise, hydration, caffeine cessation, sleep hygiene, monitor for and avoid triggers 6.  Follow up 6 months.   , DO  CC:  , FNP

## 2020-08-11 ENCOUNTER — Other Ambulatory Visit: Payer: Self-pay

## 2020-08-11 ENCOUNTER — Ambulatory Visit (INDEPENDENT_AMBULATORY_CARE_PROVIDER_SITE_OTHER): Payer: BC Managed Care – PPO | Admitting: Neurology

## 2020-08-11 ENCOUNTER — Encounter: Payer: Self-pay | Admitting: Neurology

## 2020-08-11 VITALS — BP 105/67 | HR 83 | Ht 65.0 in | Wt 158.6 lb

## 2020-08-11 DIAGNOSIS — G43109 Migraine with aura, not intractable, without status migrainosus: Secondary | ICD-10-CM

## 2020-08-11 DIAGNOSIS — M47812 Spondylosis without myelopathy or radiculopathy, cervical region: Secondary | ICD-10-CM

## 2020-08-11 DIAGNOSIS — G43709 Chronic migraine without aura, not intractable, without status migrainosus: Secondary | ICD-10-CM

## 2020-08-11 NOTE — Patient Instructions (Signed)
1.  Continue Aimovig and topiramate 2.  Rizatriptan for migraine rescue.  Use tizanidine and ibuprofen for other headaches 3.  Limit use of pain relievers to no more than 2 days out of week to prevent risk of rebound or medication-overuse headache. 4.  Keep headache diary 5.  Follow up 6 months.

## 2020-08-17 DIAGNOSIS — Z1231 Encounter for screening mammogram for malignant neoplasm of breast: Secondary | ICD-10-CM | POA: Diagnosis not present

## 2020-08-22 DIAGNOSIS — M792 Neuralgia and neuritis, unspecified: Secondary | ICD-10-CM | POA: Diagnosis not present

## 2020-08-22 DIAGNOSIS — M797 Fibromyalgia: Secondary | ICD-10-CM | POA: Diagnosis not present

## 2020-08-22 DIAGNOSIS — M542 Cervicalgia: Secondary | ICD-10-CM | POA: Diagnosis not present

## 2020-08-22 DIAGNOSIS — R519 Headache, unspecified: Secondary | ICD-10-CM | POA: Diagnosis not present

## 2020-08-24 DIAGNOSIS — M797 Fibromyalgia: Secondary | ICD-10-CM | POA: Diagnosis not present

## 2020-08-24 DIAGNOSIS — M542 Cervicalgia: Secondary | ICD-10-CM | POA: Diagnosis not present

## 2020-08-24 DIAGNOSIS — M792 Neuralgia and neuritis, unspecified: Secondary | ICD-10-CM | POA: Diagnosis not present

## 2020-08-24 DIAGNOSIS — R519 Headache, unspecified: Secondary | ICD-10-CM | POA: Diagnosis not present

## 2020-08-26 DIAGNOSIS — M48061 Spinal stenosis, lumbar region without neurogenic claudication: Secondary | ICD-10-CM | POA: Diagnosis not present

## 2020-08-26 DIAGNOSIS — M5117 Intervertebral disc disorders with radiculopathy, lumbosacral region: Secondary | ICD-10-CM | POA: Diagnosis not present

## 2020-08-26 DIAGNOSIS — M5116 Intervertebral disc disorders with radiculopathy, lumbar region: Secondary | ICD-10-CM | POA: Diagnosis not present

## 2020-08-26 DIAGNOSIS — M47816 Spondylosis without myelopathy or radiculopathy, lumbar region: Secondary | ICD-10-CM | POA: Diagnosis not present

## 2020-08-30 DIAGNOSIS — R519 Headache, unspecified: Secondary | ICD-10-CM | POA: Diagnosis not present

## 2020-08-30 DIAGNOSIS — M792 Neuralgia and neuritis, unspecified: Secondary | ICD-10-CM | POA: Diagnosis not present

## 2020-08-30 DIAGNOSIS — M797 Fibromyalgia: Secondary | ICD-10-CM | POA: Diagnosis not present

## 2020-08-30 DIAGNOSIS — M542 Cervicalgia: Secondary | ICD-10-CM | POA: Diagnosis not present

## 2020-09-02 DIAGNOSIS — M797 Fibromyalgia: Secondary | ICD-10-CM | POA: Diagnosis not present

## 2020-09-02 DIAGNOSIS — R519 Headache, unspecified: Secondary | ICD-10-CM | POA: Diagnosis not present

## 2020-09-02 DIAGNOSIS — M792 Neuralgia and neuritis, unspecified: Secondary | ICD-10-CM | POA: Diagnosis not present

## 2020-09-02 DIAGNOSIS — M542 Cervicalgia: Secondary | ICD-10-CM | POA: Diagnosis not present

## 2020-09-07 DIAGNOSIS — M5416 Radiculopathy, lumbar region: Secondary | ICD-10-CM | POA: Diagnosis not present

## 2020-09-07 DIAGNOSIS — G894 Chronic pain syndrome: Secondary | ICD-10-CM | POA: Diagnosis not present

## 2020-09-07 DIAGNOSIS — M792 Neuralgia and neuritis, unspecified: Secondary | ICD-10-CM | POA: Diagnosis not present

## 2020-09-15 DIAGNOSIS — M797 Fibromyalgia: Secondary | ICD-10-CM | POA: Diagnosis not present

## 2020-09-15 DIAGNOSIS — M792 Neuralgia and neuritis, unspecified: Secondary | ICD-10-CM | POA: Diagnosis not present

## 2020-09-15 DIAGNOSIS — M542 Cervicalgia: Secondary | ICD-10-CM | POA: Diagnosis not present

## 2020-09-15 DIAGNOSIS — R519 Headache, unspecified: Secondary | ICD-10-CM | POA: Diagnosis not present

## 2020-09-21 DIAGNOSIS — M797 Fibromyalgia: Secondary | ICD-10-CM | POA: Diagnosis not present

## 2020-09-21 DIAGNOSIS — M792 Neuralgia and neuritis, unspecified: Secondary | ICD-10-CM | POA: Diagnosis not present

## 2020-09-21 DIAGNOSIS — M542 Cervicalgia: Secondary | ICD-10-CM | POA: Diagnosis not present

## 2020-09-21 DIAGNOSIS — R519 Headache, unspecified: Secondary | ICD-10-CM | POA: Diagnosis not present

## 2020-10-07 ENCOUNTER — Other Ambulatory Visit: Payer: Self-pay | Admitting: Neurology

## 2020-10-11 DIAGNOSIS — M5416 Radiculopathy, lumbar region: Secondary | ICD-10-CM | POA: Diagnosis not present

## 2020-10-17 DIAGNOSIS — M797 Fibromyalgia: Secondary | ICD-10-CM | POA: Diagnosis not present

## 2020-10-17 DIAGNOSIS — Z91030 Bee allergy status: Secondary | ICD-10-CM | POA: Diagnosis not present

## 2020-10-17 DIAGNOSIS — Z886 Allergy status to analgesic agent status: Secondary | ICD-10-CM | POA: Diagnosis not present

## 2020-10-17 DIAGNOSIS — G894 Chronic pain syndrome: Secondary | ICD-10-CM | POA: Diagnosis not present

## 2020-10-17 DIAGNOSIS — Z888 Allergy status to other drugs, medicaments and biological substances status: Secondary | ICD-10-CM | POA: Diagnosis not present

## 2020-10-17 DIAGNOSIS — M47812 Spondylosis without myelopathy or radiculopathy, cervical region: Secondary | ICD-10-CM | POA: Diagnosis not present

## 2020-10-17 DIAGNOSIS — Z87891 Personal history of nicotine dependence: Secondary | ICD-10-CM | POA: Diagnosis not present

## 2020-10-21 DIAGNOSIS — Z20822 Contact with and (suspected) exposure to covid-19: Secondary | ICD-10-CM | POA: Diagnosis not present

## 2020-11-07 DIAGNOSIS — G894 Chronic pain syndrome: Secondary | ICD-10-CM | POA: Diagnosis not present

## 2020-11-07 DIAGNOSIS — Z91030 Bee allergy status: Secondary | ICD-10-CM | POA: Diagnosis not present

## 2020-11-07 DIAGNOSIS — M47812 Spondylosis without myelopathy or radiculopathy, cervical region: Secondary | ICD-10-CM | POA: Diagnosis not present

## 2020-11-07 DIAGNOSIS — Z888 Allergy status to other drugs, medicaments and biological substances status: Secondary | ICD-10-CM | POA: Diagnosis not present

## 2020-11-07 DIAGNOSIS — Z87891 Personal history of nicotine dependence: Secondary | ICD-10-CM | POA: Diagnosis not present

## 2020-11-10 ENCOUNTER — Other Ambulatory Visit: Payer: Self-pay

## 2020-11-10 MED ORDER — TOPIRAMATE 100 MG PO TABS: ORAL_TABLET | ORAL | 4 refills | Status: DC

## 2020-11-23 DIAGNOSIS — Z20822 Contact with and (suspected) exposure to covid-19: Secondary | ICD-10-CM | POA: Diagnosis not present

## 2020-11-23 DIAGNOSIS — R059 Cough, unspecified: Secondary | ICD-10-CM | POA: Diagnosis not present

## 2020-11-26 ENCOUNTER — Encounter: Payer: Self-pay | Admitting: Family

## 2020-11-28 NOTE — Telephone Encounter (Signed)
Patient calling wondering what she needs to do about her covid diagnosis. Wondering if she needs to get the infusion and also wondering if she needs any medication.

## 2020-11-29 ENCOUNTER — Telehealth: Payer: Self-pay | Admitting: Physician Assistant

## 2020-11-29 NOTE — Telephone Encounter (Signed)
Called to discuss with Teresa Campbell about Covid symptoms and the use of  monoclonal antibody infusion for those with mild to moderate Covid symptoms and at a high risk of hospitalization.    Pt does not qualify for infusion therapy as her symptoms first presented > 7 days prior to timing of infusion. Symptoms tier reviewed as well as criteria for ending isolation. Preventative practices reviewed. Patient verbalized understanding.  Patient Active Problem List   Diagnosis Date Noted  . Chronic migraine without aura without status migrainosus, not intractable 11/03/2018  . Depression 11/03/2018  . Gastroesophageal reflux disease 11/03/2018  . Chronic insomnia 11/03/2018  . Mild episode of recurrent major depressive disorder ([facility]) 06/05/2018  . Spondylosis without myelopathy or radiculopathy, thoracic region 02/03/2018  . Hiatal hernia with GERD without esophagitis 11/25/2017  . Spondylosis without myelopathy or radiculopathy, cervical region 07/03/2016  . Neck pain 12/27/2015  . Migraine with aura and with status migrainosus, not intractable 04/19/2015  . Persistent headaches 01/24/2014  . Chronic pain syndrome 01/24/2014  . ACUTE SINUSITIS, UNSPECIFIED 08/01/2011  . BRONCHITIS, ACUTE 08/01/2011    Manson Passey, PA-C

## 2020-12-22 ENCOUNTER — Other Ambulatory Visit: Payer: Self-pay | Admitting: Neurology

## 2021-02-01 DIAGNOSIS — M792 Neuralgia and neuritis, unspecified: Secondary | ICD-10-CM | POA: Diagnosis not present

## 2021-02-01 DIAGNOSIS — Z79899 Other long term (current) drug therapy: Secondary | ICD-10-CM | POA: Diagnosis not present

## 2021-02-01 DIAGNOSIS — M47812 Spondylosis without myelopathy or radiculopathy, cervical region: Secondary | ICD-10-CM | POA: Diagnosis not present

## 2021-02-01 DIAGNOSIS — M5416 Radiculopathy, lumbar region: Secondary | ICD-10-CM | POA: Diagnosis not present

## 2021-02-01 DIAGNOSIS — G894 Chronic pain syndrome: Secondary | ICD-10-CM | POA: Diagnosis not present

## 2021-02-01 DIAGNOSIS — Z5181 Encounter for therapeutic drug level monitoring: Secondary | ICD-10-CM | POA: Diagnosis not present

## 2021-02-06 ENCOUNTER — Other Ambulatory Visit: Payer: Self-pay | Admitting: Neurology

## 2021-02-09 NOTE — Progress Notes (Signed)
NEUROLOGY FOLLOW UP OFFICE NOTE  Teresa Campbell [id number]  Assessment/Plan:   1.  Migraine with aura, without status migrainosus, not intractable 2.  Cervical spondylosis  1.  Migraine prevention:  Aimovig 140mg  monthly; topiramate 100mg  BID 2.  Migraine rescue:  Rizatriptan 10mg  for severe migraines; ibuprofen with tizanidine for moderate/cervicogenic headaches 3.  Limit use of pain relievers to no more than 2 days out of week to prevent risk of rebound or medication-overuse headache. 4.  Keep headache diary 5.  Follow up 6 months  Subjective:  Teresa Campbell a 50 year old right-handed woman with fibromyalgia and depression whofollows up for migraines.  UPDATE: Had left C3-6 radiofrequency ablation in November, which has been helpful.  Next round is in June. Intensity:  Moderate to severe Duration:  Few hours Frequency:  2 Rescue:  For moderate:  Tizanidine, ibuprofen and ice; For severe:  Rizatriptan Current NSAIDS:ibuprofen 800mg  ( for headache and fibromyalgia/neck pain) Current analgesics:hydrocodone (fibromyalgia) Current triptans:Rizatriptan 10 mg Current ergotamine:none Current anti-emetic:none Current muscle relaxants:tizanidine 2mg  PRN (muscle spasms) Current anti-anxiolytic:none Current sleep aide:Trazodone Current Antihypertensive medications:none Current Antidepressant medications:citalopram 40 mg daily, bupropion 150 mg Current Anticonvulsant medications:Topiramate 100mg  twice daily (on higher doses she had hair thinning, weight loss) Current anti-CGRP:Aimovig 140mg  Current Vitamins/Herbal/Supplements:none Current Antihistamines/Decongestants:meclizine Other therapy:cervical radiofrequency ablations; PT neck; ketamine infusions for fibromyalgia. Hormone/birth control: NuvaRing  Caffeine:1 cup of coffee daily Diet:Drinks water all day. Eats healthy Exercise: routine Depression:yes; Anxiety:yes Other  pain:fibromyalgia, lumbar radiculopathy Sleep hygiene:Okay with trazodone and exercise   HISTORY: Migraines: Onset: Childhood. Missed school as a child. Worse after starting her menstrual cycle. Location:Bi-frontal Quality:Pounding InitialIntensity:Severe.Shedenies new headache, thunderclap headache or severe headache that wakes herfrom sleep. Aura:Flashing lights/blue colors Prodrome:none Postdrome:fatigue Associated symptoms: Photophobia, phonophobia, nausea, osmophobia, sometimes vomiting.Shedenies associated unilateral numbness or weakness. InitialDuration:2 days with Maxalt (1 hour if taken at onset) InitialFrequency:2 migraines past 30 days (15-17 a month prior to topiramate) InitialFrequency of abortive medication:2 days past month Triggers: increased depression Relieving factors: Ice packon head and heat to back of neck Activity:aggravates  She has neck pain and mild to moderate right sided jaw pain radiating to the temple. She was evaluated for TMJ  Past NSAIDS:Ibuprofen, naproxen, Toradol shot Past analgesics:Tylenol, Excedrin Past abortive triptans:Zomig, sumatriptan tablet Past abortive ergotamine:none Past muscle relaxants:none Past anti-emetic:none Past antihypertensive medications:none Past antidepressant medications:Amitriptyline 25mg  Past anticonvulsant medications:gabapentin Past anti-CGRP:none Past vitamins/Herbal/Supplements:none Past antihistamines/decongestants:none Other past therapies:Botox (effective. However, she was having trouble getting the Botox due to cost), Gamacor vagus nerve stimulator (abortive treatment, effective), cervical nerve ablations (helpful).  Family history of headache:Father, paternal grandfather, 2 aunts  Transient Spells: In June 2020, she was in the grocery store when she started feeling dizzy. She couldn't hear what the lady at check out was saying.  Everything was cloudy and her legs felt they were going to buckle. She felt hot and diaphoretic. She sat in her car with the air conditioner blowing, drank a water and ate a honey bun. She felt better but was fatigued for the rest of the day. She reports similar events in the past but not as severe since childhood. She did have another episode the next day as well, less severe. She drank orange juice and peanut butter crackers and it resolved. No associated headaches. She has had other events as well. When she was around 41, she was at work and she became confused. She couldn't understand her coworkers and she was lethargic. She was unable to communicate with them. She went to the  ED where testing was unremarkable. She had a similar event about a year ago. She is unsure if associated with headaches because she suffers from so many headaches.  Awake and asleep EEG from 07/01/2019 was normal.  MRI of brain with and without contrast from 07/10/2019 was normal  PAST MEDICAL HISTORY: Past Medical History:  Diagnosis Date  . Colon polyps   . Depression   . Fibromyalgia   . GERD (gastroesophageal reflux disease)   . Hemorrhoids   . Insomnia   . Migraine     MEDICATIONS: Current Outpatient Medications on File Prior to Visit  Medication Sig Dispense Refill  . AIMOVIG 140 MG/ML SOAJ ADMINISTER 1 ML UNDER THE SKIN EVERY 30 DAYS 1 mL 11  . buPROPion (WELLBUTRIN XL) 300 MG 24 hr tablet TAKE 1 TABLET(300 MG) BY MOUTH DAILY 90 tablet 3  . citalopram (CELEXA) 40 MG tablet Take 1 tablet (40 mg total) by mouth daily. 90 tablet 3  . fluticasone (FLONASE) 50 MCG/ACT nasal spray Place 2 sprays into both nostrils daily. 16 g 6  . HYDROcodone-acetaminophen (NORCO/VICODIN) 5-325 MG tablet TK 1 T PO Q 8 H PRN P FOR UP TO 30 DAYS  0  . Meclizine HCl 25 MG CHEW Chew by mouth.    Marland Kitchen NUVARING 0.12-0.015 MG/24HR vaginal ring     . omeprazole (PRILOSEC) 40 MG capsule Take 1 capsule (40 mg total) by mouth daily.  90 capsule 3  . ondansetron (ZOFRAN-ODT) 4 MG disintegrating tablet DISSOLVE 1 TABLET(4 MG) ON THE TONGUE EVERY 8 HOURS AS NEEDED FOR NAUSEA OR VOMITING 20 tablet 3  . rizatriptan (MAXALT) 10 MG tablet TAKE 1 TABLET BY MOUTH AS NEEDED FOR MIGRAINE. MAY REPEAT IN 2 HOURS IF NEEDED 54 tablet 0  . tiZANidine (ZANAFLEX) 2 MG tablet TAKE 1 TABLET(2 MG) BY MOUTH EVERY 6 HOURS AS NEEDED FOR MUSCLE SPASMS 30 tablet 3  . topiramate (TOPAMAX) 100 MG tablet Take one  capsule twice daily 60 tablet 4  . traZODone (DESYREL) 150 MG tablet TAKE 1 TABLET BY MOUTH EVERY NIGHT AS DIRECTED. MAY INCREASE TO 2 TABLETS EVERY NIGHT AS DIRECTED 90 tablet 3   No current facility-administered medications on file prior to visit.    ALLERGIES: Allergies  Allergen Reactions  . Cymbalta [Duloxetine Hcl]     Ineffective  . Lyrica [Pregabalin] Other (See Comments)    Ineffective  . Tramadol     Ineffective     FAMILY HISTORY: Family History  Problem Relation Age of Onset  . Arthritis Mother   . COPD Mother   . Depression Mother   . Hyperlipidemia Mother   . Miscarriages / [place] Mother   . Arthritis Father   . Depression Father   . Heart disease Father   . Hyperlipidemia Father   . Alcohol abuse Sister   . Depression Sister   . Drug abuse Sister   . Hyperlipidemia Sister   . Hypertension Sister   . Alcohol abuse Brother   . Depression Brother   . Drug abuse Brother   . Depression Daughter   . Alcohol abuse Maternal Grandmother   . Depression Maternal Grandmother   . Stroke Maternal Grandfather   . Arthritis Paternal Grandmother   . Diabetes Paternal Grandmother   . Stroke Paternal Grandfather   . Alcohol abuse Sister   . Depression Sister   . Drug abuse Sister   . Depression Brother      Objective:  Blood pressure 102/67, pulse 78, height 5'  5" (1.651 m), weight 161 lb 9.6 oz (73.3 kg), SpO2 97 %. General: No acute distress.  Patient appears well-groomed.     Shon Millet, DO  CC:   Olive Bass, FNP

## 2021-02-10 ENCOUNTER — Encounter: Payer: Self-pay | Admitting: Neurology

## 2021-02-10 ENCOUNTER — Ambulatory Visit (INDEPENDENT_AMBULATORY_CARE_PROVIDER_SITE_OTHER): Payer: BC Managed Care – PPO | Admitting: Neurology

## 2021-02-10 ENCOUNTER — Other Ambulatory Visit: Payer: Self-pay

## 2021-02-10 VITALS — BP 102/67 | HR 78 | Ht 65.0 in | Wt 161.6 lb

## 2021-02-10 DIAGNOSIS — M47812 Spondylosis without myelopathy or radiculopathy, cervical region: Secondary | ICD-10-CM | POA: Diagnosis not present

## 2021-02-10 DIAGNOSIS — G43109 Migraine with aura, not intractable, without status migrainosus: Secondary | ICD-10-CM

## 2021-02-10 MED ORDER — TIZANIDINE HCL 2 MG PO TABS: ORAL_TABLET | ORAL | 5 refills | Status: DC

## 2021-02-10 NOTE — Patient Instructions (Signed)
1.  Continue Aimovig 140mg .  If there is a problem getting it, let me know and we will switch you to another injection. 2.  Limit use of pain relievers to no more than 2 days out of week to prevent risk of rebound or medication-overuse headache. 3.  Keep headache diary 4.  Follow up 6 months.

## 2021-02-14 DIAGNOSIS — M5416 Radiculopathy, lumbar region: Secondary | ICD-10-CM | POA: Diagnosis not present

## 2021-02-27 ENCOUNTER — Encounter: Payer: Self-pay | Admitting: Neurology

## 2021-02-27 NOTE — Progress Notes (Signed)
Teresa Campbell (Key: [id number]) Aimovig 140MG /ML auto-injectors   Form Blue Form (CB) Created 4 days ago Sent to Plan 3 days ago Plan Response 3 days ago Submit Clinical Questions 3 days ago Determination Favorable 3 days ago Message from Plan Effective from 02/24/2021 through 02/23/2022.

## 2021-03-06 DIAGNOSIS — K582 Mixed irritable bowel syndrome: Secondary | ICD-10-CM | POA: Diagnosis not present

## 2021-03-06 DIAGNOSIS — K219 Gastro-esophageal reflux disease without esophagitis: Secondary | ICD-10-CM | POA: Diagnosis not present

## 2021-03-09 ENCOUNTER — Other Ambulatory Visit: Payer: Self-pay | Admitting: Neurology

## 2021-03-18 ENCOUNTER — Other Ambulatory Visit: Payer: Self-pay | Admitting: Family

## 2021-03-20 ENCOUNTER — Other Ambulatory Visit: Payer: Self-pay | Admitting: Neurology

## 2021-03-24 ENCOUNTER — Other Ambulatory Visit: Payer: Self-pay | Admitting: Family

## 2021-03-27 NOTE — Patient Instructions (Addendum)
Blood work was ordered.     Medications changes include :   none     Please followup in 6 months     Health Maintenance, Female Adopting a healthy lifestyle and getting preventive care are important in promoting health and wellness. Ask your health care provider about:  The right schedule for you to have regular tests and exams.  Things you can do on your own to prevent diseases and keep yourself healthy. What should I know about diet, weight, and exercise? Eat a healthy diet  Eat a diet that includes plenty of vegetables, fruits, low-fat dairy products, and lean protein.  Do not eat a lot of foods that are high in solid fats, added sugars, or sodium.   Maintain a healthy weight Body mass index (BMI) is used to identify weight problems. It estimates body fat based on height and weight. Your health care provider can help determine your BMI and help you achieve or maintain a healthy weight. Get regular exercise Get regular exercise. This is one of the most important things you can do for your health. Most adults should:  Exercise for at least 150 minutes each week. The exercise should increase your heart rate and make you sweat (moderate-intensity exercise).  Do strengthening exercises at least twice a week. This is in addition to the moderate-intensity exercise.  Spend less time sitting. Even light physical activity can be beneficial. Watch cholesterol and blood lipids Have your blood tested for lipids and cholesterol at 49 years of age, then have this test every 5 years. Have your cholesterol levels checked more often if:  Your lipid or cholesterol levels are high.  You are older than 49 years of age.  You are at high risk for heart disease. What should I know about cancer screening? Depending on your health history and family history, you may need to have cancer screening at various ages. This may include screening for:  Breast cancer.  Cervical  cancer.  Colorectal cancer.  Skin cancer.  Lung cancer. What should I know about heart disease, diabetes, and high blood pressure? Blood pressure and heart disease  High blood pressure causes heart disease and increases the risk of stroke. This is more likely to develop in people who have high blood pressure readings, are of African descent, or are overweight.  Have your blood pressure checked: ? Every 3-5 years if you are 18-39 years of age. ? Every year if you are 40 years old or older. Diabetes Have regular diabetes screenings. This checks your fasting blood sugar level. Have the screening done:  Once every three years after age 40 if you are at a normal weight and have a low risk for diabetes.  More often and at a younger age if you are overweight or have a high risk for diabetes. What should I know about preventing infection? Hepatitis B If you have a higher risk for hepatitis B, you should be screened for this virus. Talk with your health care provider to find out if you are at risk for hepatitis B infection. Hepatitis C Testing is recommended for:  Everyone born from 1945 through 1965.  Anyone with known risk factors for hepatitis C. Sexually transmitted infections (STIs)  Get screened for STIs, including gonorrhea and chlamydia, if: ? You are sexually active and are younger than 49 years of age. ? You are older than 49 years of age and your health care provider tells you that you are at risk for this type   of infection. ? Your sexual activity has changed since you were last screened, and you are at increased risk for chlamydia or gonorrhea. Ask your health care provider if you are at risk.  Ask your health care provider about whether you are at high risk for HIV. Your health care provider may recommend a prescription medicine to help prevent HIV infection. If you choose to take medicine to prevent HIV, you should first get tested for HIV. You should then be tested every 3  months for as long as you are taking the medicine. Pregnancy  If you are about to stop having your period (premenopausal) and you may become pregnant, seek counseling before you get pregnant.  Take 400 to 800 micrograms (mcg) of folic acid every day if you become pregnant.  Ask for birth control (contraception) if you want to prevent pregnancy. Osteoporosis and menopause Osteoporosis is a disease in which the bones lose minerals and strength with aging. This can result in bone fractures. If you are 65 years old or older, or if you are at risk for osteoporosis and fractures, ask your health care provider if you should:  Be screened for bone loss.  Take a calcium or vitamin D supplement to lower your risk of fractures.  Be given hormone replacement therapy (HRT) to treat symptoms of menopause. Follow these instructions at home: Lifestyle  Do not use any products that contain nicotine or tobacco, such as cigarettes, e-cigarettes, and chewing tobacco. If you need help quitting, ask your health care provider.  Do not use street drugs.  Do not share needles.  Ask your health care provider for help if you need support or information about quitting drugs. Alcohol use  Do not drink alcohol if: ? Your health care provider tells you not to drink. ? You are pregnant, may be pregnant, or are planning to become pregnant.  If you drink alcohol: ? Limit how much you use to 0-1 drink a day. ? Limit intake if you are breastfeeding.  Be aware of how much alcohol is in your drink. In the [place] one drink equals one 12 oz bottle of beer (355 mL), one 5 oz glass of wine (148 mL), or one 1 oz glass of hard liquor (44 mL). General instructions  Schedule regular health, dental, and eye exams.  Stay current with your vaccines.  Tell your health care provider if: ? You often feel depressed. ? You have ever been abused or do not feel safe at home. Summary  Adopting a healthy lifestyle and getting  preventive care are important in promoting health and wellness.  Follow your health care provider's instructions about healthy diet, exercising, and getting tested or screened for diseases.  Follow your health care provider's instructions on monitoring your cholesterol and blood pressure. This information is not intended to replace advice given to you by your health care provider. Make sure you discuss any questions you have with your health care provider. Document Revised: 11/19/2018 Document Reviewed: 11/19/2018 Elsevier Patient Education  2021 Elsevier Inc.  

## 2021-03-27 NOTE — Progress Notes (Signed)
Subjective:    Patient ID: Teresa Campbell, female    DOB: 05-17-1971, 50 y.o.   MRN: [id number]   This visit occurred during the SARS-CoV-2 public health emergency.  Safety protocols were in place, including screening questions prior to the visit, additional usage of staff PPE, and extensive cleaning of exam room while observing appropriate contact time as indicated for disinfecting solutions.    HPI  She is here to establish with a new pcp.  She is here for a physical exam.    left big toenail slightly red-and indented by cuticle.  There is slightly dry appearing.  The area affected increase and then all toenails turning brown on the distal part of the nails only.  Her left big toenail started hurting, which concerned her.    She has several chronic problems, including migraines, fibromyalgia, history of endometriosis, IBS, chronic neck pain.      Medications and allergies reviewed with patient and updated if appropriate.  Patient Active Problem List   Diagnosis Date Noted  . Chronic migraine without aura without status migrainosus, not intractable 11/03/2018  . Depression 11/03/2018  . Gastroesophageal reflux disease 11/03/2018  . Chronic insomnia 11/03/2018  . Mild episode of recurrent major depressive disorder (HCC) 06/05/2018  . Spondylosis without myelopathy or radiculopathy, thoracic region 02/03/2018  . Hiatal hernia with GERD without esophagitis 11/25/2017  . Spondylosis without myelopathy or radiculopathy, cervical region 07/03/2016  . Neck pain 12/27/2015  . Persistent headaches 01/24/2014  . Chronic pain syndrome 01/24/2014    Current Outpatient Medications on File Prior to Visit  Medication Sig Dispense Refill  . AIMOVIG 140 MG/ML SOAJ ADMINISTER 1 ML UNDER THE SKIN EVERY 30 DAYS 1 mL 11  . buPROPion (WELLBUTRIN XL) 300 MG 24 hr tablet TAKE 1 TABLET(300 MG) BY MOUTH DAILY 90 tablet 3  . citalopram (CELEXA) 40 MG tablet TAKE 1 TABLET(40 MG) BY MOUTH DAILY 90  tablet 1  . HYDROcodone-acetaminophen (NORCO/VICODIN) 5-325 MG tablet TK 1 T PO Q 8 H PRN P FOR UP TO 30 DAYS  0  . NUVARING 0.12-0.015 MG/24HR vaginal ring     . omeprazole (PRILOSEC) 40 MG capsule Take 1 capsule (40 mg total) by mouth daily. 90 capsule 3  . ondansetron (ZOFRAN-ODT) 4 MG disintegrating tablet DISSOLVE 1 TABLET(4 MG) ON THE TONGUE EVERY 8 HOURS AS NEEDED FOR NAUSEA OR VOMITING 20 tablet 3  . rizatriptan (MAXALT) 10 MG tablet TAKE 1 TABLET BY MOUTH AS NEEDED FOR MIGRAINE. MAY REPEAT IN 2 HOURS IF NEEDED 54 tablet 0  . tiZANidine (ZANAFLEX) 2 MG tablet TAKE 1 TABLET(2 MG) BY MOUTH EVERY 6 HOURS AS NEEDED FOR MUSCLE SPASMS 30 tablet 5  . topiramate (TOPAMAX) 100 MG tablet TAKE 1 TABLET BY MOUTH TWICE DAILY 180 tablet 0  . traZODone (DESYREL) 150 MG tablet TAKE 1 TABLET BY MOUTH EVERY NIGHT AS DIRECTED. MAY INCREASE TO 2 TABLETS EVERY NIGHT AS DIRECTED 90 tablet 1   No current facility-administered medications on file prior to visit.    Past Medical History:  Diagnosis Date  . Colon polyps   . Depression   . Fibromyalgia   . GERD (gastroesophageal reflux disease)   . Hemorrhoids   . Insomnia   . Migraine     History reviewed. No pertinent surgical history.  Social History   Socioeconomic History  . Marital status: Married    Spouse name: Tinnie Gens  . Number of children: 1  . Years of education: Not on file  .  Highest education level: Bachelor's degree (e.g., BA, AB, BS)  Occupational History  . Occupation: Risk manager: [employer]  Tobacco Use  . Smoking status: Former Games developer  . Smokeless tobacco: Never Used  Vaping Use  . Vaping Use: Never used  Substance and Sexual Activity  . Alcohol use: Yes  . Drug use: Not on file  . Sexual activity: Yes    Birth control/protection: Inserts  Other Topics Concern  . Not on file  Social History Narrative   Patient is right-handed. She lives with her husband in a one level home with a basement.  She drinks one cup of coffee a day. She goes to [employer] daily.   Social Determinants of Health   Financial Resource Strain: Not on file  Food Insecurity: Not on file  Transportation Needs: Not on file  Physical Activity: Not on file  Stress: Not on file  Social Connections: Not on file    Family History  Problem Relation Age of Onset  . Arthritis Mother   . COPD Mother   . Depression Mother   . Hyperlipidemia Mother   . Miscarriages / [place] Mother   . Arthritis Father   . Depression Father   . Heart disease Father   . Hyperlipidemia Father   . Alcohol abuse Sister   . Depression Sister   . Drug abuse Sister   . Hyperlipidemia Sister   . Hypertension Sister   . Alcohol abuse Brother   . Depression Brother   . Drug abuse Brother   . Depression Daughter   . Alcohol abuse Maternal Grandmother   . Depression Maternal Grandmother   . Stroke Maternal Grandfather   . Arthritis Paternal Grandmother   . Diabetes Paternal Grandmother   . Stroke Paternal Grandfather   . Alcohol abuse Sister   . Depression Sister   . Drug abuse Sister   . Depression Brother     Review of Systems  Constitutional: Negative for chills and fever.  Eyes: Negative for visual disturbance.  Respiratory: Negative for cough, shortness of breath and wheezing.   Cardiovascular: Negative for chest pain, palpitations and leg swelling.  Gastrointestinal: Positive for diarrhea. Negative for abdominal pain, blood in stool, constipation and nausea.       Gerd controlled  Genitourinary: Negative for dysuria.  Musculoskeletal: Positive for arthralgias (mild - fibro), back pain (controlled with epidurals), myalgias (mild - fibro) and neck pain (fairly controlled with treatments).  Skin:       Toenail discoloration  Neurological: Positive for headaches (migraines - 2 in past month, frequent headaches).  Psychiatric/Behavioral: Positive for dysphoric mood (controlled).       Objective:    Vitals:   03/28/21 1524  BP: 112/72  Pulse: 77  Temp: 98.5 F (36.9 C)  SpO2: 96%   Filed Weights   03/28/21 1524  Weight: 156 lb 12.8 oz (71.1 kg)   Body mass index is 26.09 kg/m.  BP Readings from Last 3 Encounters:  03/28/21 112/72  02/10/21 102/67  08/11/20 105/67    Wt Readings from Last 3 Encounters:  03/28/21 156 lb 12.8 oz (71.1 kg)  02/10/21 161 lb 9.6 oz (73.3 kg)  08/11/20 158 lb 9.6 oz (71.9 kg)     Physical Exam Constitutional: She appears well-developed and well-nourished. No distress.  HENT:  Head: Normocephalic and atraumatic.  Right Ear: External ear normal. Normal ear canal and TM Left Ear: External ear normal.  Normal ear canal and TM Mouth/Throat:  Oropharynx is clear and moist.  Eyes: Conjunctivae and EOM are normal.  Neck: Neck supple. No tracheal deviation present. No thyromegaly present.  No carotid bruit  Cardiovascular: Normal rate, regular rhythm and normal heart sounds.   No murmur heard.  No edema. Pulmonary/Chest: Effort normal and breath sounds normal. No respiratory distress. She has no wheezes. She has no rales.  Breast: deferred   Abdominal: Soft. She exhibits no distension. There is no tenderness.  Lymphadenopathy: She has no cervical adenopathy.  Skin: Skin is warm and dry. She is not diaphoretic.  Psychiatric: She has a normal mood and affect. Her behavior is normal.        Assessment & Plan:   Physical exam: Screening blood work    ordered Immunizations  Has not had covid vaccine - no questions about it Colonoscopy  Up to date  Mammogram  Up to date  Gyn   Up to date   Exercise  none Weight    Ok for age Substance abuse  none      See Problem List for Assessment and Plan of chronic medical problems.

## 2021-03-28 ENCOUNTER — Ambulatory Visit (INDEPENDENT_AMBULATORY_CARE_PROVIDER_SITE_OTHER): Payer: BC Managed Care – PPO | Admitting: Internal Medicine

## 2021-03-28 ENCOUNTER — Other Ambulatory Visit: Payer: Self-pay

## 2021-03-28 ENCOUNTER — Encounter: Payer: Self-pay | Admitting: Internal Medicine

## 2021-03-28 VITALS — BP 112/72 | HR 77 | Temp 98.5°F | Ht 65.0 in | Wt 156.8 lb

## 2021-03-28 DIAGNOSIS — Z0000 Encounter for general adult medical examination without abnormal findings: Secondary | ICD-10-CM

## 2021-03-28 DIAGNOSIS — K589 Irritable bowel syndrome without diarrhea: Secondary | ICD-10-CM

## 2021-03-28 DIAGNOSIS — M47812 Spondylosis without myelopathy or radiculopathy, cervical region: Secondary | ICD-10-CM

## 2021-03-28 DIAGNOSIS — G43709 Chronic migraine without aura, not intractable, without status migrainosus: Secondary | ICD-10-CM

## 2021-03-28 DIAGNOSIS — F3289 Other specified depressive episodes: Secondary | ICD-10-CM | POA: Diagnosis not present

## 2021-03-28 DIAGNOSIS — N1831 Chronic kidney disease, stage 3a: Secondary | ICD-10-CM | POA: Insufficient documentation

## 2021-03-28 DIAGNOSIS — F5104 Psychophysiologic insomnia: Secondary | ICD-10-CM

## 2021-03-28 DIAGNOSIS — Z8742 Personal history of other diseases of the female genital tract: Secondary | ICD-10-CM

## 2021-03-28 DIAGNOSIS — K219 Gastro-esophageal reflux disease without esophagitis: Secondary | ICD-10-CM

## 2021-03-28 DIAGNOSIS — K449 Diaphragmatic hernia without obstruction or gangrene: Secondary | ICD-10-CM

## 2021-03-28 DIAGNOSIS — M797 Fibromyalgia: Secondary | ICD-10-CM

## 2021-03-28 DIAGNOSIS — N183 Chronic kidney disease, stage 3 unspecified: Secondary | ICD-10-CM | POA: Insufficient documentation

## 2021-03-28 DIAGNOSIS — M549 Dorsalgia, unspecified: Secondary | ICD-10-CM

## 2021-03-28 DIAGNOSIS — N3010 Interstitial cystitis (chronic) without hematuria: Secondary | ICD-10-CM | POA: Insufficient documentation

## 2021-03-28 DIAGNOSIS — B351 Tinea unguium: Secondary | ICD-10-CM

## 2021-03-28 DIAGNOSIS — G8929 Other chronic pain: Secondary | ICD-10-CM | POA: Insufficient documentation

## 2021-03-28 MED ORDER — VITAMIN D3 25 MCG (1000 UT) PO CAPS: 1000.0000 [IU] | ORAL_CAPSULE | Freq: Every day | ORAL | Status: AC

## 2021-03-28 MED ORDER — CICLOPIROX 8 % EX SOLN: Freq: Every day | CUTANEOUS | 3 refills | Status: DC

## 2021-03-28 NOTE — Assessment & Plan Note (Signed)
Chronic History of mild fracture after fall Gets epidurals by pain management

## 2021-03-28 NOTE — Assessment & Plan Note (Addendum)
Chronic Following with GI GERD was not controlled and they recommended changing her diet Just started paleo diet Controlled now Continue omeprazole 40 mg daily-discussed some possible long-term side effects.  At this point I do not think she can come off the medication

## 2021-03-28 NOTE — Assessment & Plan Note (Signed)
Chronic Follows with pain management in [place] Takes hydrocodone and ketamine treatments Overall controlled Not currently exercising, but will start

## 2021-03-28 NOTE — Assessment & Plan Note (Signed)
Chronic Decreased GFR has been over the past few years-likely related to excessive NSAID use for migraines Advised stopping all NSAIDs Continue increase fluids CMP today

## 2021-03-28 NOTE — Assessment & Plan Note (Signed)
Chronic Controlled, stable Continue trazodone 75 mg nightly

## 2021-03-28 NOTE — Assessment & Plan Note (Signed)
Chronic Following with pain management in [place] Has a nerve ablation every 6 months Takes tizanidine

## 2021-03-28 NOTE — Assessment & Plan Note (Signed)
New Left big toe with discoloration and looks of possible onychomycosis.  Other toenails with slight discoloration and distal nails Trial of ciclopirox

## 2021-03-28 NOTE — Assessment & Plan Note (Signed)
Chronic Controlled, stable Continue citalopram 40 mg daily, bupropion 300 mg daily

## 2021-03-28 NOTE — Assessment & Plan Note (Addendum)
Chronic Fairly controlled On aimovig, topamax, , maxalt prn Following with Dr. Everlena Cooper  Taking ibuprofen as needed-she estimates about 8 a week-stressed avoiding this at all cost and reviewed decreased kidney function

## 2021-03-28 NOTE — Assessment & Plan Note (Signed)
Chronic Following with GI Taking omeprazole 40 mg daily

## 2021-03-29 LAB — COMPREHENSIVE METABOLIC PANEL
ALT: 17 U/L (ref 0–35)
AST: 18 U/L (ref 0–37)
Albumin: 3.8 g/dL (ref 3.5–5.2)
Alkaline Phosphatase: 33 U/L — ABNORMAL LOW (ref 39–117)
BUN: 11 mg/dL (ref 6–23)
CO2: 20 mEq/L (ref 19–32)
Calcium: 9.4 mg/dL (ref 8.4–10.5)
Chloride: 108 mEq/L (ref 96–112)
Creatinine, Ser: 1.24 mg/dL — ABNORMAL HIGH (ref 0.40–1.20)
GFR: 51.29 mL/min — ABNORMAL LOW (ref >60.00)
Glucose, Bld: 78 mg/dL (ref 70–99)
Potassium: 3.8 mEq/L (ref 3.5–5.1)
Sodium: 138 mEq/L (ref 135–145)
Total Bilirubin: 0.2 mg/dL (ref 0.2–1.2)
Total Protein: 7 g/dL (ref 6.0–8.3)

## 2021-03-29 LAB — CBC WITH DIFFERENTIAL/PLATELET
Basophils Absolute: 0.1 10*3/uL (ref 0.0–0.1)
Basophils Relative: 1 % (ref 0.0–3.0)
Eosinophils Absolute: 0.1 10*3/uL (ref 0.0–0.7)
Eosinophils Relative: 0.9 % (ref 0.0–5.0)
HCT: 41.6 % (ref 36.0–46.0)
Hemoglobin: 14.3 g/dL (ref 12.0–15.0)
Lymphocytes Relative: 37 % (ref 12.0–46.0)
Lymphs Abs: 3.8 10*3/uL (ref 0.7–4.0)
MCHC: 34.3 g/dL (ref 30.0–36.0)
MCV: 91.6 fl (ref 78.0–100.0)
Monocytes Absolute: 1.1 10*3/uL — ABNORMAL HIGH (ref 0.1–1.0)
Monocytes Relative: 10.3 % (ref 3.0–12.0)
Neutro Abs: 5.2 10*3/uL (ref 1.4–7.7)
Neutrophils Relative %: 50.8 % (ref 43.0–77.0)
Platelets: 384 10*3/uL (ref 150.0–400.0)
RBC: 4.54 Mil/uL (ref 3.87–5.11)
RDW: 12.8 % (ref 11.5–15.5)
WBC: 10.2 10*3/uL (ref 4.0–10.5)

## 2021-03-29 LAB — LIPID PANEL
Cholesterol: 186 mg/dL (ref 0–200)
HDL: 65.2 mg/dL (ref >39.00)
LDL Cholesterol: 94 mg/dL (ref 0–99)
NonHDL: 120.6
Total CHOL/HDL Ratio: 3
Triglycerides: 134 mg/dL (ref 0.0–149.0)
VLDL: 26.8 mg/dL (ref 0.0–40.0)

## 2021-03-29 LAB — TSH: TSH: 2.64 u[IU]/mL (ref 0.35–4.50)

## 2021-04-10 ENCOUNTER — Ambulatory Visit: Payer: BC Managed Care – PPO | Admitting: Internal Medicine

## 2021-04-10 ENCOUNTER — Encounter (HOSPITAL_COMMUNITY): Payer: Self-pay

## 2021-04-10 ENCOUNTER — Ambulatory Visit (HOSPITAL_COMMUNITY)
Admission: EM | Admit: 2021-04-10 | Discharge: 2021-04-10 | Disposition: A | Discharge status: Home / Self Care | Payer: BC Managed Care – PPO | Attending: Student | Admitting: Student

## 2021-04-10 ENCOUNTER — Other Ambulatory Visit: Payer: Self-pay

## 2021-04-10 DIAGNOSIS — N3010 Interstitial cystitis (chronic) without hematuria: Secondary | ICD-10-CM

## 2021-04-10 DIAGNOSIS — N1831 Chronic kidney disease, stage 3a: Secondary | ICD-10-CM | POA: Diagnosis not present

## 2021-04-10 DIAGNOSIS — N3001 Acute cystitis with hematuria: Secondary | ICD-10-CM | POA: Diagnosis not present

## 2021-04-10 LAB — POCT URINALYSIS DIPSTICK, ED / UC
Glucose, UA: NEGATIVE mg/dL
Ketones, ur: NEGATIVE mg/dL
Nitrite: NEGATIVE
Protein, ur: NEGATIVE mg/dL
Specific Gravity, Urine: 1.025 (ref 1.005–1.030)
Urobilinogen, UA: 0.2 mg/dL (ref 0.0–1.0)
pH: 5.5 (ref 5.0–8.0)

## 2021-04-10 LAB — POC URINE PREG, ED: Preg Test, Ur: NEGATIVE

## 2021-04-10 MED ORDER — LIDOCAINE HCL (PF) 1 % IJ SOLN
INTRAMUSCULAR | Status: AC
  Filled 2021-04-10: qty 2

## 2021-04-10 MED ORDER — CIPROFLOXACIN HCL 500 MG PO TABS: 500.0000 mg | ORAL_TABLET | Freq: Two times a day (BID) | ORAL | 0 refills | Status: AC

## 2021-04-10 MED ORDER — CEFTRIAXONE SODIUM 500 MG IJ SOLR
INTRAMUSCULAR | Status: AC
  Filled 2021-04-10: qty 500

## 2021-04-10 MED ORDER — CEFTRIAXONE SODIUM 500 MG IJ SOLR
500.0000 mg | Freq: Once | INTRAMUSCULAR | Status: AC
  Administered 2021-04-10: 500 mg via INTRAMUSCULAR

## 2021-04-10 NOTE — ED Triage Notes (Signed)
Pt presents with complaints of having the urge to pee since this morning with no urine coming out when she tries to pee. Reports low pelvis discomfort constantly. Reports feeling like she emptied her bladder at 2 am but has not urinated since.

## 2021-04-10 NOTE — ED Provider Notes (Signed)
[facility]    CSN: [id number] Arrival date & time: 04/10/21  1601      History   Chief Complaint Chief Complaint  Patient presents with  . Urinary Urgency    HPI Teresa Campbell is a 50 y.o. female presenting with urinary symptoms.  Medical history interstitial cystitis, CKD stage IIIa, IBS, fibromyalgia, GERD, hemorrhoids, insomnia, migraines.  Notes 12 hours of urinary urgency, weak stream, and suprapubic pressure.  Also with some left-sided back pain that is new. Denies history of cystitis or pyelo in the past. Doesn't take any medications for her interstitial cystitis. Denies vaginal discharge, STI risk. Denies hematuria, dysuria, frequency,n/v/d, fevers/chills, abdnormal vaginal discharge.    HPI  Past Medical History:  Diagnosis Date  . Colon polyps   . Depression   . Fibromyalgia   . GERD (gastroesophageal reflux disease)   . Hemorrhoids   . Insomnia   . Migraine     Patient Active Problem List   Diagnosis Date Noted  . Onychomycosis 03/28/2021  . CKD (chronic kidney disease) stage 3, GFR 30-59 ml/min (HCC) 03/28/2021  . IC (interstitial cystitis) 03/28/2021  . Fibromyalgia 03/28/2021  . History of endometriosis 03/28/2021  . IBS (irritable bowel syndrome) 03/28/2021  . Chronic back pain 03/28/2021  . Chronic migraine without aura without status migrainosus, not intractable 11/03/2018  . Depression 11/03/2018  . Chronic insomnia 11/03/2018  . Spondylosis without myelopathy or radiculopathy, thoracic region 02/03/2018  . Hiatal hernia with GERD without esophagitis 11/25/2017  . Spondylosis without myelopathy or radiculopathy, cervical region 07/03/2016  . Neck pain 12/27/2015    History reviewed. No pertinent surgical history.  OB History   No obstetric history on file.      Home Medications    Prior to Admission medications   Medication Sig Start Date End Date Taking? Authorizing Provider  ciprofloxacin (CIPRO) 500 MG tablet Take 1 tablet  (500 mg total) by mouth every 12 (twelve) hours for 7 days. 04/10/21 04/17/21 Yes Rhys Martini, PA-C  AIMOVIG 140 MG/ML SOAJ ADMINISTER 1 ML UNDER THE SKIN EVERY 30 DAYS 10/07/20   Drema Dallas, DO  buPROPion (WELLBUTRIN XL) 300 MG 24 hr tablet TAKE 1 TABLET(300 MG) BY MOUTH DAILY 07/08/20   Olive Bass, FNP  Cholecalciferol (VITAMIN D3) 25 MCG (1000 UT) CAPS Take 1 capsule (1,000 Units total) by mouth daily. 03/28/21   Pincus Sanes, MD  ciclopirox (PENLAC) 8 % solution Apply topically at bedtime. Apply over nail and surrounding skin. Apply daily over previous coat. After seven (7) days, may remove with alcohol and continue cycle. 03/28/21   Pincus Sanes, MD  citalopram (CELEXA) 40 MG tablet TAKE 1 TABLET(40 MG) BY MOUTH DAILY 03/27/21   Olive Bass, FNP  HYDROcodone-acetaminophen (NORCO/VICODIN) 5-325 MG tablet TK 1 T PO Q 8 H PRN P FOR UP TO 30 DAYS 09/27/18   [provider]  NUVARING 0.12-0.015 MG/24HR vaginal ring  09/26/18   [provider]  omeprazole (PRILOSEC) 40 MG capsule Take 1 capsule (40 mg total) by mouth daily. 12/15/18   Olive Bass, FNP  ondansetron (ZOFRAN-ODT) 4 MG disintegrating tablet DISSOLVE 1 TABLET(4 MG) ON THE TONGUE EVERY 8 HOURS AS NEEDED FOR NAUSEA OR VOMITING 01/11/20   Everlena Cooper, Adam R, DO  rizatriptan (MAXALT) 10 MG tablet TAKE 1 TABLET BY MOUTH AS NEEDED FOR MIGRAINE. MAY REPEAT IN 2 HOURS IF NEEDED 12/23/20   Everlena Cooper, Adam R, DO  topiramate (TOPAMAX) 100 MG tablet TAKE  1 TABLET BY MOUTH TWICE DAILY 03/20/21   Everlena CooperJaffe, Adam R, DO  traZODone (DESYREL) 150 MG tablet TAKE 1 TABLET BY MOUTH EVERY NIGHT AS DIRECTED. MAY INCREASE TO 2 TABLETS EVERY NIGHT AS DIRECTED 03/20/21   Olive BassMurray, Nadean Montanaro Woodruff, FNP    Family History Family History  Problem Relation Age of Onset  . Arthritis Mother   . COPD Mother   . Depression Mother   . Hyperlipidemia Mother   . Miscarriages / [place] Mother   . Arthritis Father   . Depression Father    . Heart disease Father   . Hyperlipidemia Father   . Alcohol abuse Sister   . Depression Sister   . Drug abuse Sister   . Hyperlipidemia Sister   . Hypertension Sister   . Alcohol abuse Brother   . Depression Brother   . Drug abuse Brother   . Depression Daughter   . Alcohol abuse Maternal Grandmother   . Depression Maternal Grandmother   . Stroke Maternal Grandfather   . Arthritis Paternal Grandmother   . Diabetes Paternal Grandmother   . Stroke Paternal Grandfather   . Alcohol abuse Sister   . Depression Sister   . Drug abuse Sister   . Depression Brother     Social History Social History   Tobacco Use  . Smoking status: Former Games developermoker  . Smokeless tobacco: Never Used  Vaping Use  . Vaping Use: Never used  Substance Use Topics  . Alcohol use: Yes     Allergies   Cymbalta [duloxetine hcl], Lyrica [pregabalin], and Tramadol   Review of Systems Review of Systems  Constitutional: Negative for appetite change, chills, diaphoresis and fever.  Respiratory: Negative for shortness of breath.   Cardiovascular: Negative for chest pain.  Gastrointestinal: Positive for abdominal pain. Negative for blood in stool, constipation, diarrhea, nausea and vomiting.  Genitourinary: Positive for difficulty urinating, flank pain and urgency. Negative for decreased urine volume, dysuria, frequency, genital sores, hematuria, menstrual problem, pelvic pain, vaginal bleeding, vaginal discharge and vaginal pain.  Musculoskeletal: Negative for back pain.  Neurological: Negative for dizziness, weakness and light-headedness.  All other systems reviewed and are negative.    Physical Exam Triage Vital Signs ED Triage Vitals  Enc Vitals Group     BP 04/10/21 1047 105/71     Pulse Rate 04/10/21 1047 75     Resp 04/10/21 1047 19     Temp 04/10/21 1047 97.8 F (36.6 C)     Temp src --      SpO2 04/10/21 1047 97 %     Weight --      Height --      Head Circumference --      Peak Flow  --      Pain Score 04/10/21 1046 8     Pain Loc --      Pain Edu? --      Excl. in GC? --    No data found.  Updated Vital Signs BP 105/71   Pulse 75   Temp 97.8 F (36.6 C)   Resp 19   SpO2 97%   Visual Acuity Right Eye Distance:   Left Eye Distance:   Bilateral Distance:    Right Eye Near:   Left Eye Near:    Bilateral Near:     Physical Exam Vitals reviewed.  Constitutional:      General: She is not in acute distress.    Appearance: Normal appearance. She is not ill-appearing.  HENT:  Head: Normocephalic and atraumatic.     Mouth/Throat:     Mouth: Mucous membranes are moist.     Comments: Moist mucous membranes Eyes:     Extraocular Movements: Extraocular movements intact.     Pupils: Pupils are equal, round, and reactive to light.  Cardiovascular:     Rate and Rhythm: Normal rate and regular rhythm.     Heart sounds: Normal heart sounds.  Pulmonary:     Effort: Pulmonary effort is normal.     Breath sounds: Normal breath sounds. No wheezing, rhonchi or rales.  Abdominal:     General: Bowel sounds are normal. There is no distension.     Palpations: Abdomen is soft. There is no mass.     Tenderness: There is abdominal tenderness in the suprapubic area. There is no right CVA tenderness, left CVA tenderness, guarding or rebound. Negative signs include Murphy's sign, Rovsing's sign and McBurney's sign.     Comments: No CVAT on exam   Skin:    General: Skin is warm.     Capillary Refill: Capillary refill takes less than 2 seconds.     Comments: Good skin turgor  Neurological:     General: No focal deficit present.     Mental Status: She is alert and oriented to person, place, and time.  Psychiatric:        Mood and Affect: Mood normal.        Behavior: Behavior normal.        Thought Content: Thought content normal.        Judgment: Judgment normal.      UC Treatments / Results  Labs (all labs ordered are listed, but only abnormal results are  displayed) Labs Reviewed  POCT URINALYSIS DIPSTICK, ED / UC - Abnormal; Notable for the following components:      Result Value   Bilirubin Urine SMALL (*)    Hgb urine dipstick MODERATE (*)    Leukocytes,Ua SMALL (*)    All other components within normal limits  URINE CULTURE  POC URINE PREG, ED    EKG   Radiology No results found.  Procedures Procedures (including critical care time)  Medications Ordered in UC Medications  cefTRIAXone (ROCEPHIN) injection 500 mg (has no administration in time range)    Initial Impression / Assessment and Plan / UC Course  I have reviewed the triage vital signs and the nursing notes.  Pertinent labs & imaging results that were available during my care of the patient were reviewed by me and considered in my medical decision making (see chart for details).     This patient is a 50 year old female presenting with acute cystitis with hematuria.  She is afebrile and nontachycardic.  This patient does have significant suprapubic pressure on exam.  She endorses flank pain, but no CVAT on exam.  She does have a concurrent history of CKD stage IIIa and interstitial cystitis for which she takes no medications.  UA with moderate blood and small leuk. Culture sent.  Negative urine pregnancy.  Given comorbidities, will treat with ciprofloxacin and rocephin today.   Strict ED return precautions discussed. Patient verbalizes understanding and agreement.  Final Clinical Impressions(s) / UC Diagnoses   Final diagnoses:  Acute cystitis with hematuria  Stage 3a chronic kidney disease (HCC)  Interstitial cystitis     Discharge Instructions     -Ciprofloxacin twice daily for 7 days.  Avoid strenuous exercise while taking this medication. -Drink plenty of fluids to help flush bacteria  from your kidneys and bladder. -Follow-up with primary care if symptoms worsen or persist, or seek immediate medical attention if you develop severe abdominal pain,  back pain, fever/chills, etc.    ED Prescriptions    Medication Sig Dispense Auth. Provider   ciprofloxacin (CIPRO) 500 MG tablet Take 1 tablet (500 mg total) by mouth every 12 (twelve) hours for 7 days. 14 tablet Rhys Martini, PA-C     PDMP not reviewed this encounter.   Rhys Martini, PA-C 04/10/21 1148

## 2021-04-10 NOTE — Discharge Instructions (Addendum)
-  Ciprofloxacin twice daily for 7 days.  Avoid strenuous exercise while taking this medication. -Drink plenty of fluids to help flush bacteria from your kidneys and bladder. -Follow-up with primary care if symptoms worsen or persist, or seek immediate medical attention if you develop severe abdominal pain, back pain, fever/chills, etc.

## 2021-04-11 LAB — URINE CULTURE: Culture: NO GROWTH

## 2021-04-14 ENCOUNTER — Other Ambulatory Visit: Payer: Self-pay

## 2021-04-14 MED ORDER — RIZATRIPTAN BENZOATE 10 MG PO TABS: ORAL_TABLET | ORAL | 5 refills | Status: DC

## 2021-04-14 NOTE — Progress Notes (Signed)
Per pt she needs a refill on Maxalt.  Refill sent.

## 2021-04-19 ENCOUNTER — Other Ambulatory Visit: Payer: Self-pay

## 2021-04-19 MED ORDER — RIZATRIPTAN BENZOATE 10 MG PO TABS: ORAL_TABLET | ORAL | 5 refills | Status: DC

## 2021-04-19 NOTE — Progress Notes (Signed)
Per pharmacy need to verify instructions.  New script sent.

## 2021-04-21 ENCOUNTER — Other Ambulatory Visit: Payer: Self-pay | Admitting: Neurology

## 2021-04-26 DIAGNOSIS — M792 Neuralgia and neuritis, unspecified: Secondary | ICD-10-CM | POA: Diagnosis not present

## 2021-04-26 DIAGNOSIS — G894 Chronic pain syndrome: Secondary | ICD-10-CM | POA: Diagnosis not present

## 2021-04-26 DIAGNOSIS — M5416 Radiculopathy, lumbar region: Secondary | ICD-10-CM | POA: Diagnosis not present

## 2021-04-26 DIAGNOSIS — M47812 Spondylosis without myelopathy or radiculopathy, cervical region: Secondary | ICD-10-CM | POA: Diagnosis not present

## 2021-06-14 ENCOUNTER — Other Ambulatory Visit: Payer: Self-pay | Admitting: Family

## 2021-06-14 NOTE — Telephone Encounter (Signed)
Patient is requesting a refill of the following medications: Requested Prescriptions   Pending Prescriptions Disp Refills   traZODone (DESYREL) 150 MG tablet [Pharmacy Med Name: TRAZODONE 150MG  (HUNDRED-FIFTY) TAB] 90 tablet 1    Sig: TAKE 1 TABLET BY MOUTH EVERY NIGHT AS DIRECTED. MAY INCREASE TO 2 TABLETS EVERY NIGHT AS DIRECTED    Date of patient request: 06/14/21 Last office visit: 03/28/21 w/ 03/30/21 Date of last refill: 03/20/21 Last refill amount: 90 + 1 Follow up time period per chart: None yet

## 2021-06-21 DIAGNOSIS — M47812 Spondylosis without myelopathy or radiculopathy, cervical region: Secondary | ICD-10-CM | POA: Diagnosis not present

## 2021-07-12 IMAGING — CT CT ABD-PELV W/ CM
2 of 5 series · 16 of 46 positions shown, 18 images · IV contrast (OMNIPAQUE 300)
Comparison: None.

CLINICAL DATA: Lower abdominal pressure and discomfort for several
weeks. Chronic constipation. Possible diverticulitis.

EXAM:
CT ABDOMEN AND PELVIS WITH CONTRAST
TECHNIQUE: Multidetector CT imaging of the abdomen and pelvis was performed
using the standard protocol following bolus administration of
intravenous contrast.
CONTRAST:  100mL OMNIPAQUE IOHEXOL 300 MG/ML  SOLN

[Series 2: abd/pel w · axial · 0.63mm/px · z∈[-486,-106]mm · 13 of 86 slices shown, 15 images]
[im 5/86  soft-tissue]
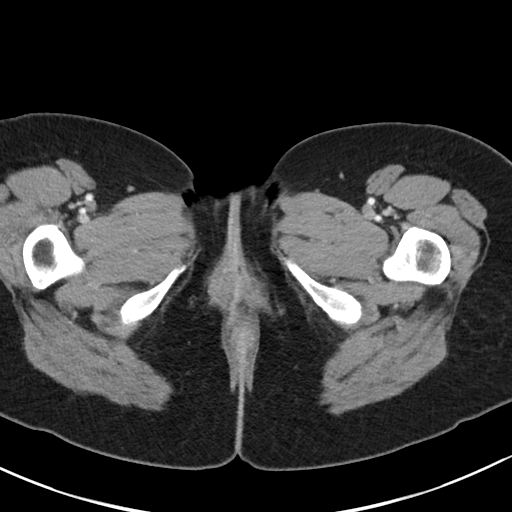
[im 5/86  bone]
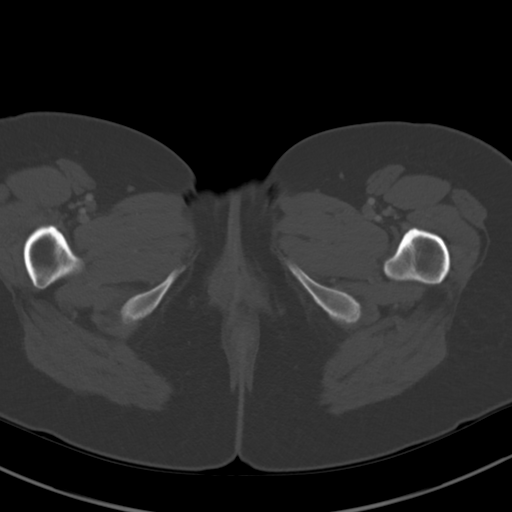
[im 14/86  soft-tissue]
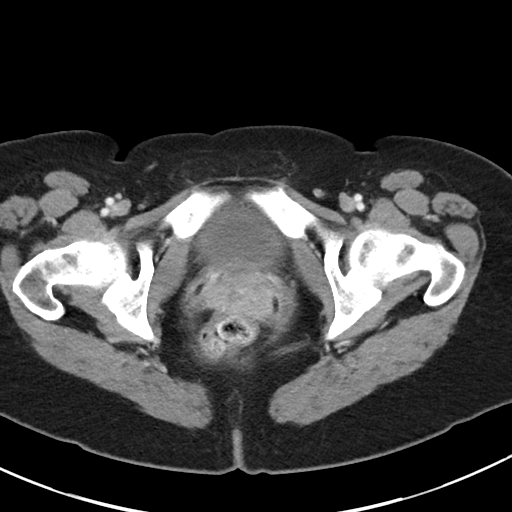
[im 18/86  soft-tissue]
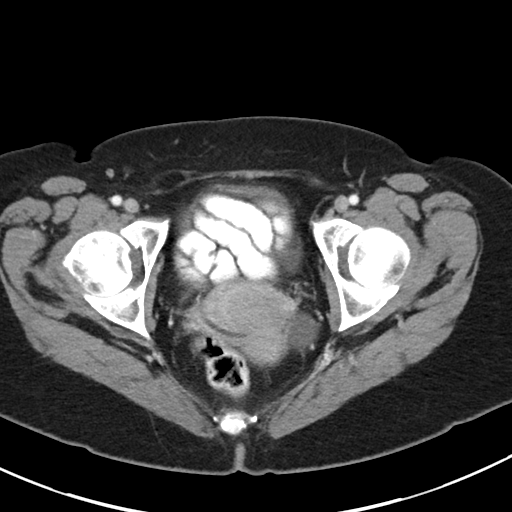
[im 23/86  soft-tissue]
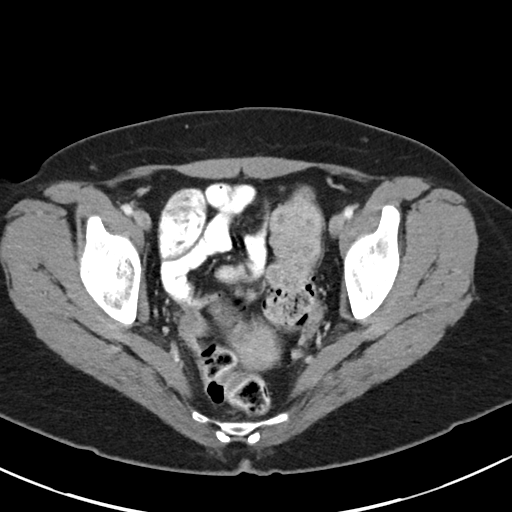
[im 32/86  soft-tissue]
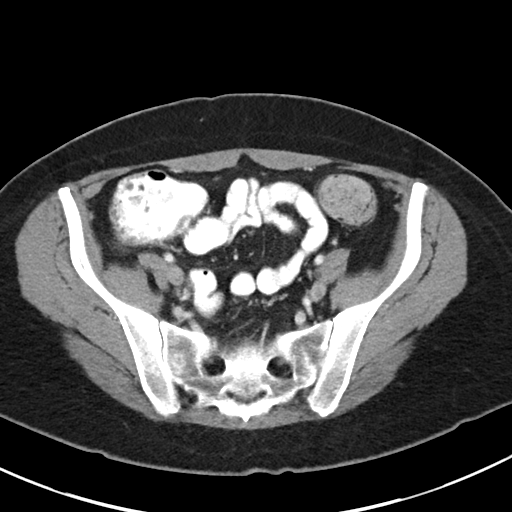
[im 36/86  soft-tissue]
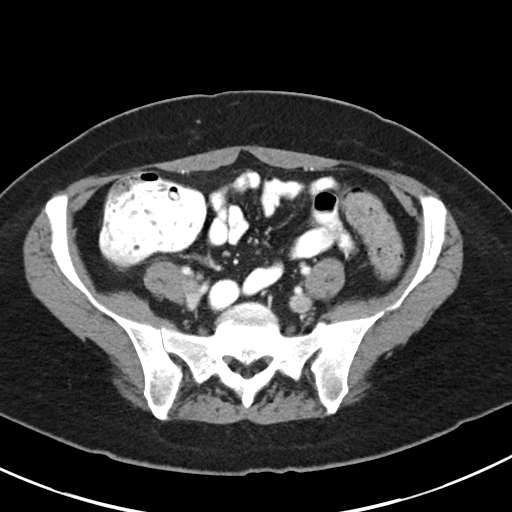
[im 45/86  soft-tissue]
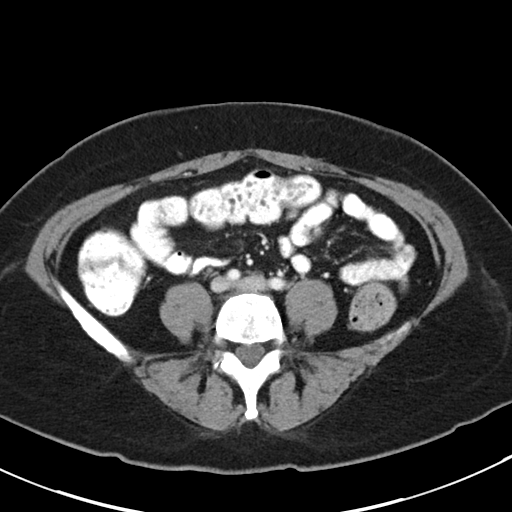
[im 50/86  soft-tissue]
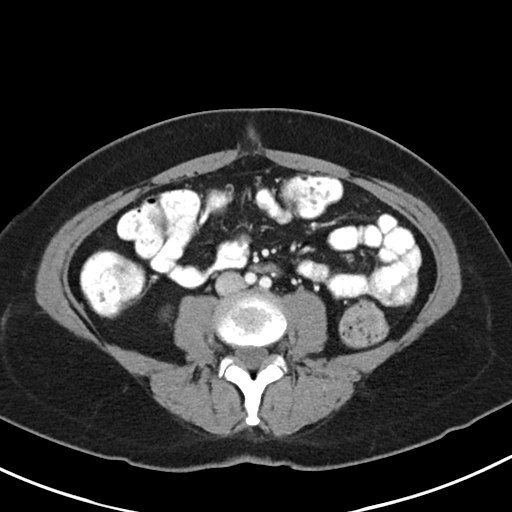
[im 54/86  soft-tissue]
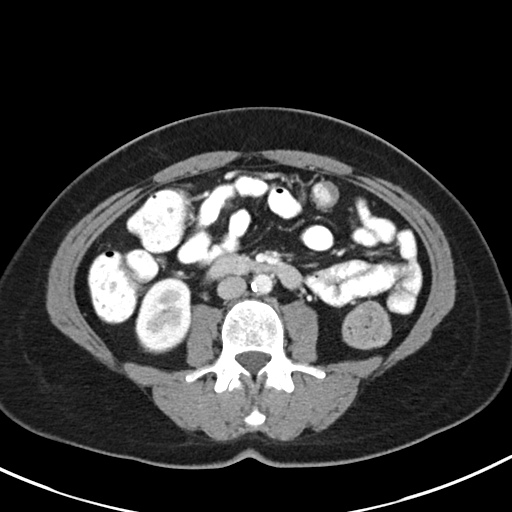
[im 54/86  bone]
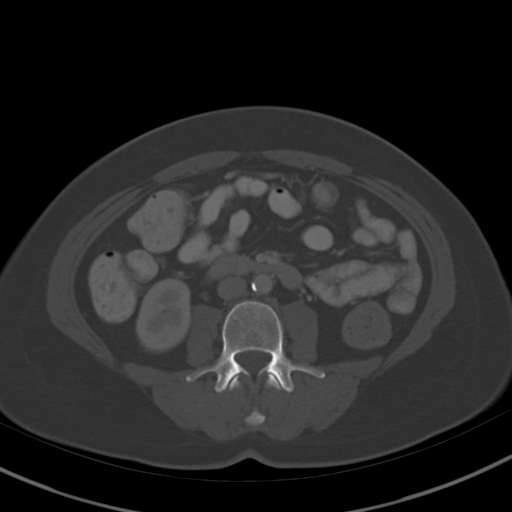
[im 63/86  soft-tissue]
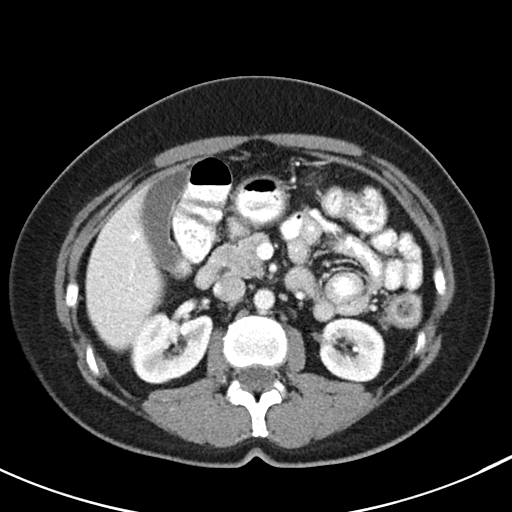
[im 68/86  soft-tissue]
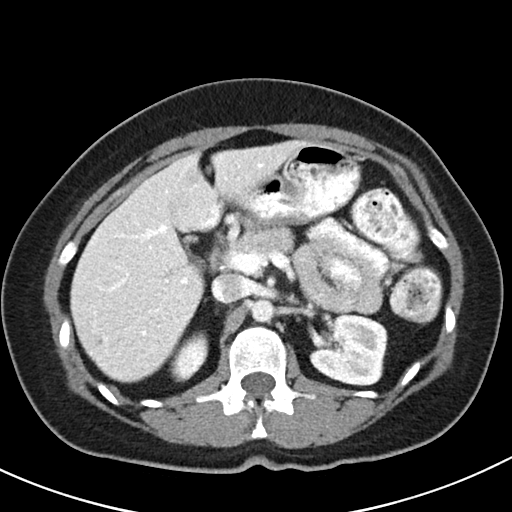
[im 72/86  soft-tissue]
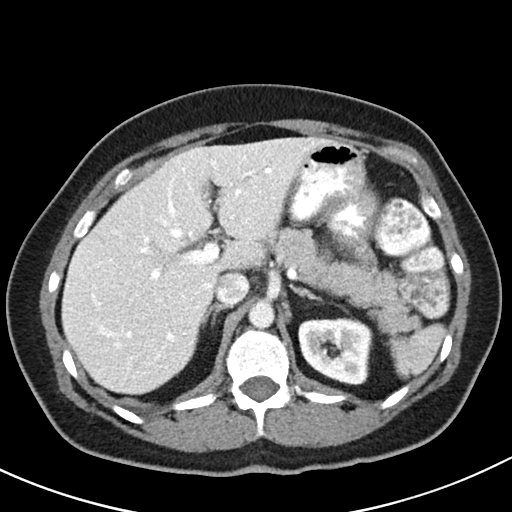
[im 81/86  soft-tissue]
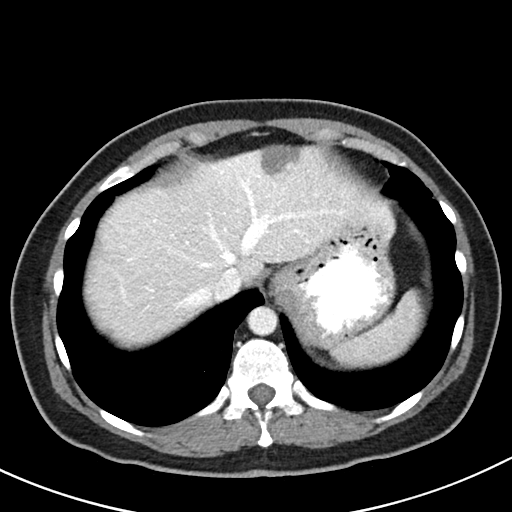

[Series 5: abd/pel w st · coronal · 0.65mm/px · 3 of 71 slices shown]
[im 24/71  soft-tissue]
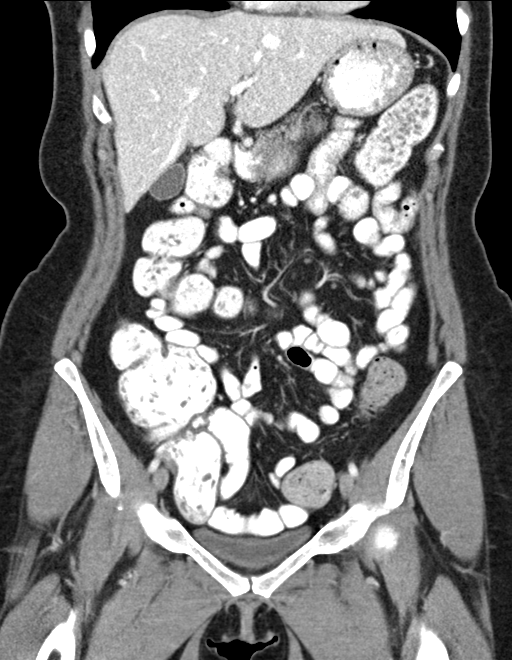
[im 32/71  soft-tissue]
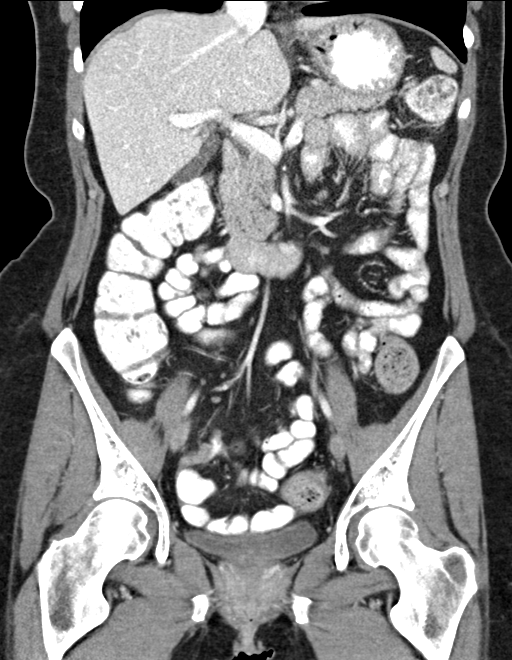
[im 39/71  soft-tissue]
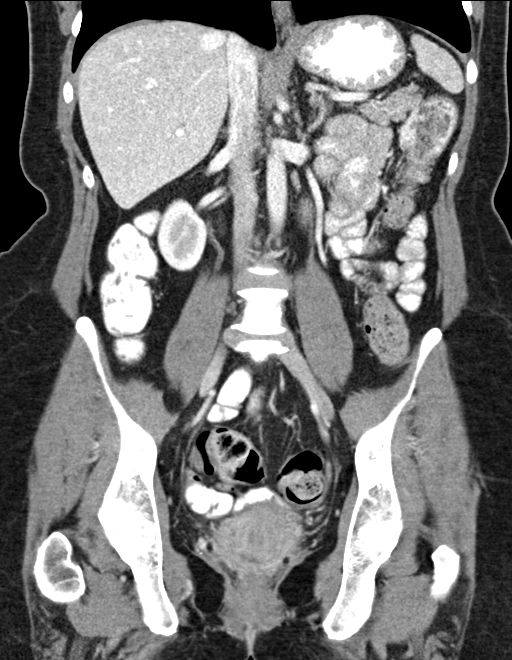

[16 of 46 positions shown; findings below may reference images not displayed]

FINDINGS: Lower chest: Clear lung bases.

Hepatobiliary: 4.1 cm mass in the left hepatic lobe with peripheral
discontinuous nodular enhancement which progresses on delayed
imaging. Additional subcentimeter hypodense lesions in the liver are
too small to fully characterize. Unremarkable gallbladder. No
biliary dilatation.

Pancreas: Unremarkable.

Spleen: Unremarkable.

Adrenals/Urinary Tract: Unremarkable adrenal glands. No evidence of
renal calculi or hydronephrosis. Subcentimeter hypodensity in the
right kidney, too small to fully characterize. Unremarkable bladder.

Stomach/Bowel: The stomach is unremarkable. There is a moderate
amount of stool in the colon. There is no evidence of bowel
obstruction or inflammation. The appendix is unremarkable.

Vascular/Lymphatic: Mild abdominal aortic atherosclerosis without
aneurysm. No enlarged lymph nodes.

Reproductive: Pessary in the vagina. Unremarkable uterus and right
ovary. 2.5 cm homogeneously hypoattenuating focus in the left ovary,
benign in appearance and likely a follicle.

Other: No intraperitoneal free fluid or free air.

Musculoskeletal: No acute osseous abnormality or suspicious osseous
lesion. Moderate disc degeneration at L4-5.
IMPRESSION: 1. No acute abnormality identified in the abdomen or pelvis.
2. Moderate colonic stool burden suggesting constipation.
3. Hepatic hemangioma.
4. Aortic Atherosclerosis ([id number]).

## 2021-07-13 ENCOUNTER — Other Ambulatory Visit: Payer: Self-pay | Admitting: Neurology

## 2021-07-21 DIAGNOSIS — M5416 Radiculopathy, lumbar region: Secondary | ICD-10-CM | POA: Diagnosis not present

## 2021-08-06 ENCOUNTER — Other Ambulatory Visit: Payer: Self-pay | Admitting: Family

## 2021-08-10 ENCOUNTER — Other Ambulatory Visit: Payer: Self-pay | Admitting: Neurology

## 2021-08-10 ENCOUNTER — Other Ambulatory Visit: Payer: Self-pay

## 2021-08-10 MED ORDER — TOPIRAMATE 100 MG PO TABS: 100.0000 mg | ORAL_TABLET | Freq: Two times a day (BID) | ORAL | 0 refills | Status: DC

## 2021-08-18 NOTE — Progress Notes (Signed)
NEUROLOGY FOLLOW UP OFFICE NOTE  Teresa Campbell [id number]  Assessment/Plan:   1.  Migraine with aura, without status migrainosus, not intractable 2.  Cervical spondylosis   1.  Migraine prevention:  Aimovig was effective but no longer covered by her insurance.  Does not qualify for the financial assistance program.  Would start Emgality.  However, she would still like to continue Aimovig because it was so effective - she will take 140mg  every 30 days.  She says she will find a way to afford it. Otherwise, we will start Emgality; Continue topiramate 100mg  BID 2.  Migraine rescue:  Rizatriptan 10mg  for severe migraines; ibuprofen with tizanidine for moderate/cervicogenic headaches 3.  Limit use of pain relievers to no more than 2 days out of week to prevent risk of rebound or medication-overuse headache. 4.  Keep headache diary 5.  Follow up 6 months  Subjective:  Teresa Campbell is a 50 year old right-handed woman with fibromyalgia and depression who follows up for migraines.   UPDATE: Off of CGRP inhibitor since May.  Insurance said no longer will cover Aimovig.  Tried to switch to Ajovy but they denied that as well.  Migraines are significantly worse. Intensity:  Moderate to severe Duration:  Few hours Frequency:  now averaging 13 headache days a month. Rescue:  For moderate:  Tizanidine, ibuprofen and ice; For severe:  Rizatriptan Current NSAIDS: none Current analgesics: hydrocodone (fibromyalgia) Current triptans: Rizatriptan 10 mg Current ergotamine:  none Current anti-emetic:  none Current muscle relaxants:  tizanidine 2mg  PRN (muscle spasms) Current anti-anxiolytic:  none Current sleep aide: Trazodone Current Antihypertensive medications:  none Current Antidepressant medications: citalopram 40 mg daily, bupropion 150 mg Current Anticonvulsant medications: Topiramate 100 mg twice daily (on higher doses she had hair thinning, weight loss) Current anti-CGRP:  none Current  Vitamins/Herbal/Supplements:  none Current Antihistamines/Decongestants:  meclizine Other therapy:  cervical radiofrequency ablations; PT neck; ketamine infusions for fibromyalgia. Hormone/birth control: NuvaRing   Caffeine:  1 cup of coffee daily Diet:  Drinks water all day.  Eats healthy Exercise:  routine Depression:  yes; Anxiety:  yes Other pain:  fibromyalgia, lumbar radiculopathy Sleep hygiene:  Okay with trazodone and exercise     HISTORY:  Migraines: Onset:  Childhood.  Missed school as a child.  Worse after starting her menstrual cycle. Location:  Bi-frontal Quality:  Pounding Initial Intensity:  Severe.  She denies new headache, thunderclap headache or severe headache that wakes her from sleep. Aura:  Flashing lights/blue colors Prodrome:  none Postdrome:  fatigue Associated symptoms:  Photophobia, phonophobia, nausea, osmophobia, sometimes vomiting.  She denies associated unilateral numbness or weakness. Initial Duration:  2 days with Maxalt (1 hour if taken at onset) Initial Frequency:  2 migraines past 30 days (15-17 a month prior to topiramate) Initial Frequency of abortive medication: 2 days past month Triggers: increased depression Relieving factors:  Ice pack on head and heat to back of neck Activity:  aggravates   She has neck pain and mild to moderate right sided jaw pain radiating to the temple.  She was evaluated for TMJ   Past NSAIDS:  Ibuprofen (kidney problems), naproxen, Toradol shot Past analgesics:  Tylenol, Excedrin Past abortive triptans: Zomig, sumatriptan tablet Past abortive ergotamine:  none Past muscle relaxants:  none Past anti-emetic:  none Past antihypertensive medications:  none Past antidepressant medications:  Amitriptyline 25mg  Past anticonvulsant medications:  gabapentin Past anti-CGRP:  Aimovig 140mg  Past vitamins/Herbal/Supplements:  none Past antihistamines/decongestants:  none Other past therapies:  Botox (effective.  However,  she was having trouble getting the Botox due to cost), Gamacor vagus nerve stimulator (abortive treatment, effective), cervical nerve ablations (helpful).   Family history of headache:  Father, paternal grandfather, 2 aunts   Transient Spells: In June 2020, she was in the grocery store when she started feeling dizzy.  She couldn't hear what the lady at check out was saying.  Everything was cloudy and her legs felt they were going to buckle.  She felt hot and diaphoretic.  She sat in her car with the air conditioner blowing, drank a water and ate a honey bun.  She felt better but was fatigued for the rest of the day.  She reports similar events in the past but not as severe since childhood.  She did have another episode the next day as well, less severe.  She drank orange juice and peanut butter crackers and it resolved.  No associated headaches.  She has had other events as well.  When she was around 41, she was at work and she became confused.  She couldn't understand her coworkers and she was lethargic.  She was unable to communicate with them.  She went to the ED where testing was unremarkable.  She had a similar event about a year ago.  She is unsure if associated with headaches because she suffers from so many headaches.  Awake and asleep EEG from 07/01/2019 was normal.  MRI of brain with and without contrast from 07/10/2019 was normal  PAST MEDICAL HISTORY: Past Medical History:  Diagnosis Date   Colon polyps    Depression    Fibromyalgia    GERD (gastroesophageal reflux disease)    Hemorrhoids    Insomnia    Migraine     MEDICATIONS: Current Outpatient Medications on File Prior to Visit  Medication Sig Dispense Refill   AIMOVIG 140 MG/ML SOAJ ADMINISTER 1 ML UNDER THE SKIN EVERY 30 DAYS 1 mL 11   buPROPion (WELLBUTRIN XL) 300 MG 24 hr tablet TAKE 1 TABLET(300 MG) BY MOUTH DAILY 90 tablet 3   Cholecalciferol (VITAMIN D3) 25 MCG (1000 UT) CAPS Take 1 capsule (1,000 Units total) by mouth  daily. 60 capsule    ciclopirox (PENLAC) 8 % solution Apply topically at bedtime. Apply over nail and surrounding skin. Apply daily over previous coat. After seven (7) days, may remove with alcohol and continue cycle. 6.6 mL 3   citalopram (CELEXA) 40 MG tablet TAKE 1 TABLET(40 MG) BY MOUTH DAILY 90 tablet 1   HYDROcodone-acetaminophen (NORCO/VICODIN) 5-325 MG tablet TK 1 T PO Q 8 H PRN P FOR UP TO 30 DAYS  0   NUVARING 0.12-0.015 MG/24HR vaginal ring      omeprazole (PRILOSEC) 40 MG capsule Take 1 capsule (40 mg total) by mouth daily. 90 capsule 3   ondansetron (ZOFRAN-ODT) 4 MG disintegrating tablet DISSOLVE 1 TABLET(4 MG) ON THE TONGUE EVERY 8 HOURS AS NEEDED FOR NAUSEA OR VOMITING 20 tablet 3   rizatriptan (MAXALT) 10 MG tablet Take 1 tab at the earliest onset of a Migraine. May repeat in 2 hours if needed 10 tablet 5   topiramate (TOPAMAX) 100 MG tablet Take 1 tablet (100 mg total) by mouth 2 (two) times daily. 60 tablet 0   traZODone (DESYREL) 150 MG tablet TAKE 1 TABLET BY MOUTH EVERY NIGHT AS DIRECTED. MAY INCREASE TO 2 TABLETS EVERY NIGHT AS DIRECTED 90 tablet 1   No current facility-administered medications on file prior to visit.    ALLERGIES: Allergies  Allergen Reactions   Cymbalta [Duloxetine Hcl]     Ineffective   Lyrica [Pregabalin] Other (See Comments)    Ineffective   Tramadol     Ineffective     FAMILY HISTORY: Family History  Problem Relation Age of Onset   Arthritis Mother    COPD Mother    Depression Mother    Hyperlipidemia Mother    Miscarriages / [place] Mother    Arthritis Father    Depression Father    Heart disease Father    Hyperlipidemia Father    Alcohol abuse Sister    Depression Sister    Drug abuse Sister    Hyperlipidemia Sister    Hypertension Sister    Alcohol abuse Brother    Depression Brother    Drug abuse Brother    Depression Daughter    Alcohol abuse Maternal Grandmother    Depression Maternal Grandmother    Stroke  Maternal Grandfather    Arthritis Paternal Grandmother    Diabetes Paternal Grandmother    Stroke Paternal Grandfather    Alcohol abuse Sister    Depression Sister    Drug abuse Sister    Depression Brother       Objective:  Blood pressure 98/65, pulse 72, height 5\' 5"  (1.651 m), weight 155 lb (70.3 kg), SpO2 97 %. General: No acute distress.  Patient appears well-groomed.     , DO  CC: Shon Millet, MD

## 2021-08-21 ENCOUNTER — Ambulatory Visit (INDEPENDENT_AMBULATORY_CARE_PROVIDER_SITE_OTHER): Payer: BC Managed Care – PPO | Admitting: Neurology

## 2021-08-21 ENCOUNTER — Other Ambulatory Visit: Payer: Self-pay

## 2021-08-21 ENCOUNTER — Encounter: Payer: Self-pay | Admitting: Neurology

## 2021-08-21 VITALS — BP 98/65 | HR 72 | Ht 65.0 in | Wt 155.0 lb

## 2021-08-21 DIAGNOSIS — G43109 Migraine with aura, not intractable, without status migrainosus: Secondary | ICD-10-CM

## 2021-08-21 MED ORDER — AIMOVIG 140 MG/ML ~~LOC~~ SOAJ: 140.0000 mg | SUBCUTANEOUS | 5 refills | Status: DC

## 2021-08-21 NOTE — Patient Instructions (Signed)
Will try to get you back on Aimovig.  Otherwise, will try Emgality Rizatriptan as needed.   Limit use of pain relievers to no more than 2 days out of week to prevent risk of rebound or medication-overuse headache. Keep headache diary Follow up 6 months

## 2021-08-24 DIAGNOSIS — R42 Dizziness and giddiness: Secondary | ICD-10-CM | POA: Diagnosis not present

## 2021-08-24 DIAGNOSIS — G43109 Migraine with aura, not intractable, without status migrainosus: Secondary | ICD-10-CM | POA: Diagnosis not present

## 2021-08-25 DIAGNOSIS — G43101 Migraine with aura, not intractable, with status migrainosus: Secondary | ICD-10-CM | POA: Diagnosis not present

## 2021-08-25 DIAGNOSIS — M792 Neuralgia and neuritis, unspecified: Secondary | ICD-10-CM | POA: Diagnosis not present

## 2021-08-25 DIAGNOSIS — G894 Chronic pain syndrome: Secondary | ICD-10-CM | POA: Diagnosis not present

## 2021-08-29 ENCOUNTER — Encounter: Payer: Self-pay | Admitting: Internal Medicine

## 2021-08-29 ENCOUNTER — Telehealth (INDEPENDENT_AMBULATORY_CARE_PROVIDER_SITE_OTHER): Payer: BC Managed Care – PPO | Admitting: Internal Medicine

## 2021-08-29 DIAGNOSIS — L03113 Cellulitis of right upper limb: Secondary | ICD-10-CM | POA: Insufficient documentation

## 2021-08-29 MED ORDER — SULFAMETHOXAZOLE-TRIMETHOPRIM 800-160 MG PO TABS: 1.0000 | ORAL_TABLET | Freq: Two times a day (BID) | ORAL | 0 refills | Status: AC

## 2021-08-29 NOTE — Assessment & Plan Note (Signed)
Acute Symptoms and limited exam via video visit is consistent with cellulitis.  She denies any area of fluctuance so no abscess is present Secondary to multiple IV/blood sticks within 2 days Start Bactrim DS 800-160 mg twice daily x10 days Discussed the importance of monitoring the infection closely over the next 24-48 hours-there should be improvement by 48 hours and if there is any worsening or if she develops any concerning symptoms advised that she needs to call immediately

## 2021-08-29 NOTE — Progress Notes (Signed)
Virtual Visit via Video Note  I connected with Teresa Campbell on 08/29/21 at  2:40 PM EDT by a video enabled telemedicine application and verified that I am speaking with the correct person using two identifiers.   I discussed the limitations of evaluation and management by telemedicine and the availability of in person appointments. The patient expressed understanding and agreed to proceed.  Present for the visit:  Myself, Dr Cheryll Cockayne, Teresa Campbell.  The patient is currently at home and I am in the office.    No referring provider.    History of Present Illness: This is an acute visit for ? Cellulitis.   She went to the ED last Thursday for a severe migraine for 21 days.  She had a ketamine procedure done on Friday.   Sunday night her arm was hurting really bad.  She was poked 7 times in that arm in those two days and she figured it was just related to that.  Monday the arm hurt all day - throbbing pain.   It was warm and hard.   The redness has expanded.  The arm is burning and throbbing.  She is concerned she may have cellulitis.    Review of Systems  Constitutional:  Negative for chills and fever.  Skin:        Redness in right arm  Neurological:  Positive for tingling (right fingers).     Social History   Socioeconomic History   Marital status: Married    Spouse name: Tinnie Gens   Number of children: 1   Years of education: Not on file   Highest education level: Bachelor's degree (e.g., BA, AB, BS)  Occupational History   Occupation: Risk manager: [employer]  Tobacco Use   Smoking status: Former   Smokeless tobacco: Never  Building services engineer Use: Never used  Substance and Sexual Activity   Alcohol use: Yes   Drug use: Not on file   Sexual activity: Yes    Birth control/protection: Inserts  Other Topics Concern   Not on file  Social History Narrative   Patient is right-handed. She lives with her husband in a one level home with a basement.  She drinks one cup of coffee a day. She goes to [employer] daily.   Social Determinants of Health   Financial Resource Strain: Not on file  Food Insecurity: Not on file  Transportation Needs: Not on file  Physical Activity: Not on file  Stress: Not on file  Social Connections: Not on file     Observations/Objective: Appears well in NAD Breathing normally Skin appears warm and dry Right arm there is some erythema and no streaking line of erythema that extends up her arm to at least her mid upper arm-video quality is poor and not able to see in great detail.  There is some swelling.  She states warmth and firmness or hardness of the skin.  Assessment and Plan:  See Problem List for Assessment and Plan of chronic medical problems.   Follow Up Instructions:    I discussed the assessment and treatment plan with the patient. The patient was provided an opportunity to ask questions and all were answered. The patient agreed with the plan and demonstrated an understanding of the instructions.   The patient was advised to call back or seek an in-person evaluation if the symptoms worsen or if the condition fails to improve as anticipated.    Pincus Sanes, MD

## 2021-08-31 ENCOUNTER — Encounter: Payer: Self-pay | Admitting: Internal Medicine

## 2021-09-01 ENCOUNTER — Telehealth: Payer: Self-pay

## 2021-09-01 NOTE — Telephone Encounter (Signed)
New message   Tobe Sos (Key: [id number]) Aimovig 140MG /ML auto-injectors   Form Blue Form (CB) Created 11 minutes ago Sent to Plan 8 minutes ago Plan Response 8 minutes ago Submit Clinical Questions less than a minute ago Determination Wait for Determination Please wait for Advertising account executive to return a determination.

## 2021-09-01 NOTE — Telephone Encounter (Signed)
New message    Teresa Campbell Key: [id number]  Drug Emgality 120MG /ML auto-injectors (migraine)  Status Sent to Plan today Form [employer] [place] [employer] Electronic Request Form (CB) Need help? Call at [phone]

## 2021-09-07 NOTE — Telephone Encounter (Signed)
F/u   Teresa Campbell Key: [id number] help? Call us at [phone]  Outcome Denied on September 23  Your request has been denied  Drug Emgality 120MG /ML auto-injectors (migraine)  FormBlue Form (CB)

## 2021-09-07 NOTE — Telephone Encounter (Signed)
F/u   Resubmit for Emgality 120 mg   Teresa Campbell Key: [id number] help? Call us at [phone] Status Sent to [employer] Drug Emgality 120MG /ML auto-injectors (migraine)

## 2021-09-12 NOTE — Telephone Encounter (Signed)
F/u   Received fax notification from [employer] [place]   Emgality 120 mg is denied

## 2021-09-13 ENCOUNTER — Other Ambulatory Visit: Payer: Self-pay | Admitting: Neurology

## 2021-09-14 NOTE — Telephone Encounter (Signed)
F/u  Received fax from [facility] [place] approval    Medication Emgality  120 mg   Effective date of authorization 09/01/21 to  11/23/2021 .

## 2021-09-25 ENCOUNTER — Other Ambulatory Visit: Payer: Self-pay | Admitting: Family

## 2021-10-18 ENCOUNTER — Other Ambulatory Visit: Payer: Self-pay | Admitting: Neurology

## 2021-11-15 ENCOUNTER — Other Ambulatory Visit: Payer: Self-pay | Admitting: Internal Medicine

## 2021-12-06 ENCOUNTER — Other Ambulatory Visit: Payer: Self-pay | Admitting: Internal Medicine

## 2022-01-18 DIAGNOSIS — M792 Neuralgia and neuritis, unspecified: Secondary | ICD-10-CM | POA: Diagnosis not present

## 2022-01-18 DIAGNOSIS — Z5181 Encounter for therapeutic drug level monitoring: Secondary | ICD-10-CM | POA: Diagnosis not present

## 2022-01-18 DIAGNOSIS — M47812 Spondylosis without myelopathy or radiculopathy, cervical region: Secondary | ICD-10-CM | POA: Diagnosis not present

## 2022-01-18 DIAGNOSIS — Z79899 Other long term (current) drug therapy: Secondary | ICD-10-CM | POA: Diagnosis not present

## 2022-01-18 DIAGNOSIS — M5416 Radiculopathy, lumbar region: Secondary | ICD-10-CM | POA: Diagnosis not present

## 2022-01-18 DIAGNOSIS — G43101 Migraine with aura, not intractable, with status migrainosus: Secondary | ICD-10-CM | POA: Diagnosis not present

## 2022-02-02 DIAGNOSIS — Z79899 Other long term (current) drug therapy: Secondary | ICD-10-CM | POA: Diagnosis not present

## 2022-02-02 DIAGNOSIS — M797 Fibromyalgia: Secondary | ICD-10-CM | POA: Diagnosis not present

## 2022-02-02 DIAGNOSIS — Z5181 Encounter for therapeutic drug level monitoring: Secondary | ICD-10-CM | POA: Diagnosis not present

## 2022-02-02 DIAGNOSIS — G894 Chronic pain syndrome: Secondary | ICD-10-CM | POA: Diagnosis not present

## 2022-02-02 DIAGNOSIS — M792 Neuralgia and neuritis, unspecified: Secondary | ICD-10-CM | POA: Diagnosis not present

## 2022-02-02 DIAGNOSIS — G43101 Migraine with aura, not intractable, with status migrainosus: Secondary | ICD-10-CM | POA: Diagnosis not present

## 2022-02-12 ENCOUNTER — Other Ambulatory Visit: Payer: Self-pay | Admitting: Neurology

## 2022-02-12 ENCOUNTER — Telehealth: Payer: Self-pay

## 2022-02-12 NOTE — Telephone Encounter (Signed)
New message   Your information has been submitted to [facility]. [employer] [place] will review the request and notify you of the determination decision directly, typically within 72 hours of receiving all information.  You will also receive your request decision electronically. To check for an update later, open this request again from your dashboard.  If [employer] has not responded within the specified timeframe or if you have any questions about your PA submission, contact [employer] [place] directly at [phone].  Tobe Sos (Key: [id number]) Aimovig 140MG /ML auto-injectors   Form Blue Form (CB) Created 2 days ago Sent to Plan 2 minutes ago Plan Response 2 minutes ago Submit Clinical Questions less than a minute ago Determination Wait for Determination Please wait for Advertising account executive to return a determination.

## 2022-02-16 DIAGNOSIS — M5416 Radiculopathy, lumbar region: Secondary | ICD-10-CM | POA: Diagnosis not present

## 2022-02-21 DIAGNOSIS — M47812 Spondylosis without myelopathy or radiculopathy, cervical region: Secondary | ICD-10-CM | POA: Diagnosis not present

## 2022-02-27 NOTE — Progress Notes (Signed)
? ?NEUROLOGY FOLLOW UP OFFICE NOTE ? ?Teresa Campbell ?[id number] ? ?Assessment/Plan:  ? ?1.  Migraine with aura, without status migrainosus, not intractable ?2.  Cervical spondylosis ?3  Recent headache sounds cervicogenic/occipital neuralgia, now gradually resolving. ?  ?1.  Migraine prevention:  Continue Aimovig 140mg  every 28 days.  If headaches do not improve, will consider changing to another CGRP inhibitor; Continue topiramate 100mg  BID ?2.  Migraine rescue:  Rizatriptan 10mg  for severe migraines.  I will also have her try Nurtec as well, which may be more effective if she is unable to treat the migraine early; ibuprofen with tizanidine for moderate/cervicogenic headaches.  Zofran ODT for nausea. ?3.  Limit use of pain relievers to no more than 2 days out of week to prevent risk of rebound or medication-overuse headache. ?4.  Keep headache diary ?5.  Follow up 6 months ?  ?Subjective:  ?Teresa Campbell is a 51 year old right-handed woman with fibromyalgia and depression who follows up for migraines. ?  ?UPDATE: ?Sent a PA to continue Aimovig.  Usually well-controlled.   ?Intensity:  Moderate to severe ?Duration:  less than hour if takes rizatriptan early enough.  Otherwise several hours. ?Frequency:  3-4 a month on average. ?However, she has had worse migraines over the past month.  They may last 3 days.  Not sure why, maybe weather.  Nausea is more severe.  She has had about 12 headaches the last 30 days.  She has had a lingering headache for last 3 days.  It started after waking up from a nap and felt severe bilateral jaw pain and severe nerve pain in left occipital region.  Since she was unable to treat early, rizatriptan was ineffective.  Also having GERD and nauseous.   ?Rescue:  For moderate:  Tizanidine, ibuprofen and ice; For severe:  Rizatriptan ?Current NSAIDS: Excedrin Migraine ?Current analgesics: hydrocodone (fibromyalgia) ?Current triptans: Rizatriptan 10 mg ?Current ergotamine:  none ?Current  anti-emetic:  ondansetron ODT 4mg   ?Current muscle relaxants:  tizanidine 2mg  PRN (muscle spasms) ?Current anti-anxiolytic:  none ?Current sleep aide: Trazodone ?Current Antihypertensive medications:  none ?Current Antidepressant medications: citalopram 40 mg daily, bupropion 150 mg ?Current Anticonvulsant medications: Topiramate 100 mg twice daily (on higher doses she had hair thinning, weight loss) ?Current anti-CGRP:  none ?Current Vitamins/Herbal/Supplements:  B12 ?Current Antihistamines/Decongestants:  meclizine ?Other therapy:  cervical radiofrequency ablations; PT neck; ketamine infusions for fibromyalgia, cold compress, heating pad. ?Hormone/birth control: NuvaRing ?  ?Caffeine:  1 cup of coffee daily ?Diet:  Drinks water all day.  Eats healthy ?Exercise:  routine ?Depression:  yes; Anxiety:  yes ?Other pain:  fibromyalgia, lumbar radiculopathy ?Sleep hygiene:  Okay with trazodone and exercise ?  ?  ?HISTORY:  ?Migraines: ?Onset:  Childhood.  Missed school as a child.  Worse after starting her menstrual cycle. ?Location:  Bi-frontal ?Quality:  Pounding ?Initial Intensity:  Severe.  She denies new headache, thunderclap headache or severe headache that wakes her from sleep. ?Aura:  Flashing lights/blue colors ?Prodrome:  none ?Postdrome:  fatigue ?Associated symptoms:  Photophobia, phonophobia, nausea, osmophobia, sometimes vomiting.  She denies associated unilateral numbness or weakness. ?Initial Duration:  2 days with Maxalt (1 hour if taken at onset) ?Initial Frequency:  2 migraines past 30 days (15-17 a month prior to topiramate) ?Initial Frequency of abortive medication: 2 days past month ?Triggers: increased depression ?Relieving factors:  Ice pack on head and heat to back of neck ?Activity:  aggravates ?  ?She has neck pain and mild to moderate right  sided jaw pain radiating to the temple.  She was evaluated for TMJ ?  ?Past NSAIDS:  Ibuprofen (kidney problems), naproxen, Toradol shot ?Past analgesics:   Tylenol, Excedrin ?Past abortive triptans: Zomig, sumatriptan tablet ?Past abortive ergotamine:  none ?Past muscle relaxants:  none ?Past anti-emetic:  none ?Past antihypertensive medications:  none ?Past antidepressant medications:  Amitriptyline 25mg  ?Past anticonvulsant medications:  gabapentin ?Past anti-CGRP:  none ?Past vitamins/Herbal/Supplements:  none ?Past antihistamines/decongestants:  none ?Other past therapies:  Botox (effective.  However, she was having trouble getting the Botox due to cost), Gamacor vagus nerve stimulator (abortive treatment, effective), cervical nerve ablations (helpful). ?  ?Family history of headache:  Father, paternal grandfather, 2 aunts ?  ?Transient Spells: ?In June 2020, she was in the grocery store when she started feeling dizzy.  She couldn't hear what the lady at check out was saying.  Everything was cloudy and her legs felt they were going to buckle.  She felt hot and diaphoretic.  She sat in her car with the air conditioner blowing, drank a water and ate a honey bun.  She felt better but was fatigued for the rest of the day.  She reports similar events in the past but not as severe since childhood.  She did have another episode the next day as well, less severe.  She drank orange juice and peanut butter crackers and it resolved.  No associated headaches.  She has had other events as well.  When she was around 65, she was at work and she became confused.  She couldn't understand her coworkers and she was lethargic.  She was unable to communicate with them.  She went to the ED where testing was unremarkable.  She had a similar event about a year ago.  She is unsure if associated with headaches because she suffers from so many headaches.  Awake and asleep EEG from 07/01/2019 was normal.  MRI of brain with and without contrast from 07/10/2019 was normal ? ?PAST MEDICAL HISTORY: ?Past Medical History:  ?Diagnosis Date  ? Colon polyps   ? Depression   ? Fibromyalgia   ? GERD  (gastroesophageal reflux disease)   ? Hemorrhoids   ? Insomnia   ? Migraine   ? ? ?MEDICATIONS: ?Current Outpatient Medications on File Prior to Visit  ?Medication Sig Dispense Refill  ? AIMOVIG 140 MG/ML SOAJ ADMINISTER 1 ML UNDER THE SKIN EVERY 30 DAYS 1 mL 0  ? buPROPion (WELLBUTRIN XL) 300 MG 24 hr tablet TAKE 1 TABLET(300 MG) BY MOUTH DAILY 90 tablet 3  ? Cholecalciferol (VITAMIN D3) 25 MCG (1000 UT) CAPS Take 1 capsule (1,000 Units total) by mouth daily. 60 capsule   ? ciclopirox (PENLAC) 8 % solution APPLY AT BEDTIME. APPLY OVER NAIL AND SURROUNDING SKIN. APPLY DAILY OVER PREVIOUS CAOT. AFTER 7 DAYS, MAY REMOVE WITH ALCOHOL AND CONTINUE CYCLE 6.6 mL 3  ? citalopram (CELEXA) 40 MG tablet TAKE 1 TABLET(40 MG) BY MOUTH DAILY 90 tablet 1  ? HYDROcodone-acetaminophen (NORCO/VICODIN) 5-325 MG tablet TK 1 T PO Q 8 H PRN P FOR UP TO 30 DAYS  0  ? NUVARING 0.12-0.015 MG/24HR vaginal ring     ? omeprazole (PRILOSEC) 40 MG capsule Take 1 capsule (40 mg total) by mouth daily. 90 capsule 3  ? ondansetron (ZOFRAN-ODT) 4 MG disintegrating tablet DISSOLVE 1 TABLET(4 MG) ON THE TONGUE EVERY 8 HOURS AS NEEDED FOR NAUSEA OR VOMITING 20 tablet 3  ? rizatriptan (MAXALT) 10 MG tablet TAKE 1 TABLET BY  MOUTH AT ONSET OF MIGRAINE. MAY REPEAT IN 2 HOURS AS NEEDED 10 tablet 0  ? topiramate (TOPAMAX) 100 MG tablet TAKE 1 TABLET(100 MG) BY MOUTH TWICE DAILY 60 tablet 5  ? traZODone (DESYREL) 150 MG tablet TAKE 1 TABLET BY MOUTH EVERY NIGHT AS DIRECTED. MAY INCREASE TO 2 TABLETS EVERY NIGHT AS DIRECTED 90 tablet 1  ? ?No current facility-administered medications on file prior to visit.  ? ? ?ALLERGIES: ?Allergies  ?Allergen Reactions  ? Cymbalta [Duloxetine Hcl]   ?  Ineffective  ? Lyrica [Pregabalin] Other (See Comments)  ?  Ineffective  ? Tramadol   ?  Ineffective ?  ? ? ?FAMILY HISTORY: ?Family History  ?Problem Relation Age of Onset  ? Arthritis Mother   ? COPD Mother   ? Depression Mother   ? Hyperlipidemia Mother   ? Miscarriages /  [place] Mother   ? Arthritis Father   ? Depression Father   ? Heart disease Father   ? Hyperlipidemia Father   ? Alcohol abuse Sister   ? Depression Sister   ? Drug abuse Sister   ? Hyperlipidemia Sister

## 2022-02-28 ENCOUNTER — Other Ambulatory Visit: Payer: Self-pay

## 2022-02-28 ENCOUNTER — Encounter: Payer: Self-pay | Admitting: Neurology

## 2022-02-28 ENCOUNTER — Ambulatory Visit (INDEPENDENT_AMBULATORY_CARE_PROVIDER_SITE_OTHER): Payer: BC Managed Care – PPO | Admitting: Neurology

## 2022-02-28 VITALS — BP 105/70 | HR 76 | Ht 65.0 in | Wt 147.8 lb

## 2022-02-28 DIAGNOSIS — M47812 Spondylosis without myelopathy or radiculopathy, cervical region: Secondary | ICD-10-CM | POA: Diagnosis not present

## 2022-02-28 DIAGNOSIS — G43109 Migraine with aura, not intractable, without status migrainosus: Secondary | ICD-10-CM

## 2022-02-28 MED ORDER — ONDANSETRON 4 MG PO TBDP: ORAL_TABLET | ORAL | 5 refills | Status: DC

## 2022-02-28 NOTE — Patient Instructions (Signed)
Continue Aimovig 140mg  every 28 days and topiramate 100mg  twice daily ?Take rizatriptan earliest onset of migraine.  If unable to take early, try Nurtec (may also try Nurtec first line as well)  no more than one in 24 hours. ?Ondansetron refilled ?Follow up 5 months. ?

## 2022-03-05 ENCOUNTER — Other Ambulatory Visit: Payer: Self-pay | Admitting: Internal Medicine

## 2022-03-12 ENCOUNTER — Other Ambulatory Visit: Payer: Self-pay | Admitting: Neurology

## 2022-03-19 DIAGNOSIS — M792 Neuralgia and neuritis, unspecified: Secondary | ICD-10-CM | POA: Diagnosis not present

## 2022-03-19 DIAGNOSIS — G894 Chronic pain syndrome: Secondary | ICD-10-CM | POA: Diagnosis not present

## 2022-03-19 DIAGNOSIS — M797 Fibromyalgia: Secondary | ICD-10-CM | POA: Diagnosis not present

## 2022-03-19 DIAGNOSIS — M5416 Radiculopathy, lumbar region: Secondary | ICD-10-CM | POA: Diagnosis not present

## 2022-03-24 ENCOUNTER — Other Ambulatory Visit: Payer: Self-pay | Admitting: Internal Medicine

## 2022-04-03 ENCOUNTER — Encounter: Payer: Self-pay | Admitting: Internal Medicine

## 2022-04-03 NOTE — Progress Notes (Signed)
? ? ?Subjective:  ? ? Patient ID: Teresa Campbell, female    DOB: [birth year], 51 y.o.   MRN: [id number] ? ? ?This visit occurred during the SARS-CoV-2 public health emergency.  Safety protocols were in place, including screening questions prior to the visit, additional usage of staff PPE, and extensive cleaning of exam room while observing appropriate contact time as indicated for disinfecting solutions. ? ? ? ?HPI ?Lidia is here for  ?Chief Complaint  ?Patient presents with  ? Annual Exam  ? ? ?Has toenail fungus and wants to have it treated again. She has used the topical medication in the past and it helped.  She is not sure why it came back.  Both first toe nails are discolored and thick.  One of them feels sensitive when she hits it.  She does not want it to get worse.  ? ? ? ?Medications and allergies reviewed with patient and updated if appropriate. ? ? ? ?Current Outpatient Medications on File Prior to Visit  ?Medication Sig Dispense Refill  ? AIMOVIG 140 MG/ML SOAJ ADMINISTER 1 ML UNDER THE SKIN EVERY 30 DAYS 1 mL 0  ? buPROPion (WELLBUTRIN XL) 300 MG 24 hr tablet TAKE 1 TABLET(300 MG) BY MOUTH DAILY 90 tablet 3  ? Cholecalciferol (VITAMIN D3) 25 MCG (1000 UT) CAPS Take 1 capsule (1,000 Units total) by mouth daily. 60 capsule   ? ciclopirox (PENLAC) 8 % solution APPLY AT BEDTIME. APPLY OVER NAIL AND SURROUNDING SKIN. APPLY DAILY OVER PREVIOUS CAOT. AFTER 7 DAYS, MAY REMOVE WITH ALCOHOL AND CONTINUE CYCLE 6.6 mL 3  ? citalopram (CELEXA) 40 MG tablet TAKE 1 TABLET(40 MG) BY MOUTH DAILY 90 tablet 0  ? HYDROcodone-acetaminophen (NORCO/VICODIN) 5-325 MG tablet TK 1 T PO Q 8 H PRN P FOR UP TO 30 DAYS  0  ? NUVARING 0.12-0.015 MG/24HR vaginal ring     ? omeprazole (PRILOSEC) 40 MG capsule Take 1 capsule (40 mg total) by mouth daily. 90 capsule 3  ? ondansetron (ZOFRAN-ODT) 4 MG disintegrating tablet DISSOLVE 1 TABLET(4 MG) ON THE TONGUE EVERY 8 HOURS AS NEEDED FOR NAUSEA OR VOMITING 20 tablet 5  ? rizatriptan (MAXALT)  10 MG tablet TAKE 1 TABLET BY MOUTH AT ONSET OF MIGRAINE. MAY REPEAT IN 2 HOURS AS NEEDED 10 tablet 0  ? topiramate (TOPAMAX) 100 MG tablet TAKE 1 TABLET(100 MG) BY MOUTH TWICE DAILY 60 tablet 5  ? ?No current facility-administered medications on file prior to visit.  ? ? ?Review of Systems  ?Constitutional:  Negative for fever.  ?Eyes:  Negative for visual disturbance.  ?Respiratory:  Negative for cough, shortness of breath and wheezing.   ?Cardiovascular:  Negative for chest pain, palpitations and leg swelling.  ?Gastrointestinal:  Positive for constipation and nausea (with migraines). Negative for abdominal pain, blood in stool and diarrhea.  ?     No gerd  ?Genitourinary:  Negative for dysuria.  ?Musculoskeletal:  Positive for arthralgias and back pain.  ?Skin:  Negative for rash.  ?Neurological:  Positive for headaches (migraines).  ?Psychiatric/Behavioral:  Positive for dysphoric mood and sleep disturbance. The patient is nervous/anxious.   ? ?   ?Objective:  ? ?Vitals:  ? 04/04/22 0838  ?BP: 112/80  ?Pulse: 80  ?Temp: 98 ?F (36.7 ?C)  ?SpO2: 98%  ? ?Filed Weights  ? 04/04/22 0838  ?Weight: 148 lb (67.1 kg)  ? ?Body mass index is 24.63 kg/m?. ? ?BP Readings from Last 3 Encounters:  ?04/04/22 112/80  ?02/28/22 105/70  ?  08/21/21 98/65  ? ? ?Wt Readings from Last 3 Encounters:  ?04/04/22 148 lb (67.1 kg)  ?02/28/22 147 lb 12.8 oz (67 kg)  ?08/21/21 155 lb (70.3 kg)  ? ? ? ?  04/04/2022  ?  9:33 AM 04/04/2022  ?  9:07 AM 11/03/2018  ? 10:00 AM  ?Depression screen PHQ 2/9  ?Decreased Interest 0 0 3  ?Down, Depressed, Hopeless 0 0 3  ?PHQ - 2 Score 0 0 6  ?Altered sleeping 1 1 3   ?Tired, decreased energy 1 1 3   ?Change in appetite 0 0 3  ?Feeling bad or failure about yourself  0 0 3  ?Trouble concentrating 0 0 3  ?Moving slowly or fidgety/restless 0 0 3  ?Suicidal thoughts 0 0 3  ?PHQ-9 Score 2 2 27   ?Difficult doing work/chores Not difficult at all Not difficult at all Very difficult  ? ? ? ?   ? View : No data to  display.  ?  ?  ?  ? ? ? ? ?  ?Physical Exam ?Constitutional: She appears well-developed and well-nourished. No distress.  ?HENT:  ?Head: Normocephalic and atraumatic.  ?Right Ear: External ear normal. Normal ear canal and TM ?Left Ear: External ear normal.  Normal ear canal and TM ?Mouth/Throat: Oropharynx is clear and moist.  ?Eyes: Conjunctivae and EOM are normal.  ?Neck: Neck supple. No tracheal deviation present. No thyromegaly present.  ?No carotid bruit  ?Cardiovascular: Normal rate, regular rhythm and normal heart sounds.   ?No murmur heard.  No edema. ?Pulmonary/Chest: Effort normal and breath sounds normal. No respiratory distress. She has no wheezes. She has no rales.  ?Breast: deferred   ?Abdominal: Soft. She exhibits no distension. There is no tenderness.  ?Lymphadenopathy: She has no cervical adenopathy.  ?Skin: Skin is warm and dry. She is not diaphoretic. Thickened, discolored first toenails b/l ?Psychiatric: She has a normal mood and affect. Her behavior is normal.  ? ? ? ?Lab Results  ?Component Value Date  ? WBC 10.2 03/28/2021  ? HGB 14.3 03/28/2021  ? HCT 41.6 03/28/2021  ? PLT 384.0 03/28/2021  ? GLUCOSE 78 03/28/2021  ? CHOL 186 03/28/2021  ? TRIG 134.0 03/28/2021  ? HDL 65.20 03/28/2021  ? LDLCALC 94 03/28/2021  ? ALT 17 03/28/2021  ? AST 18 03/28/2021  ? NA 138 03/28/2021  ? K 3.8 03/28/2021  ? CL 108 03/28/2021  ? CREATININE 1.24 (H) 03/28/2021  ? BUN 11 03/28/2021  ? CO2 20 03/28/2021  ? TSH 2.64 03/28/2021  ? HGBA1C 5.2 05/29/2019  ? ? ? ? ?   ?Assessment & Plan:  ? ?Physical exam: ?Screening blood work  ordered ?Exercise  none ?Weight  normal ?Substance abuse  none ? ? ?Reviewed recommended immunizations. ? ? ?Health Maintenance  ?Topic Date Due  ? COVID-19 Vaccine (1) 04/20/2022 (Originally 10/07/1971)  ? INFLUENZA VACCINE  07/10/2022  ? PAP SMEAR-Modifier  06/24/2023  ? TETANUS/TDAP  08/11/2027  ? COLONOSCOPY (Pts 45-80yrs Insurance coverage will need to be confirmed)  02/11/2028  ?  HPV VACCINES  Aged Out  ? Hepatitis C Screening  Discontinued  ? HIV Screening  Discontinued  ?  ? ? ? ? ? ? ?See Problem List for Assessment and Plan of chronic medical problems. ? ? ? ? ?

## 2022-04-03 NOTE — Patient Instructions (Addendum)
? ? ? ?Blood work was ordered.   ? ? ?Medications changes include :   lamisil 250 mg daily ? ? ?Your prescription(s) have been sent to your pharmacy.  ? ? ? ?Return in about 1 year (around 04/05/2023) for Physical Exam. ? ? ?Health Maintenance, Female ?Adopting a healthy lifestyle and getting preventive care are important in promoting health and wellness. Ask your health care provider about: ?The right schedule for you to have regular tests and exams. ?Things you can do on your own to prevent diseases and keep yourself healthy. ?What should I know about diet, weight, and exercise? ?Eat a healthy diet ? ?Eat a diet that includes plenty of vegetables, fruits, low-fat dairy products, and lean protein. ?Do not eat a lot of foods that are high in solid fats, added sugars, or sodium. ?Maintain a healthy weight ?Body mass index (BMI) is used to identify weight problems. It estimates body fat based on height and weight. Your health care provider can help determine your BMI and help you achieve or maintain a healthy weight. ?Get regular exercise ?Get regular exercise. This is one of the most important things you can do for your health. Most adults should: ?Exercise for at least 150 minutes each week. The exercise should increase your heart rate and make you sweat (moderate-intensity exercise). ?Do strengthening exercises at least twice a week. This is in addition to the moderate-intensity exercise. ?Spend less time sitting. Even light physical activity can be beneficial. ?Watch cholesterol and blood lipids ?Have your blood tested for lipids and cholesterol at 50 years of age, then have this test every 5 years. ?Have your cholesterol levels checked more often if: ?Your lipid or cholesterol levels are high. ?You are older than 50 years of age. ?You are at high risk for heart disease. ?What should I know about cancer screening? ?Depending on your health history and family history, you may need to have cancer screening at  various ages. This may include screening for: ?Breast cancer. ?Cervical cancer. ?Colorectal cancer. ?Skin cancer. ?Lung cancer. ?What should I know about heart disease, diabetes, and high blood pressure? ?Blood pressure and heart disease ?High blood pressure causes heart disease and increases the risk of stroke. This is more likely to develop in people who have high blood pressure readings or are overweight. ?Have your blood pressure checked: ?Every 3-5 years if you are 68-64 years of age. ?Every year if you are 8 years old or older. ?Diabetes ?Have regular diabetes screenings. This checks your fasting blood sugar level. Have the screening done: ?Once every three years after age 57 if you are at a normal weight and have a low risk for diabetes. ?More often and at a younger age if you are overweight or have a high risk for diabetes. ?What should I know about preventing infection? ?Hepatitis B ?If you have a higher risk for hepatitis B, you should be screened for this virus. Talk with your health care provider to find out if you are at risk for hepatitis B infection. ?Hepatitis C ?Testing is recommended for: ?Everyone born from [birth year] through 1964. ?Anyone with known risk factors for hepatitis C. ?Sexually transmitted infections (STIs) ?Get screened for STIs, including gonorrhea and chlamydia, if: ?You are sexually active and are younger than 50 years of age. ?You are older than 50 years of age and your health care provider tells you that you are at risk for this type of infection. ?Your sexual activity has changed since you were last screened,  and you are at increased risk for chlamydia or gonorrhea. Ask your health care provider if you are at risk. ?Ask your health care provider about whether you are at high risk for HIV. Your health care provider may recommend a prescription medicine to help prevent HIV infection. If you choose to take medicine to prevent HIV, you should first get tested for HIV. You should then be  tested every 3 months for as long as you are taking the medicine. ?Pregnancy ?If you are about to stop having your period (premenopausal) and you may become pregnant, seek counseling before you get pregnant. ?Take 400 to 800 micrograms (mcg) of folic acid every day if you become pregnant. ?Ask for birth control (contraception) if you want to prevent pregnancy. ?Osteoporosis and menopause ?Osteoporosis is a disease in which the bones lose minerals and strength with aging. This can result in bone fractures. If you are 40 years old or older, or if you are at risk for osteoporosis and fractures, ask your health care provider if you should: ?Be screened for bone loss. ?Take a calcium or vitamin D supplement to lower your risk of fractures. ?Be given hormone replacement therapy (HRT) to treat symptoms of menopause. ?Follow these instructions at home: ?Alcohol use ?Do not drink alcohol if: ?Your health care provider tells you not to drink. ?You are pregnant, may be pregnant, or are planning to become pregnant. ?If you drink alcohol: ?Limit how much you have to: ?0-1 drink a day. ?Know how much alcohol is in your drink. In the [place] one drink equals one 12 oz bottle of beer (355 mL), one 5 oz glass of wine (148 mL), or one 1? oz glass of hard liquor (44 mL). ?Lifestyle ?Do not use any products that contain nicotine or tobacco. These products include cigarettes, chewing tobacco, and vaping devices, such as e-cigarettes. If you need help quitting, ask your health care provider. ?Do not use street drugs. ?Do not share needles. ?Ask your health care provider for help if you need support or information about quitting drugs. ?General instructions ?Schedule regular health, dental, and eye exams. ?Stay current with your vaccines. ?Tell your health care provider if: ?You often feel depressed. ?You have ever been abused or do not feel safe at home. ?Summary ?Adopting a healthy lifestyle and getting preventive care are important in  promoting health and wellness. ?Follow your health care provider's instructions about healthy diet, exercising, and getting tested or screened for diseases. ?Follow your health care provider's instructions on monitoring your cholesterol and blood pressure. ?This information is not intended to replace advice given to you by your health care provider. Make sure you discuss any questions you have with your health care provider. ?Document Revised: 04/17/2021 Document Reviewed: 04/17/2021 ?Elsevier Patient Education ? 2023 [employer] ? ? ?

## 2022-04-04 ENCOUNTER — Encounter: Payer: Self-pay | Admitting: Internal Medicine

## 2022-04-04 ENCOUNTER — Ambulatory Visit (INDEPENDENT_AMBULATORY_CARE_PROVIDER_SITE_OTHER): Payer: BC Managed Care – PPO | Admitting: Internal Medicine

## 2022-04-04 VITALS — BP 112/80 | HR 80 | Temp 98.0°F | Ht 65.0 in | Wt 148.0 lb

## 2022-04-04 DIAGNOSIS — M797 Fibromyalgia: Secondary | ICD-10-CM

## 2022-04-04 DIAGNOSIS — N1831 Chronic kidney disease, stage 3a: Secondary | ICD-10-CM | POA: Diagnosis not present

## 2022-04-04 DIAGNOSIS — F5104 Psychophysiologic insomnia: Secondary | ICD-10-CM

## 2022-04-04 DIAGNOSIS — E785 Hyperlipidemia, unspecified: Secondary | ICD-10-CM | POA: Insufficient documentation

## 2022-04-04 DIAGNOSIS — Z0000 Encounter for general adult medical examination without abnormal findings: Secondary | ICD-10-CM | POA: Diagnosis not present

## 2022-04-04 DIAGNOSIS — Z0001 Encounter for general adult medical examination with abnormal findings: Secondary | ICD-10-CM | POA: Diagnosis not present

## 2022-04-04 DIAGNOSIS — B351 Tinea unguium: Secondary | ICD-10-CM

## 2022-04-04 DIAGNOSIS — G43709 Chronic migraine without aura, not intractable, without status migrainosus: Secondary | ICD-10-CM

## 2022-04-04 DIAGNOSIS — E7800 Pure hypercholesterolemia, unspecified: Secondary | ICD-10-CM | POA: Insufficient documentation

## 2022-04-04 DIAGNOSIS — F3289 Other specified depressive episodes: Secondary | ICD-10-CM | POA: Diagnosis not present

## 2022-04-04 LAB — CBC WITH DIFFERENTIAL/PLATELET
Basophils Absolute: 0.1 10*3/uL (ref 0.0–0.1)
Basophils Relative: 1.3 % (ref 0.0–3.0)
Eosinophils Absolute: 0.2 10*3/uL (ref 0.0–0.7)
Eosinophils Relative: 2.3 % (ref 0.0–5.0)
HCT: 41.1 % (ref 36.0–46.0)
Hemoglobin: 13.6 g/dL (ref 12.0–15.0)
Lymphocytes Relative: 36.4 % (ref 12.0–46.0)
Lymphs Abs: 3.2 10*3/uL (ref 0.7–4.0)
MCHC: 33.2 g/dL (ref 30.0–36.0)
MCV: 93.6 fl (ref 78.0–100.0)
Monocytes Absolute: 0.8 10*3/uL (ref 0.1–1.0)
Monocytes Relative: 9.1 % (ref 3.0–12.0)
Neutro Abs: 4.5 10*3/uL (ref 1.4–7.7)
Neutrophils Relative %: 50.9 % (ref 43.0–77.0)
Platelets: 369 10*3/uL (ref 150.0–400.0)
RBC: 4.39 Mil/uL (ref 3.87–5.11)
RDW: 13.1 % (ref 11.5–15.5)
WBC: 8.8 10*3/uL (ref 4.0–10.5)

## 2022-04-04 LAB — COMPREHENSIVE METABOLIC PANEL
ALT: 33 U/L (ref 0–35)
AST: 25 U/L (ref 0–37)
Albumin: 4.3 g/dL (ref 3.5–5.2)
Alkaline Phosphatase: 42 U/L (ref 39–117)
BUN: 21 mg/dL (ref 6–23)
CO2: 21 mEq/L (ref 19–32)
Calcium: 9.6 mg/dL (ref 8.4–10.5)
Chloride: 108 mEq/L (ref 96–112)
Creatinine, Ser: 1.26 mg/dL — ABNORMAL HIGH (ref 0.40–1.20)
GFR: 49.96 mL/min — ABNORMAL LOW (ref >60.00)
Glucose, Bld: 84 mg/dL (ref 70–99)
Potassium: 3.9 mEq/L (ref 3.5–5.1)
Sodium: 138 mEq/L (ref 135–145)
Total Bilirubin: 0.3 mg/dL (ref 0.2–1.2)
Total Protein: 7.1 g/dL (ref 6.0–8.3)

## 2022-04-04 LAB — LIPID PANEL
Cholesterol: 282 mg/dL — ABNORMAL HIGH (ref 0–200)
HDL: 70.1 mg/dL (ref >39.00)
LDL Cholesterol: 189 mg/dL — ABNORMAL HIGH (ref 0–99)
NonHDL: 211.42
Total CHOL/HDL Ratio: 4
Triglycerides: 110 mg/dL (ref 0.0–149.0)
VLDL: 22 mg/dL (ref 0.0–40.0)

## 2022-04-04 LAB — HEPATIC FUNCTION PANEL
ALT: 33 U/L (ref 0–35)
AST: 25 U/L (ref 0–37)
Albumin: 4.3 g/dL (ref 3.5–5.2)
Alkaline Phosphatase: 42 U/L (ref 39–117)
Bilirubin, Direct: 0 mg/dL (ref 0.0–0.3)
Total Bilirubin: 0.3 mg/dL (ref 0.2–1.2)
Total Protein: 7.1 g/dL (ref 6.0–8.3)

## 2022-04-04 LAB — TSH: TSH: 2.42 u[IU]/mL (ref 0.35–5.50)

## 2022-04-04 MED ORDER — TERBINAFINE HCL 250 MG PO TABS: 250.0000 mg | ORAL_TABLET | Freq: Every day | ORAL | 0 refills | Status: DC

## 2022-04-04 MED ORDER — TRAZODONE HCL 150 MG PO TABS: ORAL_TABLET | ORAL | 3 refills | Status: DC

## 2022-04-04 NOTE — Assessment & Plan Note (Signed)
Chronic ?Follows with pain management in [place] ?Taking hydrocodone as prescribed ?Getting ketamine treatments with pain management ?Overall controlled ?Stressed regular exercise ?

## 2022-04-04 NOTE — Assessment & Plan Note (Signed)
New ?Resolved, but recurred in b/l first toenails - nails are thick and discolored ?Discussed topical medication may help, but may not eliminate the fungus completely ?Would like to do the oral treatment -discussed risks ?Start lamixil 250 mg daily x 12 weeks ?Hepatic function today and again in one month ?

## 2022-04-04 NOTE — Assessment & Plan Note (Signed)
Chronic ?Management per neurology-Dr. Tomi Likens ?

## 2022-04-04 NOTE — Assessment & Plan Note (Signed)
Chronic ?Mild CKD ?Discussed no NSAIDs, good fluid intake ?CMP ?

## 2022-04-04 NOTE — Assessment & Plan Note (Signed)
Chronic ?Controlled, Stable ?Continue citalopram 40 mg daily, Wellbutrin 300 mg daily ?

## 2022-04-04 NOTE — Assessment & Plan Note (Signed)
Chronic ?Controlled, Stable ?Continue trazodone 150 mg nightly ?

## 2022-04-04 NOTE — Addendum Note (Signed)
Addended by: Pincus Sanes on: 04/04/2022 12:31 PM ? ? Modules accepted: Orders ? ?

## 2022-04-05 DIAGNOSIS — M7918 Myalgia, other site: Secondary | ICD-10-CM | POA: Diagnosis not present

## 2022-04-05 DIAGNOSIS — M542 Cervicalgia: Secondary | ICD-10-CM | POA: Diagnosis not present

## 2022-05-17 ENCOUNTER — Other Ambulatory Visit (INDEPENDENT_AMBULATORY_CARE_PROVIDER_SITE_OTHER): Payer: BC Managed Care – PPO

## 2022-05-17 ENCOUNTER — Other Ambulatory Visit: Payer: Self-pay | Admitting: Internal Medicine

## 2022-05-17 DIAGNOSIS — B351 Tinea unguium: Secondary | ICD-10-CM | POA: Diagnosis not present

## 2022-05-17 LAB — HEPATIC FUNCTION PANEL
ALT: 9 U/L (ref 0–35)
AST: 12 U/L (ref 0–37)
Albumin: 4.1 g/dL (ref 3.5–5.2)
Alkaline Phosphatase: 31 U/L — ABNORMAL LOW (ref 39–117)
Bilirubin, Direct: 0.1 mg/dL (ref 0.0–0.3)
Total Bilirubin: 0.3 mg/dL (ref 0.2–1.2)
Total Protein: 7 g/dL (ref 6.0–8.3)

## 2022-06-06 DIAGNOSIS — K582 Mixed irritable bowel syndrome: Secondary | ICD-10-CM | POA: Diagnosis not present

## 2022-06-06 DIAGNOSIS — K219 Gastro-esophageal reflux disease without esophagitis: Secondary | ICD-10-CM | POA: Diagnosis not present

## 2022-06-11 ENCOUNTER — Other Ambulatory Visit: Payer: Self-pay | Admitting: Internal Medicine

## 2022-06-11 ENCOUNTER — Other Ambulatory Visit: Payer: Self-pay | Admitting: Neurology

## 2022-06-18 DIAGNOSIS — M7918 Myalgia, other site: Secondary | ICD-10-CM | POA: Diagnosis not present

## 2022-06-18 DIAGNOSIS — Z79899 Other long term (current) drug therapy: Secondary | ICD-10-CM | POA: Diagnosis not present

## 2022-06-18 DIAGNOSIS — M47816 Spondylosis without myelopathy or radiculopathy, lumbar region: Secondary | ICD-10-CM | POA: Diagnosis not present

## 2022-06-18 DIAGNOSIS — M47812 Spondylosis without myelopathy or radiculopathy, cervical region: Secondary | ICD-10-CM | POA: Diagnosis not present

## 2022-06-18 DIAGNOSIS — M542 Cervicalgia: Secondary | ICD-10-CM | POA: Diagnosis not present

## 2022-06-18 DIAGNOSIS — Z5181 Encounter for therapeutic drug level monitoring: Secondary | ICD-10-CM | POA: Diagnosis not present

## 2022-07-02 ENCOUNTER — Other Ambulatory Visit: Payer: Self-pay | Admitting: Neurology

## 2022-07-06 ENCOUNTER — Other Ambulatory Visit: Payer: Self-pay | Admitting: Neurology

## 2022-07-13 DIAGNOSIS — M545 Low back pain, unspecified: Secondary | ICD-10-CM | POA: Diagnosis not present

## 2022-07-18 DIAGNOSIS — M545 Low back pain, unspecified: Secondary | ICD-10-CM | POA: Diagnosis not present

## 2022-07-18 DIAGNOSIS — R531 Weakness: Secondary | ICD-10-CM | POA: Diagnosis not present

## 2022-07-18 DIAGNOSIS — M2569 Stiffness of other specified joint, not elsewhere classified: Secondary | ICD-10-CM | POA: Diagnosis not present

## 2022-07-23 DIAGNOSIS — G43101 Migraine with aura, not intractable, with status migrainosus: Secondary | ICD-10-CM | POA: Diagnosis not present

## 2022-07-23 DIAGNOSIS — M792 Neuralgia and neuritis, unspecified: Secondary | ICD-10-CM | POA: Diagnosis not present

## 2022-07-23 DIAGNOSIS — Z5181 Encounter for therapeutic drug level monitoring: Secondary | ICD-10-CM | POA: Diagnosis not present

## 2022-07-23 DIAGNOSIS — M797 Fibromyalgia: Secondary | ICD-10-CM | POA: Diagnosis not present

## 2022-07-23 DIAGNOSIS — Z79899 Other long term (current) drug therapy: Secondary | ICD-10-CM | POA: Diagnosis not present

## 2022-07-23 DIAGNOSIS — G894 Chronic pain syndrome: Secondary | ICD-10-CM | POA: Diagnosis not present

## 2022-07-28 ENCOUNTER — Other Ambulatory Visit: Payer: Self-pay | Admitting: Neurology

## 2022-07-31 ENCOUNTER — Encounter: Payer: Self-pay | Admitting: Neurology

## 2022-08-02 DIAGNOSIS — M47816 Spondylosis without myelopathy or radiculopathy, lumbar region: Secondary | ICD-10-CM | POA: Diagnosis not present

## 2022-08-03 NOTE — Progress Notes (Unsigned)
NEUROLOGY FOLLOW UP OFFICE NOTE  Teresa Campbell [id number]  Assessment/Plan:     1.  Spell of lightheadedness and tremulousness - history of prior similar spells but never as severe as this episode and never before with tremulousness.  Suspect that it was triggered by combination of sleep deprivation, hunger (possibly hypoglycemia as glucose was 73 after drinking a cola) and having taken an extra bupropion and then complicated by onset of anxiety.  Semiology not consistent with seizure or what I think would be a primary neurologic event. 2.  Migraine with aura, without status migrainosus, not intractable - stable     1.  Migraine prevention:  Aimovig 140mg  every 28 days.  Taper off of topiramate as she would like to shorten her medication list and may not need it with Aimovig.  2.  Migraine rescue:  Rizatriptan 10mg  for severe migraines; ibuprofen with tizanidine for moderate/cervicogenic headaches.  Zofran ODT for nausea. 3.  Limit use of pain relievers to no more than 2 days out of week to prevent risk of rebound or medication-overuse headache. 4.  Keep headache diary 5.  Follow up 6 months   Subjective:  Teresa Campbell is a 51 year old right-handed woman with fibromyalgia and depression who follows up for migraines.   UPDATE: Nurtec does not work as well as rizatriptan  Intensity:  Moderate to severe Duration:  less than hour if takes rizatriptan early enough.  Otherwise several hours. Frequency:  3-4 a month on average.  She has a spell similar to her prior events but never before as severe.  She wasn't able to sleep the previous night.  She didn't eat breakfast because she had a meeting at work but drank 3 cups of coffee.  On 8/22 at around 11:30 AM, she was at work and sat down at her desk following a meeting when she suddenly developed numbness and tingling in her fingertips and toes with sensation traveling up her body.  She saw brown spots in her vision and also felt lightheaded,  like she was going to pass out, so she lowered herself onto the floor.  She became tremulous, shaking all over.  No loss of consciousness.  Her 45 brought her a cola to drink.  Her pulse was high.  Blood sugar was 74-78.  .  Tremulousness gradually resolved over an hour.  That evening she began having difficulty with dexterity and trouble typing.  She also endorsed chest tightness and anxiety.  Over the next couple of days, she didn't feel right.  She still had some chest discomfort.  During this period, she had severe insomnia and wasn't able to sleep at all.  That has since resolved.  On the morning of the event, she realized that she had accidentally took an extra bupropion.    Rescue:  For moderate:  Tizanidine, ibuprofen and ice; For severe:  Rizatriptan Current NSAIDS: Excedrin Migraine Current analgesics: hydrocodone (fibromyalgia) Current triptans: Rizatriptan 10 mg Current ergotamine:  none Current anti-emetic:  ondansetron ODT 4mg   Current muscle relaxants:  tizanidine 2mg  PRN (muscle spasms) Current anti-anxiolytic:  none Current sleep aide: Trazodone Current Antihypertensive medications:  none Current Antidepressant medications: citalopram 40 mg daily, bupropion 150 mg Current Anticonvulsant medications: Topiramate 100 mg twice daily (on higher doses she had hair thinning, weight loss) Current anti-CGRP:  none Current Vitamins/Herbal/Supplements:  B12 Current Antihistamines/Decongestants:  meclizine Other therapy:  cervical radiofrequency ablations; PT neck; ketamine infusions for fibromyalgia, cold compress, heating pad. Hormone/birth control: NuvaRing   Caffeine:  1 cup of coffee daily Diet:  Drinks water all day.  Eats healthy Exercise:  routine Depression:  yes; Anxiety:  yes Other pain:  fibromyalgia, lumbar radiculopathy Sleep hygiene:  Okay with trazodone and exercise     HISTORY:  Migraines: Onset:  Childhood.  Missed school as a child.  Worse after  starting her menstrual cycle. Location:  Bi-frontal Quality:  Pounding Initial Intensity:  Severe.  She denies new headache, thunderclap headache or severe headache that wakes her from sleep. Aura:  Flashing lights/blue colors Prodrome:  none Postdrome:  fatigue Associated symptoms:  Photophobia, phonophobia, nausea, osmophobia, sometimes vomiting.  She denies associated unilateral numbness or weakness. Initial Duration:  2 days with Maxalt (1 hour if taken at onset) Initial Frequency:  2 migraines past 30 days (15-17 a month prior to topiramate) Initial Frequency of abortive medication: 2 days past month Triggers: increased depression Relieving factors:  Ice pack on head and heat to back of neck Activity:  aggravates   She has neck pain and mild to moderate right sided jaw pain radiating to the temple.  She was evaluated for TMJ   Past NSAIDS:  Ibuprofen (kidney problems), naproxen, Toradol shot Past analgesics:  Tylenol, Excedrin Past abortive triptans: Zomig, sumatriptan tablet Past abortive ergotamine:  none Past muscle relaxants:  none Past anti-emetic:  none Past antihypertensive medications:  none Past antidepressant medications:  Amitriptyline 25mg  Past anticonvulsant medications:  gabapentin Past anti-CGRP:  Nurtec Past vitamins/Herbal/Supplements:  none Past antihistamines/decongestants:  none Other past therapies:  Botox (effective.  However, she was having trouble getting the Botox due to cost), Gamacor vagus nerve stimulator (abortive treatment, effective), cervical nerve ablations (helpful).   Family history of headache:  Father, paternal grandfather, 2 aunts   Transient Spells: In June 2020, she was in the grocery store when she started feeling dizzy.  She couldn't hear what the lady at check out was saying.  Everything was cloudy and her legs felt they were going to buckle.  She felt hot and diaphoretic.  She sat in her car with the air conditioner blowing, drank a  water and ate a honey bun.  She felt better but was fatigued for the rest of the day.  She reports similar events in the past but not as severe since childhood.  She did have another episode the next day as well, less severe.  She drank orange juice and peanut butter crackers and it resolved.  No associated headaches.  She has had other events as well.  When she was around 41, she was at work and she became confused.  She couldn't understand her coworkers and she was lethargic.  She was unable to communicate with them.  She went to the ED where testing was unremarkable.  She had a similar event about a year ago.  She is unsure if associated with headaches because she suffers from so many headaches.  Awake and asleep EEG from 07/01/2019 was normal.  MRI of brain with and without contrast from 07/10/2019 was normal  PAST MEDICAL HISTORY: Past Medical History:  Diagnosis Date   Colon polyps    Depression    Fibromyalgia    GERD (gastroesophageal reflux disease)    Hemorrhoids    Insomnia    Migraine     MEDICATIONS: Current Outpatient Medications on File Prior to Visit  Medication Sig Dispense Refill   AIMOVIG 140 MG/ML SOAJ ADMINISTER 1 ML UNDER THE SKIN EVERY 30 DAYS 1 mL 0   buPROPion [facility]  XL) 300 MG 24 hr tablet TAKE 1 TABLET(300 MG) BY MOUTH DAILY 90 tablet 3   Cholecalciferol (VITAMIN D3) 25 MCG (1000 UT) CAPS Take 1 capsule (1,000 Units total) by mouth daily. 60 capsule    ciclopirox (PENLAC) 8 % solution APPLY AT BEDTIME. APPLY OVER NAIL AND SURROUNDING SKIN. APPLY DAILY OVER PREVIOUS CAOT. AFTER 7 DAYS, MAY REMOVE WITH ALCOHOL AND CONTINUE CYCLE 6.6 mL 3   citalopram (CELEXA) 40 MG tablet TAKE 1 TABLET(40 MG) BY MOUTH DAILY 90 tablet 0   HYDROcodone-acetaminophen (NORCO/VICODIN) 5-325 MG tablet TK 1 T PO Q 8 H PRN P FOR UP TO 30 DAYS  0   NUVARING 0.12-0.015 MG/24HR vaginal ring      omeprazole (PRILOSEC) 40 MG capsule Take 1 capsule (40 mg total) by mouth daily. 90 capsule 3    ondansetron (ZOFRAN-ODT) 4 MG disintegrating tablet DISSOLVE 1 TABLET(4 MG) ON THE TONGUE EVERY 8 HOURS AS NEEDED FOR NAUSEA OR VOMITING 20 tablet 5   rizatriptan (MAXALT) 10 MG tablet TAKE 1 TABLET BY MOUTH AT ONSET OF MIGRAINE. MAY REPEAT IN 2 HOURS AS NEEDED 10 tablet 0   terbinafine (LAMISIL) 250 MG tablet TAKE 1 TABLET(250 MG) BY MOUTH DAILY 90 tablet 0   topiramate (TOPAMAX) 100 MG tablet TAKE 1 TABLET(100 MG) BY MOUTH TWICE DAILY 60 tablet 5   traZODone (DESYREL) 150 MG tablet TAKE 1 TABLET BY MOUTH EVERY NIGHT AS DIRECTED. MAY INCREASE TO 2 TABLETS EVERY NIGHT AS DIRECTED 90 tablet 3   No current facility-administered medications on file prior to visit.    ALLERGIES: Allergies  Allergen Reactions   Cymbalta [Duloxetine Hcl]     Ineffective   Lyrica [Pregabalin] Other (See Comments)    Ineffective   Tramadol     Ineffective     FAMILY HISTORY: Family History  Problem Relation Age of Onset   Arthritis Mother    COPD Mother    Depression Mother    Hyperlipidemia Mother    Miscarriages / [place] Mother    Arthritis Father    Depression Father    Heart disease Father    Hyperlipidemia Father    Alcohol abuse Sister    Depression Sister    Drug abuse Sister    Hyperlipidemia Sister    Hypertension Sister    Alcohol abuse Brother    Depression Brother    Drug abuse Brother    Depression Daughter    Alcohol abuse Maternal Grandmother    Depression Maternal Grandmother    Stroke Maternal Grandfather    Arthritis Paternal Grandmother    Diabetes Paternal Grandmother    Stroke Paternal Grandfather    Alcohol abuse Sister    Depression Sister    Drug abuse Sister    Depression Brother       Objective:  Blood pressure 127/79, pulse 81, height 5\' 5"  (1.651 m), weight 154 lb (69.9 kg), SpO2 98 %. General: No acute distress.  Patient appears well-groomed.   Head:  Normocephalic/atraumatic Eyes:  Fundi examined but not visualized Neck: supple, no paraspinal  tenderness, full range of motion Heart:  Regular rate and rhythm Neurological Exam: alert and oriented to person, place, and time.  Speech fluent and not dysarthric, language intact.  CN II-XII intact. Bulk and tone normal, muscle strength 5/5 throughout.  Sensation to light touch intact.  Deep tendon reflexes 2+ throughout, toes downgoing.  Finger to nose testing intact.  Gait normal, Romberg negative.   , DO  CC: Shon Millet  Lawerance Bach, MD

## 2022-08-06 ENCOUNTER — Ambulatory Visit (INDEPENDENT_AMBULATORY_CARE_PROVIDER_SITE_OTHER): Payer: BC Managed Care – PPO | Admitting: Neurology

## 2022-08-06 ENCOUNTER — Encounter: Payer: Self-pay | Admitting: Neurology

## 2022-08-06 VITALS — BP 127/79 | HR 81 | Ht 65.0 in | Wt 154.0 lb

## 2022-08-06 DIAGNOSIS — R251 Tremor, unspecified: Secondary | ICD-10-CM | POA: Diagnosis not present

## 2022-08-06 DIAGNOSIS — G43109 Migraine with aura, not intractable, without status migrainosus: Secondary | ICD-10-CM | POA: Diagnosis not present

## 2022-08-06 NOTE — Patient Instructions (Signed)
Taper off topiramate:   Take 1/2 tablet twice daily for one week Then 1/2 tablet at bedtime for one week Then stop. Continue Aimovig 140mg  every 28 days Rizatriptan as needed. Limit use of pain relievers to no more than 2 days out of week to prevent risk of rebound or medication-overuse headache. Keep headache diary Follow up 4 months.

## 2022-08-12 ENCOUNTER — Other Ambulatory Visit: Payer: Self-pay | Admitting: Neurology

## 2022-08-16 ENCOUNTER — Ambulatory Visit: Payer: BC Managed Care – PPO | Admitting: Neurology

## 2022-08-23 DIAGNOSIS — M542 Cervicalgia: Secondary | ICD-10-CM | POA: Diagnosis not present

## 2022-08-23 DIAGNOSIS — M797 Fibromyalgia: Secondary | ICD-10-CM | POA: Diagnosis not present

## 2022-08-23 DIAGNOSIS — G894 Chronic pain syndrome: Secondary | ICD-10-CM | POA: Diagnosis not present

## 2022-08-23 DIAGNOSIS — G43101 Migraine with aura, not intractable, with status migrainosus: Secondary | ICD-10-CM | POA: Diagnosis not present

## 2022-08-27 ENCOUNTER — Other Ambulatory Visit: Payer: Self-pay | Admitting: Neurology

## 2022-08-27 ENCOUNTER — Other Ambulatory Visit: Payer: Self-pay | Admitting: Internal Medicine

## 2022-09-06 ENCOUNTER — Encounter: Payer: Self-pay | Admitting: Neurology

## 2022-09-07 ENCOUNTER — Other Ambulatory Visit: Payer: Self-pay | Admitting: Neurology

## 2022-09-07 MED ORDER — TIZANIDINE HCL 4 MG PO TABS: 4.0000 mg | ORAL_TABLET | Freq: Four times a day (QID) | ORAL | 5 refills | Status: DC | PRN

## 2022-09-08 ENCOUNTER — Other Ambulatory Visit: Payer: Self-pay | Admitting: Neurology

## 2022-09-10 DIAGNOSIS — M47816 Spondylosis without myelopathy or radiculopathy, lumbar region: Secondary | ICD-10-CM | POA: Diagnosis not present

## 2022-09-14 DIAGNOSIS — R531 Weakness: Secondary | ICD-10-CM | POA: Diagnosis not present

## 2022-09-14 DIAGNOSIS — M545 Low back pain, unspecified: Secondary | ICD-10-CM | POA: Diagnosis not present

## 2022-09-14 DIAGNOSIS — M2569 Stiffness of other specified joint, not elsewhere classified: Secondary | ICD-10-CM | POA: Diagnosis not present

## 2022-09-18 DIAGNOSIS — M2569 Stiffness of other specified joint, not elsewhere classified: Secondary | ICD-10-CM | POA: Diagnosis not present

## 2022-09-18 DIAGNOSIS — M545 Low back pain, unspecified: Secondary | ICD-10-CM | POA: Diagnosis not present

## 2022-09-18 DIAGNOSIS — R531 Weakness: Secondary | ICD-10-CM | POA: Diagnosis not present

## 2022-09-26 DIAGNOSIS — M47812 Spondylosis without myelopathy or radiculopathy, cervical region: Secondary | ICD-10-CM | POA: Diagnosis not present

## 2022-10-08 DIAGNOSIS — M47816 Spondylosis without myelopathy or radiculopathy, lumbar region: Secondary | ICD-10-CM | POA: Diagnosis not present

## 2022-10-10 ENCOUNTER — Other Ambulatory Visit (HOSPITAL_COMMUNITY): Payer: Self-pay

## 2022-10-19 ENCOUNTER — Encounter: Payer: Self-pay | Admitting: Internal Medicine

## 2022-10-19 ENCOUNTER — Encounter: Payer: Self-pay | Admitting: Neurology

## 2022-10-22 NOTE — Progress Notes (Unsigned)
    Subjective:    Patient ID: Teresa Campbell, female    DOB: Feb 11, 1971, 51 y.o.   MRN: [id number]      HPI Teresa Campbell is here for No chief complaint on file.   Panic attacks -   Taking celexa 40 mg daily, bupropion xl 300 mg daily   Medications and allergies reviewed with patient and updated if appropriate.  Current Outpatient Medications on File Prior to Visit  Medication Sig Dispense Refill   tiZANidine (ZANAFLEX) 4 MG tablet Take 1 tablet (4 mg total) by mouth every 6 (six) hours as needed for muscle spasms. 30 tablet 5   AIMOVIG 140 MG/ML SOAJ ADMINISTER 1 ML UNDER THE SKIN EVERY 30 DAYS 1 mL 3   buPROPion (WELLBUTRIN XL) 300 MG 24 hr tablet TAKE 1 TABLET(300 MG) BY MOUTH DAILY 90 tablet 3   Cholecalciferol (VITAMIN D3) 25 MCG (1000 UT) CAPS Take 1 capsule (1,000 Units total) by mouth daily. 60 capsule    citalopram (CELEXA) 40 MG tablet TAKE 1 TABLET(40 MG) BY MOUTH DAILY 90 tablet 0   HYDROcodone-acetaminophen (NORCO/VICODIN) 5-325 MG tablet TK 1 T PO Q 8 H PRN P FOR UP TO 30 DAYS  0   NUVARING 0.12-0.015 MG/24HR vaginal ring      omeprazole (PRILOSEC) 40 MG capsule Take 1 capsule (40 mg total) by mouth daily. 90 capsule 3   ondansetron (ZOFRAN-ODT) 4 MG disintegrating tablet DISSOLVE 1 TABLET(4 MG) ON THE TONGUE EVERY 8 HOURS AS NEEDED FOR NAUSEA OR VOMITING 20 tablet 5   rizatriptan (MAXALT) 10 MG tablet TAKE 1 TABLET BY MOUTH AT ONSET OF MIGRAINE. MAY REPEAT IN 2 HOURS AS NEEDED 10 tablet 0   traZODone (DESYREL) 150 MG tablet TAKE 1 TABLET BY MOUTH EVERY NIGHT AS DIRECTED. MAY INCREASE TO 2 TABLETS EVERY NIGHT AS DIRECTED 90 tablet 3   [DISCONTINUED] topiramate (TOPAMAX) 100 MG tablet TAKE 1 TABLET BY MOUTH TWICE DAILY 180 tablet 0   No current facility-administered medications on file prior to visit.    Review of Systems     Objective:  There were no vitals filed for this visit. BP Readings from Last 3 Encounters:  08/06/22 127/79  04/04/22 112/80  02/28/22 105/70    Wt Readings from Last 3 Encounters:  08/06/22 154 lb (69.9 kg)  04/04/22 148 lb (67.1 kg)  02/28/22 147 lb 12.8 oz (67 kg)   There is no height or weight on file to calculate BMI.    Physical Exam         Assessment & Plan:    See Problem List for Assessment and Plan of chronic medical problems.

## 2022-10-23 ENCOUNTER — Encounter: Payer: Self-pay | Admitting: Internal Medicine

## 2022-10-23 ENCOUNTER — Ambulatory Visit (INDEPENDENT_AMBULATORY_CARE_PROVIDER_SITE_OTHER): Payer: BC Managed Care – PPO | Admitting: Internal Medicine

## 2022-10-23 VITALS — BP 114/80 | HR 94 | Temp 98.3°F | Ht 65.0 in | Wt 161.0 lb

## 2022-10-23 DIAGNOSIS — E7849 Other hyperlipidemia: Secondary | ICD-10-CM | POA: Diagnosis not present

## 2022-10-23 DIAGNOSIS — N951 Menopausal and female climacteric states: Secondary | ICD-10-CM | POA: Diagnosis not present

## 2022-10-23 DIAGNOSIS — F419 Anxiety disorder, unspecified: Secondary | ICD-10-CM

## 2022-10-23 DIAGNOSIS — F5104 Psychophysiologic insomnia: Secondary | ICD-10-CM | POA: Diagnosis not present

## 2022-10-23 DIAGNOSIS — Z23 Encounter for immunization: Secondary | ICD-10-CM | POA: Diagnosis not present

## 2022-10-23 DIAGNOSIS — N1831 Chronic kidney disease, stage 3a: Secondary | ICD-10-CM | POA: Diagnosis not present

## 2022-10-23 DIAGNOSIS — F32A Depression, unspecified: Secondary | ICD-10-CM

## 2022-10-23 DIAGNOSIS — R42 Dizziness and giddiness: Secondary | ICD-10-CM

## 2022-10-23 NOTE — Assessment & Plan Note (Signed)
Has had increased anxiety recently-likely combination of symptoms that she is experiencing and hormonal shifts/menopause Overall anxiety and depression have been controlled before all of this started, but we did discuss possibly changing citalopram to see if that would help-he would like to avoid that and I agree since things have been pretty well controlled up until recently Continue citalopram 40 mg daily and bupropion XL 300 mg daily

## 2022-10-23 NOTE — Patient Instructions (Addendum)
    Flu immunization administered today.      Have blood work to recheck your cholesterol    Medications changes include :   none     Return for April for your physical .

## 2022-10-23 NOTE — Assessment & Plan Note (Signed)
Acute Having episodes of dizziness, nausea and head pain that seems to be coming from her neck going up to her head, which sounds more like nerve pain Given chronic neck issues, recent nerve ablation having the symptoms are likely related to her chronic neck issues-we will discuss with her pain management/neck specialist and keep me updated Can evaluate further if he does not feel this is related to the neck issues

## 2022-10-23 NOTE — Assessment & Plan Note (Signed)
Chronic Cholesterol was high earlier this year and the year before it was much better She has improved her diet Will check lipid panel, CMP

## 2022-10-23 NOTE — Assessment & Plan Note (Signed)
Chronic We will check CMP

## 2022-10-23 NOTE — Assessment & Plan Note (Signed)
Chronic Somewhat controlled-having hot flashes at night and that is waking her up Continue trazodone 150 mg at night

## 2022-10-23 NOTE — Assessment & Plan Note (Signed)
New She was having occasional hot flashes but recently started having increased and more severe hot flashes Has not had menstrual cycle in a while so not able to tell if she is in menopause or not but it sounds like she is This may also be accounting for some of the increased anxiety Discussed some treatments-she would like to avoid medication Can follow-up with her gynecologist Encouraged healthy lifestyle and other things that may help with some of the menopausal symptoms

## 2022-10-25 ENCOUNTER — Other Ambulatory Visit (INDEPENDENT_AMBULATORY_CARE_PROVIDER_SITE_OTHER): Payer: BC Managed Care – PPO

## 2022-10-25 DIAGNOSIS — N1831 Chronic kidney disease, stage 3a: Secondary | ICD-10-CM

## 2022-10-25 DIAGNOSIS — E7849 Other hyperlipidemia: Secondary | ICD-10-CM | POA: Diagnosis not present

## 2022-10-25 LAB — COMPREHENSIVE METABOLIC PANEL
ALT: 10 U/L (ref 0–35)
AST: 14 U/L (ref 0–37)
Albumin: 3.9 g/dL (ref 3.5–5.2)
Alkaline Phosphatase: 31 U/L — ABNORMAL LOW (ref 39–117)
BUN: 14 mg/dL (ref 6–23)
CO2: 25 mEq/L (ref 19–32)
Calcium: 9.3 mg/dL (ref 8.4–10.5)
Chloride: 103 mEq/L (ref 96–112)
Creatinine, Ser: 1.06 mg/dL (ref 0.40–1.20)
GFR: 61.24 mL/min (ref >60.00)
Glucose, Bld: 92 mg/dL (ref 70–99)
Potassium: 4.4 mEq/L (ref 3.5–5.1)
Sodium: 136 mEq/L (ref 135–145)
Total Bilirubin: 0.3 mg/dL (ref 0.2–1.2)
Total Protein: 6.8 g/dL (ref 6.0–8.3)

## 2022-10-25 LAB — LIPID PANEL
Cholesterol: 211 mg/dL — ABNORMAL HIGH (ref 0–200)
HDL: 65.7 mg/dL (ref >39.00)
LDL Cholesterol: 107 mg/dL — ABNORMAL HIGH (ref 0–99)
NonHDL: 144.98
Total CHOL/HDL Ratio: 3
Triglycerides: 192 mg/dL — ABNORMAL HIGH (ref 0.0–149.0)
VLDL: 38.4 mg/dL (ref 0.0–40.0)

## 2022-11-12 DIAGNOSIS — Z79899 Other long term (current) drug therapy: Secondary | ICD-10-CM | POA: Diagnosis not present

## 2022-11-12 DIAGNOSIS — M797 Fibromyalgia: Secondary | ICD-10-CM | POA: Diagnosis not present

## 2022-11-12 DIAGNOSIS — G894 Chronic pain syndrome: Secondary | ICD-10-CM | POA: Diagnosis not present

## 2022-11-12 DIAGNOSIS — Z5181 Encounter for therapeutic drug level monitoring: Secondary | ICD-10-CM | POA: Diagnosis not present

## 2022-11-12 DIAGNOSIS — M792 Neuralgia and neuritis, unspecified: Secondary | ICD-10-CM | POA: Diagnosis not present

## 2022-11-12 DIAGNOSIS — M47812 Spondylosis without myelopathy or radiculopathy, cervical region: Secondary | ICD-10-CM | POA: Diagnosis not present

## 2022-11-13 ENCOUNTER — Other Ambulatory Visit: Payer: Self-pay | Admitting: Neurology

## 2022-11-22 ENCOUNTER — Encounter: Payer: Self-pay | Admitting: Internal Medicine

## 2022-11-22 NOTE — Progress Notes (Signed)
Subjective:    Patient ID: Teresa Campbell, female    DOB: 07/27/1971, 51 y.o.   MRN: [id number]      HPI Teresa Campbell is here for  Chief Complaint  Patient presents with   Generalized Body Aches    Body aches and low grade fever (Started Saturday); Got better then she started feeling worse yesterday.     Body aches, low grade fever -    Saturday at 4:30 started - 6 days ago.  Symptoms have waxed and waned.  She has had good days and bad days since then.  The past couple of days has been worse.  She states fatigue, low-grade fever, nasal congestion, sore throat, mild chest tightness, primarily dry cough, Mild shortness of breath, nausea, myalgias, sinus headaches in the morning and lightheadedness.  Taking dayquil, nyquil, tylenol.   Covid, rsv test neg   Medications and allergies reviewed with patient and updated if appropriate.  Current Outpatient Medications on File Prior to Visit  Medication Sig Dispense Refill   AIMOVIG 140 MG/ML SOAJ ADMINISTER 1 ML UNDER THE SKIN EVERY 30 DAYS 1 mL 3   buPROPion (WELLBUTRIN XL) 300 MG 24 hr tablet TAKE 1 TABLET(300 MG) BY MOUTH DAILY 90 tablet 3   Cholecalciferol (VITAMIN D3) 25 MCG (1000 UT) CAPS Take 1 capsule (1,000 Units total) by mouth daily. 60 capsule    citalopram (CELEXA) 40 MG tablet TAKE 1 TABLET(40 MG) BY MOUTH DAILY 90 tablet 0   HYDROcodone-acetaminophen (NORCO/VICODIN) 5-325 MG tablet TK 1 T PO Q 8 H PRN P FOR UP TO 30 DAYS  0   NUVARING 0.12-0.015 MG/24HR vaginal ring      omeprazole (PRILOSEC) 40 MG capsule Take 1 capsule (40 mg total) by mouth daily. 90 capsule 3   ondansetron (ZOFRAN-ODT) 4 MG disintegrating tablet DISSOLVE 1 TABLET(4 MG) ON THE TONGUE EVERY 8 HOURS AS NEEDED FOR NAUSEA OR VOMITING 20 tablet 5   rizatriptan (MAXALT) 10 MG tablet TAKE 1 TABLET BY MOUTH AT ONSET OF MIGRAINE. MAY REPEAT IN 2 HOURS AS NEEDED 10 tablet 0   tiZANidine (ZANAFLEX) 4 MG tablet Take 1 tablet (4 mg total) by mouth every 6 (six)  hours as needed for muscle spasms. 30 tablet 5   traZODone (DESYREL) 150 MG tablet TAKE 1 TABLET BY MOUTH EVERY NIGHT AS DIRECTED. MAY INCREASE TO 2 TABLETS EVERY NIGHT AS DIRECTED 90 tablet 3   No current facility-administered medications on file prior to visit.    Review of Systems  Constitutional:  Positive for fatigue and fever (low grade fever).  HENT:  Positive for congestion and sore throat. Negative for ear pain, postnasal drip, sinus pressure and sinus pain.   Respiratory:  Positive for cough (mild, dry), chest tightness (mild) and shortness of breath (mild). Negative for wheezing.   Gastrointestinal:  Positive for nausea.  Musculoskeletal:  Positive for myalgias.  Neurological:  Positive for light-headedness (mild) and headaches (sinus headaches in morning). Negative for dizziness.       Objective:   Vitals:   11/23/22 1330  BP: 118/72  Pulse: 83  Temp: 98.7 F (37.1 C)  SpO2: 97%   BP Readings from Last 3 Encounters:  11/23/22 118/72  10/23/22 114/80  08/06/22 127/79   Wt Readings from Last 3 Encounters:  11/23/22 164 lb (74.4 kg)  10/23/22 161 lb (73 kg)  08/06/22 154 lb (69.9 kg)   Body mass index is 27.29 kg/m.    Physical Exam Constitutional:  General: She is not in acute distress.    Appearance: Normal appearance.  HENT:     Head: Normocephalic and atraumatic.  Eyes:     Conjunctiva/sclera: Conjunctivae normal.  Cardiovascular:     Rate and Rhythm: Normal rate and regular rhythm.     Heart sounds: Normal heart sounds. No murmur heard. Pulmonary:     Effort: Pulmonary effort is normal. No respiratory distress.     Breath sounds: Normal breath sounds. No wheezing.  Musculoskeletal:     Cervical back: Neck supple.     Right lower leg: No edema.     Left lower leg: No edema.  Lymphadenopathy:     Cervical: No cervical adenopathy.  Skin:    General: Skin is warm and dry.     Findings: No rash.  Neurological:     Mental Status: She is  alert. Mental status is at baseline.  Psychiatric:        Mood and Affect: Mood normal.        Behavior: Behavior normal.            Assessment & Plan:    See Problem List for Assessment and Plan of chronic medical problems.

## 2022-11-23 ENCOUNTER — Ambulatory Visit (INDEPENDENT_AMBULATORY_CARE_PROVIDER_SITE_OTHER): Payer: BC Managed Care – PPO

## 2022-11-23 ENCOUNTER — Ambulatory Visit (INDEPENDENT_AMBULATORY_CARE_PROVIDER_SITE_OTHER): Payer: BC Managed Care – PPO | Admitting: Internal Medicine

## 2022-11-23 VITALS — BP 118/72 | HR 83 | Temp 98.7°F | Ht 65.0 in | Wt 164.0 lb

## 2022-11-23 DIAGNOSIS — J069 Acute upper respiratory infection, unspecified: Secondary | ICD-10-CM

## 2022-11-23 LAB — POC COVID19 BINAXNOW: SARS Coronavirus 2 Ag: NEGATIVE

## 2022-11-23 LAB — POCT RESPIRATORY SYNCYTIAL VIRUS: RSV Rapid Ag: NEGATIVE

## 2022-11-23 MED ORDER — ALBUTEROL SULFATE HFA 108 (90 BASE) MCG/ACT IN AERS: 2.0000 | INHALATION_SPRAY | Freq: Four times a day (QID) | RESPIRATORY_TRACT | 0 refills | Status: DC | PRN

## 2022-11-23 MED ORDER — HYDROCOD POLI-CHLORPHE POLI ER 10-8 MG/5ML PO SUER: 5.0000 mL | Freq: Two times a day (BID) | ORAL | 0 refills | Status: DC | PRN

## 2022-11-23 NOTE — Patient Instructions (Addendum)
      Have a chest xray downstairs    Your tests for covid and rsv were negative.    Medications changes include :   tussionex cough syrup, albuterol inhaler     Return if symptoms worsen or fail to improve.

## 2022-11-23 NOTE — Assessment & Plan Note (Signed)
Acute Symptoms likely viral in nature COVID and RSV tests negative Continue symptomatic treatment with over-the-counter cold medications, Tussionex cough syrup prescribed Albuterol inhaler prescribed-use every 4-6 hours for chest tightness, wheezing, cough Tylenol/ibuprofen Increase rest and fluids Call if symptoms worsen or do not improve

## 2022-11-26 DIAGNOSIS — M533 Sacrococcygeal disorders, not elsewhere classified: Secondary | ICD-10-CM | POA: Diagnosis not present

## 2022-12-09 ENCOUNTER — Other Ambulatory Visit: Payer: Self-pay | Admitting: Neurology

## 2022-12-11 ENCOUNTER — Telehealth: Payer: Self-pay | Admitting: Neurology

## 2022-12-11 NOTE — Telephone Encounter (Signed)
Called pt back and ask her about the amount of Zanaflex she had and if she needed more. She has a good amount and does not need any more.

## 2022-12-11 NOTE — Telephone Encounter (Signed)
Pt called in stating she was returning a call from [place]. She thinks it might be for her tizanidine. She is now on the 4 mg.

## 2022-12-15 ENCOUNTER — Other Ambulatory Visit: Payer: Self-pay | Admitting: Internal Medicine

## 2022-12-19 ENCOUNTER — Ambulatory Visit: Payer: BC Managed Care – PPO | Admitting: Neurology

## 2022-12-20 DIAGNOSIS — M533 Sacrococcygeal disorders, not elsewhere classified: Secondary | ICD-10-CM | POA: Diagnosis not present

## 2022-12-20 DIAGNOSIS — M5481 Occipital neuralgia: Secondary | ICD-10-CM | POA: Diagnosis not present

## 2022-12-20 DIAGNOSIS — G894 Chronic pain syndrome: Secondary | ICD-10-CM | POA: Diagnosis not present

## 2022-12-20 DIAGNOSIS — M797 Fibromyalgia: Secondary | ICD-10-CM | POA: Diagnosis not present

## 2023-01-01 ENCOUNTER — Other Ambulatory Visit: Payer: Self-pay | Admitting: Neurology

## 2023-01-08 ENCOUNTER — Other Ambulatory Visit: Payer: Self-pay | Admitting: Neurology

## 2023-01-08 ENCOUNTER — Encounter: Payer: Self-pay | Admitting: Neurology

## 2023-01-08 ENCOUNTER — Telehealth: Payer: Self-pay

## 2023-01-08 MED ORDER — TOPIRAMATE 50 MG PO TABS: ORAL_TABLET | ORAL | 0 refills | Status: DC

## 2023-01-08 NOTE — Telephone Encounter (Signed)
Per Mychart message, I would like to restart Topiramate. I have tried to be without it for a while now and I am having a lot of constant headaches. not migraines but constant headaches. I have requested a refill at the pharmacy today.   Please send prescription for topiramate 50mg  tablet:  take 1/2 tablet twice daily for one week, then 1 tablet twice daily for one week, then 1 tablet in morning and 2 tablets at bedtime for one week, then 2 tablets twice daily

## 2023-01-14 DIAGNOSIS — Z79899 Other long term (current) drug therapy: Secondary | ICD-10-CM | POA: Diagnosis not present

## 2023-01-14 DIAGNOSIS — M792 Neuralgia and neuritis, unspecified: Secondary | ICD-10-CM | POA: Diagnosis not present

## 2023-01-14 DIAGNOSIS — M797 Fibromyalgia: Secondary | ICD-10-CM | POA: Diagnosis not present

## 2023-01-14 DIAGNOSIS — Z5181 Encounter for therapeutic drug level monitoring: Secondary | ICD-10-CM | POA: Diagnosis not present

## 2023-01-14 DIAGNOSIS — G894 Chronic pain syndrome: Secondary | ICD-10-CM | POA: Diagnosis not present

## 2023-01-30 DIAGNOSIS — N898 Other specified noninflammatory disorders of vagina: Secondary | ICD-10-CM | POA: Diagnosis not present

## 2023-01-30 DIAGNOSIS — Z01419 Encounter for gynecological examination (general) (routine) without abnormal findings: Secondary | ICD-10-CM | POA: Diagnosis not present

## 2023-01-30 DIAGNOSIS — Z6826 Body mass index (BMI) 26.0-26.9, adult: Secondary | ICD-10-CM | POA: Diagnosis not present

## 2023-01-30 DIAGNOSIS — Z3049 Encounter for surveillance of other contraceptives: Secondary | ICD-10-CM | POA: Diagnosis not present

## 2023-01-30 DIAGNOSIS — B3731 Acute candidiasis of vulva and vagina: Secondary | ICD-10-CM | POA: Diagnosis not present

## 2023-02-26 DIAGNOSIS — G894 Chronic pain syndrome: Secondary | ICD-10-CM | POA: Diagnosis not present

## 2023-02-26 DIAGNOSIS — M797 Fibromyalgia: Secondary | ICD-10-CM | POA: Diagnosis not present

## 2023-02-26 DIAGNOSIS — M792 Neuralgia and neuritis, unspecified: Secondary | ICD-10-CM | POA: Diagnosis not present

## 2023-02-26 DIAGNOSIS — M5481 Occipital neuralgia: Secondary | ICD-10-CM | POA: Diagnosis not present

## 2023-02-28 ENCOUNTER — Ambulatory Visit: Payer: BC Managed Care – PPO | Admitting: Neurology

## 2023-03-13 ENCOUNTER — Other Ambulatory Visit: Payer: Self-pay | Admitting: Neurology

## 2023-03-30 ENCOUNTER — Other Ambulatory Visit: Payer: Self-pay | Admitting: Internal Medicine

## 2023-04-01 ENCOUNTER — Other Ambulatory Visit: Payer: Self-pay | Admitting: Neurology

## 2023-04-01 ENCOUNTER — Telehealth: Payer: Self-pay

## 2023-04-01 NOTE — Telephone Encounter (Signed)
Fax received from [place], [place] needed for Rizatriptan. No alternatives given.

## 2023-04-03 DIAGNOSIS — M47812 Spondylosis without myelopathy or radiculopathy, cervical region: Secondary | ICD-10-CM | POA: Diagnosis not present

## 2023-04-03 DIAGNOSIS — M47816 Spondylosis without myelopathy or radiculopathy, lumbar region: Secondary | ICD-10-CM | POA: Diagnosis not present

## 2023-04-03 DIAGNOSIS — G894 Chronic pain syndrome: Secondary | ICD-10-CM | POA: Diagnosis not present

## 2023-04-09 ENCOUNTER — Other Ambulatory Visit: Payer: Self-pay | Admitting: Internal Medicine

## 2023-04-10 ENCOUNTER — Encounter: Payer: Self-pay | Admitting: Internal Medicine

## 2023-04-10 ENCOUNTER — Telehealth: Payer: Self-pay

## 2023-04-10 ENCOUNTER — Other Ambulatory Visit (HOSPITAL_COMMUNITY): Payer: Self-pay

## 2023-04-10 NOTE — Progress Notes (Signed)
Subjective:    Patient ID: Teresa Campbell, female    DOB: 07/15/1971, 52 y.o.   MRN: [id number]      HPI Teresa Campbell is here for a Physical exam and her chronic medical problems.      Medications and allergies reviewed with patient and updated if appropriate.  Current Outpatient Medications on File Prior to Visit  Medication Sig Dispense Refill  . AIMOVIG 140 MG/ML SOAJ ADMINISTER 1 ML UNDER THE SKIN EVERY 30 DAYS 1 mL 1  . albuterol (VENTOLIN HFA) 108 (90 Base) MCG/ACT inhaler Inhale 2 puffs into the lungs every 6 (six) hours as needed for wheezing or shortness of breath. 8 g 0  . buPROPion (WELLBUTRIN XL) 300 MG 24 hr tablet TAKE 1 TABLET(300 MG) BY MOUTH DAILY 90 tablet 3  . Cholecalciferol (VITAMIN D3) 25 MCG (1000 UT) CAPS Take 1 capsule (1,000 Units total) by mouth daily. 60 capsule   . citalopram (CELEXA) 40 MG tablet TAKE 1 TABLET(40 MG) BY MOUTH DAILY 90 tablet 0  . HYDROcodone-acetaminophen (NORCO/VICODIN) 5-325 MG tablet TK 1 T PO Q 8 H PRN P FOR UP TO 30 DAYS  0  . NUVARING 0.12-0.015 MG/24HR vaginal ring     . omeprazole (PRILOSEC) 40 MG capsule Take 1 capsule (40 mg total) by mouth daily. 90 capsule 3  . ondansetron (ZOFRAN-ODT) 4 MG disintegrating tablet DISSOLVE 1 TABLET(4 MG) ON THE TONGUE EVERY 8 HOURS AS NEEDED FOR NAUSEA OR VOMITING 20 tablet 5  . rizatriptan (MAXALT) 10 MG tablet TAKE 1 TABLET BY MOUTH AT ONSET OF MIGRAINE. MAY REPEAT IN 2 HOURS AS NEEDED 10 tablet 5  . terbinafine (LAMISIL) 250 MG tablet TAKE 1 TABLET(250 MG) BY MOUTH DAILY 90 tablet 0  . tiZANidine (ZANAFLEX) 4 MG tablet Take 1 tablet (4 mg total) by mouth every 6 (six) hours as needed for muscle spasms. 30 tablet 5  . traZODone (DESYREL) 150 MG tablet TAKE 1 TABLET BY MOUTH EVERY NIGHT AS DIRECTED. MAY INCREASE TO 2 TABLETS EVERY NIGHT AS DIRECTED 90 tablet 1   No current facility-administered medications on file prior to visit.    Review of Systems     Objective:  There were no vitals filed  for this visit. There were no vitals filed for this visit. There is no height or weight on file to calculate BMI.  BP Readings from Last 3 Encounters:  11/23/22 118/72  10/23/22 114/80  08/06/22 127/79    Wt Readings from Last 3 Encounters:  11/23/22 164 lb (74.4 kg)  10/23/22 161 lb (73 kg)  08/06/22 154 lb (69.9 kg)       Physical Exam Constitutional: She appears well-developed and well-nourished. No distress.  HENT:  Head: Normocephalic and atraumatic.  Right Ear: External ear normal. Normal ear canal and TM Left Ear: External ear normal.  Normal ear canal and TM Mouth/Throat: Oropharynx is clear and moist.  Eyes: Conjunctivae normal.  Neck: Neck supple. No tracheal deviation present. No thyromegaly present.  No carotid bruit  Cardiovascular: Normal rate, regular rhythm and normal heart sounds.   No murmur heard.  No edema. Pulmonary/Chest: Effort normal and breath sounds normal. No respiratory distress. She has no wheezes. She has no rales.  Breast: deferred   Abdominal: Soft. She exhibits no distension. There is no tenderness.  Lymphadenopathy: She has no cervical adenopathy.  Skin: Skin is warm and dry. She is not diaphoretic.  Psychiatric: She has a normal mood and affect. Her behavior is normal.  Lab Results  Component Value Date   WBC 8.8 04/04/2022   HGB 13.6 04/04/2022   HCT 41.1 04/04/2022   PLT 369.0 04/04/2022   GLUCOSE 92 10/25/2022   CHOL 211 (H) 10/25/2022   TRIG 192.0 (H) 10/25/2022   HDL 65.70 10/25/2022   LDLCALC 107 (H) 10/25/2022   ALT 10 10/25/2022   AST 14 10/25/2022   NA 136 10/25/2022   K 4.4 10/25/2022   CL 103 10/25/2022   CREATININE 1.06 10/25/2022   BUN 14 10/25/2022   CO2 25 10/25/2022   TSH 2.42 04/04/2022   HGBA1C 5.2 05/29/2019         Assessment & Plan:   Physical exam: Screening blood work  ordered Exercise   Weight   Substance abuse  none   Reviewed recommended immunizations.   Health Maintenance   Topic Date Due  . COVID-19 Vaccine (1) Never done  . MAMMOGRAM  Never done  . Zoster Vaccines- Shingrix (1 of 2) Never done  . INFLUENZA VACCINE  07/11/2023  . DTaP/Tdap/Td (2 - Td or Tdap) 08/10/2027  . PAP SMEAR-Modifier  01/31/2028  . Colonoscopy  02/11/2028  . HPV VACCINES  Aged Out  . Hepatitis C Screening  Discontinued  . HIV Screening  Discontinued          See Problem List for Assessment and Plan of chronic medical problems.     This encounter was created in error - please disregard.

## 2023-04-10 NOTE — Telephone Encounter (Signed)
PA has been submitted and is pending determination, will be updated in additional encounter created. 

## 2023-04-10 NOTE — Patient Instructions (Addendum)
Blood work was ordered.   The lab is on the first floor.    Medications changes include :   none    A referral was ordered for XXX.     Someone will call you to schedule an appointment.    Return in about 6 months (around 10/12/2023) for follow up.    Health Maintenance, Female Adopting a healthy lifestyle and getting preventive care are important in promoting health and wellness. Ask your health care provider about: The right schedule for you to have regular tests and exams. Things you can do on your own to prevent diseases and keep yourself healthy. What should I know about diet, weight, and exercise? Eat a healthy diet  Eat a diet that includes plenty of vegetables, fruits, low-fat dairy products, and lean protein. Do not eat a lot of foods that are high in solid fats, added sugars, or sodium. Maintain a healthy weight Body mass index (BMI) is used to identify weight problems. It estimates body fat based on height and weight. Your health care provider can help determine your BMI and help you achieve or maintain a healthy weight. Get regular exercise Get regular exercise. This is one of the most important things you can do for your health. Most adults should: Exercise for at least 150 minutes each week. The exercise should increase your heart rate and make you sweat (moderate-intensity exercise). Do strengthening exercises at least twice a week. This is in addition to the moderate-intensity exercise. Spend less time sitting. Even light physical activity can be beneficial. Watch cholesterol and blood lipids Have your blood tested for lipids and cholesterol at 51 years of age, then have this test every 5 years. Have your cholesterol levels checked more often if: Your lipid or cholesterol levels are high. You are older than 51 years of age. You are at high risk for heart disease. What should I know about cancer screening? Depending on your health history and family  history, you may need to have cancer screening at various ages. This may include screening for: Breast cancer. Cervical cancer. Colorectal cancer. Skin cancer. Lung cancer. What should I know about heart disease, diabetes, and high blood pressure? Blood pressure and heart disease High blood pressure causes heart disease and increases the risk of stroke. This is more likely to develop in people who have high blood pressure readings or are overweight. Have your blood pressure checked: Every 3-5 years if you are 57-37 years of age. Every year if you are 1 years old or older. Diabetes Have regular diabetes screenings. This checks your fasting blood sugar level. Have the screening done: Once every three years after age 56 if you are at a normal weight and have a low risk for diabetes. More often and at a younger age if you are overweight or have a high risk for diabetes. What should I know about preventing infection? Hepatitis B If you have a higher risk for hepatitis B, you should be screened for this virus. Talk with your health care provider to find out if you are at risk for hepatitis B infection. Hepatitis C Testing is recommended for: Everyone born from [birth year] through 1964. Anyone with known risk factors for hepatitis C. Sexually transmitted infections (STIs) Get screened for STIs, including gonorrhea and chlamydia, if: You are sexually active and are younger than 51 years of age. You are older than 51 years of age and your health care provider tells you that you are  at risk for this type of infection. Your sexual activity has changed since you were last screened, and you are at increased risk for chlamydia or gonorrhea. Ask your health care provider if you are at risk. Ask your health care provider about whether you are at high risk for HIV. Your health care provider may recommend a prescription medicine to help prevent HIV infection. If you choose to take medicine to prevent HIV, you  should first get tested for HIV. You should then be tested every 3 months for as long as you are taking the medicine. Pregnancy If you are about to stop having your period (premenopausal) and you may become pregnant, seek counseling before you get pregnant. Take 400 to 800 micrograms (mcg) of folic acid every day if you become pregnant. Ask for birth control (contraception) if you want to prevent pregnancy. Osteoporosis and menopause Osteoporosis is a disease in which the bones lose minerals and strength with aging. This can result in bone fractures. If you are 16 years old or older, or if you are at risk for osteoporosis and fractures, ask your health care provider if you should: Be screened for bone loss. Take a calcium or vitamin D supplement to lower your risk of fractures. Be given hormone replacement therapy (HRT) to treat symptoms of menopause. Follow these instructions at home: Alcohol use Do not drink alcohol if: Your health care provider tells you not to drink. You are pregnant, may be pregnant, or are planning to become pregnant. If you drink alcohol: Limit how much you have to: 0-1 drink a day. Know how much alcohol is in your drink. In the [place] one drink equals one 12 oz bottle of beer (355 mL), one 5 oz glass of wine (148 mL), or one 1 oz glass of hard liquor (44 mL). Lifestyle Do not use any products that contain nicotine or tobacco. These products include cigarettes, chewing tobacco, and vaping devices, such as e-cigarettes. If you need help quitting, ask your health care provider. Do not use street drugs. Do not share needles. Ask your health care provider for help if you need support or information about quitting drugs. General instructions Schedule regular health, dental, and eye exams. Stay current with your vaccines. Tell your health care provider if: You often feel depressed. You have ever been abused or do not feel safe at home. Summary Adopting a healthy  lifestyle and getting preventive care are important in promoting health and wellness. Follow your health care provider's instructions about healthy diet, exercising, and getting tested or screened for diseases. Follow your health care provider's instructions on monitoring your cholesterol and blood pressure. This information is not intended to replace advice given to you by your health care provider. Make sure you discuss any questions you have with your health care provider. Document Revised: 04/17/2021 Document Reviewed: 04/17/2021 Elsevier Patient Education  [place].

## 2023-04-10 NOTE — Telephone Encounter (Signed)
PA request received via provider for Rizatriptan Benzoate 10MG  tablets  PA has been submitted to [facility] and is pending additional questions/determination  Key: [id number]  * She has already tried sumatriptan and zolmitriptan.

## 2023-04-11 ENCOUNTER — Encounter: Payer: BC Managed Care – PPO | Admitting: Internal Medicine

## 2023-04-11 DIAGNOSIS — B351 Tinea unguium: Secondary | ICD-10-CM

## 2023-04-11 DIAGNOSIS — Z0000 Encounter for general adult medical examination without abnormal findings: Secondary | ICD-10-CM

## 2023-04-11 DIAGNOSIS — F32A Depression, unspecified: Secondary | ICD-10-CM

## 2023-04-11 DIAGNOSIS — F5104 Psychophysiologic insomnia: Secondary | ICD-10-CM

## 2023-04-11 DIAGNOSIS — G43709 Chronic migraine without aura, not intractable, without status migrainosus: Secondary | ICD-10-CM

## 2023-04-11 DIAGNOSIS — N1831 Chronic kidney disease, stage 3a: Secondary | ICD-10-CM

## 2023-04-11 DIAGNOSIS — E7849 Other hyperlipidemia: Secondary | ICD-10-CM

## 2023-04-11 NOTE — Assessment & Plan Note (Signed)
Chronic Mild We will check CMP, CBC

## 2023-04-11 NOTE — Assessment & Plan Note (Signed)
Chronic ?Management per neurology-Dr. Jaffe ?

## 2023-04-11 NOTE — Assessment & Plan Note (Signed)
Chronic Somewhat controlled-having hot flashes at night and that is waking her up Continue trazodone 150 mg at night 

## 2023-04-11 NOTE — Assessment & Plan Note (Signed)
Chronic Overall controlled Continue citalopram 40 mg daily and bupropion XL 300 mg daily

## 2023-04-11 NOTE — Assessment & Plan Note (Signed)
Chronic Currently taking Lamisil to 50 mg daily for 3 months Liver tests normal prior to starting it Recheck liver tests today

## 2023-04-11 NOTE — Assessment & Plan Note (Signed)
Chronic Will check lipid panel, CMP Regular exercise, heart healthy diet encouraged Currently lifestyle controlled-continue

## 2023-04-24 ENCOUNTER — Other Ambulatory Visit: Payer: Self-pay | Admitting: Neurology

## 2023-04-29 ENCOUNTER — Other Ambulatory Visit: Payer: Self-pay | Admitting: Neurology

## 2023-04-30 ENCOUNTER — Other Ambulatory Visit: Payer: Self-pay

## 2023-04-30 MED ORDER — TOPIRAMATE 50 MG PO TABS: ORAL_TABLET | ORAL | 5 refills | Status: DC

## 2023-05-01 DIAGNOSIS — M47812 Spondylosis without myelopathy or radiculopathy, cervical region: Secondary | ICD-10-CM | POA: Diagnosis not present

## 2023-05-03 DIAGNOSIS — Z5181 Encounter for therapeutic drug level monitoring: Secondary | ICD-10-CM | POA: Diagnosis not present

## 2023-05-03 DIAGNOSIS — Z79899 Other long term (current) drug therapy: Secondary | ICD-10-CM | POA: Diagnosis not present

## 2023-05-10 ENCOUNTER — Telehealth: Payer: Self-pay | Admitting: Pharmacy Technician

## 2023-05-10 ENCOUNTER — Other Ambulatory Visit (HOSPITAL_COMMUNITY): Payer: Self-pay

## 2023-05-10 DIAGNOSIS — Z5181 Encounter for therapeutic drug level monitoring: Secondary | ICD-10-CM | POA: Diagnosis not present

## 2023-05-10 DIAGNOSIS — Z79899 Other long term (current) drug therapy: Secondary | ICD-10-CM | POA: Diagnosis not present

## 2023-05-10 NOTE — Telephone Encounter (Signed)
Patient Advocate Encounter  Received notification from [facility] that prior authorization for RIZATRIPTAN 10MG  is required.   PA submitted on 5.31.24 Key [id number] Status is pending

## 2023-05-14 ENCOUNTER — Other Ambulatory Visit: Payer: Self-pay | Admitting: Neurology

## 2023-05-14 ENCOUNTER — Encounter: Payer: Self-pay | Admitting: Neurology

## 2023-05-15 NOTE — Telephone Encounter (Signed)
Patient Advocate Encounter  Prior Authorization for Rizatriptan Benzoate 10MG  tablets has been approved through [employer].    Key: [id number]  Effective: 05-10-2023 to 05-09-2024

## 2023-05-17 DIAGNOSIS — Z5181 Encounter for therapeutic drug level monitoring: Secondary | ICD-10-CM | POA: Diagnosis not present

## 2023-05-17 DIAGNOSIS — Z79899 Other long term (current) drug therapy: Secondary | ICD-10-CM | POA: Diagnosis not present

## 2023-05-21 ENCOUNTER — Telehealth: Payer: Self-pay

## 2023-05-21 NOTE — Telephone Encounter (Signed)
Per Josh at the pharmacy the Rizatriptan will not go through per quantity exceeded the limit.

## 2023-05-21 NOTE — Telephone Encounter (Signed)
PA needed for Aimovig 140 mg 

## 2023-05-22 ENCOUNTER — Other Ambulatory Visit (HOSPITAL_COMMUNITY): Payer: Self-pay

## 2023-05-22 NOTE — Telephone Encounter (Signed)
Test claim results that drug is able to be filled #10 per 30 with a $11.94 co-pay

## 2023-05-23 ENCOUNTER — Other Ambulatory Visit (HOSPITAL_COMMUNITY): Payer: Self-pay

## 2023-05-23 ENCOUNTER — Telehealth: Payer: Self-pay

## 2023-05-23 NOTE — Telephone Encounter (Signed)
*  [facility]  PA request received for Aimovig 140MG /ML auto-injectors  PA submitted to [employer] via [employer] and is pending additional questions/determination  Key: [id number]

## 2023-05-23 NOTE — Telephone Encounter (Signed)
Pa started and is pending determination. Will be updated in additional encounter created.

## 2023-05-23 NOTE — Progress Notes (Unsigned)
Per Patient, Telephone call to her insurance, She will need a Prior Auth for Amiovig 140 mg.

## 2023-06-06 DIAGNOSIS — M47817 Spondylosis without myelopathy or radiculopathy, lumbosacral region: Secondary | ICD-10-CM | POA: Diagnosis not present

## 2023-06-06 DIAGNOSIS — M47816 Spondylosis without myelopathy or radiculopathy, lumbar region: Secondary | ICD-10-CM | POA: Diagnosis not present

## 2023-06-10 NOTE — Telephone Encounter (Signed)
Original request expired, renewed and pending additional questions/determination  Key: [id number]

## 2023-06-11 NOTE — Telephone Encounter (Signed)
Additional questions received and answered, submitted and pending determination.

## 2023-06-20 ENCOUNTER — Other Ambulatory Visit (HOSPITAL_COMMUNITY): Payer: Self-pay

## 2023-06-20 NOTE — Telephone Encounter (Signed)
Patient Advocate Encounter  Prior Authorization for AIMOVIG 140MG  has been approved with [employer].    PA# [id number] Effective dates: 07.3.24 through 7.2.25  Per [facility] test claim, copay for 30 days supply is $0

## 2023-06-21 ENCOUNTER — Other Ambulatory Visit: Payer: Self-pay | Admitting: Internal Medicine

## 2023-06-27 ENCOUNTER — Encounter: Payer: Self-pay | Admitting: Neurology

## 2023-06-27 DIAGNOSIS — M797 Fibromyalgia: Secondary | ICD-10-CM | POA: Diagnosis not present

## 2023-06-27 DIAGNOSIS — G894 Chronic pain syndrome: Secondary | ICD-10-CM | POA: Diagnosis not present

## 2023-06-27 DIAGNOSIS — M792 Neuralgia and neuritis, unspecified: Secondary | ICD-10-CM | POA: Diagnosis not present

## 2023-06-27 DIAGNOSIS — M47812 Spondylosis without myelopathy or radiculopathy, cervical region: Secondary | ICD-10-CM | POA: Diagnosis not present

## 2023-07-08 NOTE — Patient Instructions (Addendum)
     Have blood work today   Medications changes include :   Paxlovid 3 tabs twice daily for 5 day.  Hold crestor while on paxlovid.   Decrease the amlodipine to 5 mg while taking paxlovid.    Return if symptoms worsen or fail to improve.    

## 2023-07-08 NOTE — Progress Notes (Unsigned)
    Subjective:    Patient ID: Teresa Campbell, female    DOB: 07/25/1971, 52 y.o.   MRN: [id number]      HPI [name] is here for No chief complaint on file.        Medications and allergies reviewed with patient and updated if appropriate.  Current Outpatient Medications on File Prior to Visit  Medication Sig Dispense Refill   albuterol (VENTOLIN HFA) 108 (90 Base) MCG/ACT inhaler Inhale 2 puffs into the lungs every 6 (six) hours as needed for wheezing or shortness of breath. 8 g 0   buPROPion (WELLBUTRIN XL) 300 MG 24 hr tablet TAKE 1 TABLET(300 MG) BY MOUTH DAILY 90 tablet 3   Cholecalciferol (VITAMIN D3) 25 MCG (1000 UT) CAPS Take 1 capsule (1,000 Units total) by mouth daily. 60 capsule    citalopram (CELEXA) 40 MG tablet TAKE 1 TABLET(40 MG) BY MOUTH DAILY 30 tablet 0   Erenumab-aooe (AIMOVIG) 140 MG/ML SOAJ ADMINISTER 1 ML UNDER THE SKIN EVERY 30 DAYS 1 mL 5   HYDROcodone-acetaminophen (NORCO/VICODIN) 5-325 MG tablet TK 1 T PO Q 8 H PRN P FOR UP TO 30 DAYS  0   NUVARING 0.12-0.015 MG/24HR vaginal ring      omeprazole (PRILOSEC) 40 MG capsule Take 1 capsule (40 mg total) by mouth daily. 90 capsule 3   ondansetron (ZOFRAN-ODT) 4 MG disintegrating tablet DISSOLVE 1 TABLET(4 MG) ON THE TONGUE EVERY 8 HOURS AS NEEDED FOR NAUSEA OR VOMITING 20 tablet 5   rizatriptan (MAXALT) 10 MG tablet TAKE 1 TABLET BY MOUTH AT ONSET OF MIGRAINE. MAY REPEAT IN 2 HOURS AS NEEDED 10 tablet 5   terbinafine (LAMISIL) 250 MG tablet TAKE 1 TABLET(250 MG) BY MOUTH DAILY 90 tablet 0   tiZANidine (ZANAFLEX) 4 MG tablet Take 1 tablet (4 mg total) by mouth every 6 (six) hours as needed for muscle spasms. 30 tablet 5   topiramate (TOPAMAX) 50 MG tablet 2 TABLETS TWICE DAILY 120 tablet 5   traZODone (DESYREL) 150 MG tablet TAKE 1 TABLET BY MOUTH EVERY NIGHT AS DIRECTED. MAY INCREASE TO 2 TABLETS EVERY NIGHT AS DIRECTED 90 tablet 1   No current facility-administered medications on file prior to visit.    Review  of Systems     Objective:  There were no vitals filed for this visit. BP Readings from Last 3 Encounters:  11/23/22 118/72  10/23/22 114/80  08/06/22 127/79   Wt Readings from Last 3 Encounters:  11/23/22 164 lb (74.4 kg)  10/23/22 161 lb (73 kg)  08/06/22 154 lb (69.9 kg)   There is no height or weight on file to calculate BMI.    Physical Exam         Assessment & Plan:    See Problem List for Assessment and Plan of chronic medical problems.

## 2023-07-08 NOTE — Progress Notes (Unsigned)
NEUROLOGY FOLLOW UP OFFICE NOTE  Teresa Campbell [id number]  Assessment/Plan:     1.  Spell of lightheadedness and tremulousness - history of prior similar spells but never as severe as this episode and never before with tremulousness.  Suspect that it was triggered by combination of sleep deprivation, hunger (possibly hypoglycemia as glucose was 73 after drinking a cola) and having taken an extra bupropion and then complicated by onset of anxiety.  Semiology not consistent with seizure or what I think would be a primary neurologic event. 2.  Migraine with aura, without status migrainosus, not intractable - stable     1.  Migraine prevention:  Aimovig 140mg  every 28 days.  Taper off of topiramate as she would like to shorten her medication list and may not need it with Aimovig.  2.  Migraine rescue:  Rizatriptan 10mg  for severe migraines; ibuprofen with tizanidine for moderate/cervicogenic headaches.  Zofran ODT for nausea. 3.  Limit use of pain relievers to no more than 2 days out of week to prevent risk of rebound or medication-overuse headache. 4.  Keep headache diary 5.  Follow up 6 months   Subjective:  Teresa Campbell is a 52 year old right-handed woman with fibromyalgia and depression who follows up for migraines and new *** upper extremity pain.   UPDATE: Restarted topiramate due to increased migraines.   Intensity:  Moderate to severe Duration:  less than hour if takes rizatriptan early enough.  Otherwise several hours. Frequency:  3-4 a month on average.  ***.  Followed by pain management for chronic pain.  Prior cervical spine treatments include bilateral C3-6 RFA with relief.  Prior MRI of cervical spine on 01/06/2016 revealed reversal of cervical lordosis at C4-5 level with mild disc bulges at C4-5 and C5-6 without spinal stenosis or nerve root impingement.    Rescue:  For moderate:  Tizanidine, ibuprofen and ice; For severe:  Rizatriptan Current NSAIDS: Excedrin  Migraine Current analgesics: hydrocodone (fibromyalgia) Current triptans: Rizatriptan 10 mg Current ergotamine:  none Current anti-emetic:  ondansetron ODT 4mg   Current muscle relaxants:  tizanidine 2mg  PRN (muscle spasms) Current anti-anxiolytic:  none Current sleep aide: Trazodone Current Antihypertensive medications:  none Current Antidepressant medications: citalopram 40 mg daily, bupropion 150 mg Current Anticonvulsant medications: Topiramate 100 mg twice daily (on higher doses she had hair thinning, weight loss) Current anti-CGRP:  none Current Vitamins/Herbal/Supplements:  B12 Current Antihistamines/Decongestants:  meclizine Other therapy:  cervical radiofrequency ablations; PT neck; ketamine infusions for fibromyalgia, cold compress, heating pad. Hormone/birth control: NuvaRing   Caffeine:  1 cup of coffee daily Diet:  Drinks water all day.  Eats healthy Exercise:  routine Depression:  yes; Anxiety:  yes Other pain:  fibromyalgia, lumbar radiculopathy Sleep hygiene:  Okay with trazodone and exercise     HISTORY:  Migraines: Onset:  Childhood.  Missed school as a child.  Worse after starting her menstrual cycle. Location:  Bi-frontal Quality:  Pounding Initial Intensity:  Severe.  She denies new headache, thunderclap headache or severe headache that wakes her from sleep. Aura:  Flashing lights/blue colors Prodrome:  none Postdrome:  fatigue Associated symptoms:  Photophobia, phonophobia, nausea, osmophobia, sometimes vomiting.  She denies associated unilateral numbness or weakness. Initial Duration:  2 days with Maxalt (1 hour if taken at onset) Initial Frequency:  2 migraines past 30 days (15-17 a month prior to topiramate) Initial Frequency of abortive medication: 2 days past month Triggers: increased depression Relieving factors:  Ice pack on head and heat to back of  neck Activity:  aggravates   She has neck pain and mild to moderate right sided jaw pain radiating  to the temple.  She was evaluated for TMJ   Past NSAIDS:  Ibuprofen (kidney problems), naproxen, Toradol shot Past analgesics:  Tylenol, Excedrin Past abortive triptans: Zomig, sumatriptan tablet Past abortive ergotamine:  none Past muscle relaxants:  none Past anti-emetic:  none Past antihypertensive medications:  none Past antidepressant medications:  Amitriptyline 25mg  Past anticonvulsant medications:  gabapentin Past anti-CGRP:  Nurtec Past vitamins/Herbal/Supplements:  none Past antihistamines/decongestants:  none Other past therapies:  Botox (effective.  However, she was having trouble getting the Botox due to cost), Gamacor vagus nerve stimulator (abortive treatment, effective), cervical nerve ablations (helpful).   Family history of headache:  Father, paternal grandfather, 2 aunts   Transient Spells: In June 2020, she was in the grocery store when she started feeling dizzy.  She couldn't hear what the lady at check out was saying.  Everything was cloudy and her legs felt they were going to buckle.  She felt hot and diaphoretic.  She sat in her car with the air conditioner blowing, drank a water and ate a honey bun.  She felt better but was fatigued for the rest of the day.  She reports similar events in the past but not as severe since childhood.  She did have another episode the next day as well, less severe.  She drank orange juice and peanut butter crackers and it resolved.  No associated headaches.  She has had other events as well.  When she was around 41, she was at work and she became confused.  She couldn't understand her coworkers and she was lethargic.  She was unable to communicate with them.  She went to the ED where testing was unremarkable.  She had a similar event about a year ago.  She is unsure if associated with headaches because she suffers from so many headaches.  Awake and asleep EEG from 07/01/2019 was normal.  MRI of brain with and without contrast from 07/10/2019 was  normal.  She has a spell similar to her prior events in August 2023, but never before as severe.  She wasn't able to sleep the previous night.  She didn't eat breakfast because she had a meeting at work but drank 3 cups of coffee.  On 8/22 at around 11:30 AM, she was at work and sat down at her desk following a meeting when she suddenly developed numbness and tingling in her fingertips and toes with sensation traveling up her body.  She saw brown spots in her vision and also felt lightheaded, like she was going to pass out, so she lowered herself onto the floor.  She became tremulous, shaking all over.  No loss of consciousness.  Her Emergency planning/management officer brought her a cola to drink.  Her pulse was high.  Blood sugar was 74-78.  .  Tremulousness gradually resolved over an hour.  That evening she began having difficulty with dexterity and trouble typing.  She also endorsed chest tightness and anxiety.  Over the next couple of days, she didn't feel right.  She still had some chest discomfort.  During this period, she had severe insomnia and wasn't able to sleep at all.  That has since resolved.  On the morning of the event, she realized that she had accidentally took an extra bupropion.    PAST MEDICAL HISTORY: Past Medical History:  Diagnosis Date   Colon polyps    Depression  Fibromyalgia    GERD (gastroesophageal reflux disease)    Hemorrhoids    Insomnia    Migraine     MEDICATIONS: Current Outpatient Medications on File Prior to Visit  Medication Sig Dispense Refill   albuterol (VENTOLIN HFA) 108 (90 Base) MCG/ACT inhaler Inhale 2 puffs into the lungs every 6 (six) hours as needed for wheezing or shortness of breath. 8 g 0   buPROPion (WELLBUTRIN XL) 300 MG 24 hr tablet TAKE 1 TABLET(300 MG) BY MOUTH DAILY 90 tablet 3   Cholecalciferol (VITAMIN D3) 25 MCG (1000 UT) CAPS Take 1 capsule (1,000 Units total) by mouth daily. 60 capsule    citalopram (CELEXA) 40 MG tablet TAKE 1 TABLET(40 MG) BY MOUTH  DAILY 30 tablet 0   Erenumab-aooe (AIMOVIG) 140 MG/ML SOAJ ADMINISTER 1 ML UNDER THE SKIN EVERY 30 DAYS 1 mL 5   HYDROcodone-acetaminophen (NORCO/VICODIN) 5-325 MG tablet TK 1 T PO Q 8 H PRN P FOR UP TO 30 DAYS  0   NUVARING 0.12-0.015 MG/24HR vaginal ring      omeprazole (PRILOSEC) 40 MG capsule Take 1 capsule (40 mg total) by mouth daily. 90 capsule 3   ondansetron (ZOFRAN-ODT) 4 MG disintegrating tablet DISSOLVE 1 TABLET(4 MG) ON THE TONGUE EVERY 8 HOURS AS NEEDED FOR NAUSEA OR VOMITING 20 tablet 5   rizatriptan (MAXALT) 10 MG tablet TAKE 1 TABLET BY MOUTH AT ONSET OF MIGRAINE. MAY REPEAT IN 2 HOURS AS NEEDED 10 tablet 5   terbinafine (LAMISIL) 250 MG tablet TAKE 1 TABLET(250 MG) BY MOUTH DAILY 90 tablet 0   tiZANidine (ZANAFLEX) 4 MG tablet Take 1 tablet (4 mg total) by mouth every 6 (six) hours as needed for muscle spasms. 30 tablet 5   topiramate (TOPAMAX) 50 MG tablet 2 TABLETS TWICE DAILY 120 tablet 5   traZODone (DESYREL) 150 MG tablet TAKE 1 TABLET BY MOUTH EVERY NIGHT AS DIRECTED. MAY INCREASE TO 2 TABLETS EVERY NIGHT AS DIRECTED 90 tablet 1   No current facility-administered medications on file prior to visit.    ALLERGIES: Allergies  Allergen Reactions   Cymbalta [Duloxetine Hcl]     Ineffective   Lyrica [Pregabalin] Other (See Comments)    Ineffective   Tramadol     Ineffective     FAMILY HISTORY: Family History  Problem Relation Age of Onset   Arthritis Mother    COPD Mother    Depression Mother    Hyperlipidemia Mother    Miscarriages / [place] Mother    Arthritis Father    Depression Father    Heart disease Father    Hyperlipidemia Father    Alcohol abuse Sister    Depression Sister    Drug abuse Sister    Hyperlipidemia Sister    Hypertension Sister    Alcohol abuse Brother    Depression Brother    Drug abuse Brother    Depression Daughter    Alcohol abuse Maternal Grandmother    Depression Maternal Grandmother    Stroke Maternal Grandfather     Arthritis Paternal Grandmother    Diabetes Paternal Grandmother    Stroke Paternal Grandfather    Alcohol abuse Sister    Depression Sister    Drug abuse Sister    Depression Brother       Objective:  Blood pressure 127/79, pulse 81, height 5\' 5"  (1.651 m), weight 154 lb (69.9 kg), SpO2 98 %. General: No acute distress.  Patient appears well-groomed.   Head:  Normocephalic/atraumatic Eyes:  Fundi examined  but not visualized Neck: supple, no paraspinal tenderness, full range of motion Heart:  Regular rate and rhythm Neurological Exam: alert and oriented to person, place, and time.  Speech fluent and not dysarthric, language intact.  CN II-XII intact. Bulk and tone normal, muscle strength 5/5 throughout.  Sensation to light touch intact.  Deep tendon reflexes 2+ throughout, toes downgoing.  Finger to nose testing intact.  Gait normal, Romberg negative.   Shon Millet, DO  CC: Cheryll Cockayne, MD

## 2023-07-09 ENCOUNTER — Encounter: Payer: Self-pay | Admitting: Neurology

## 2023-07-09 ENCOUNTER — Ambulatory Visit: Payer: BC Managed Care – PPO | Admitting: Internal Medicine

## 2023-07-09 ENCOUNTER — Ambulatory Visit (INDEPENDENT_AMBULATORY_CARE_PROVIDER_SITE_OTHER): Payer: BC Managed Care – PPO | Admitting: Neurology

## 2023-07-09 VITALS — BP 124/80 | HR 80 | Temp 98.5°F | Ht 65.0 in | Wt 167.0 lb

## 2023-07-09 VITALS — BP 117/77 | HR 82 | Resp 18 | Ht 65.0 in | Wt 167.0 lb

## 2023-07-09 DIAGNOSIS — M79602 Pain in left arm: Secondary | ICD-10-CM | POA: Diagnosis not present

## 2023-07-09 DIAGNOSIS — R0981 Nasal congestion: Secondary | ICD-10-CM

## 2023-07-09 DIAGNOSIS — U071 COVID-19: Secondary | ICD-10-CM | POA: Diagnosis not present

## 2023-07-09 DIAGNOSIS — R29898 Other symptoms and signs involving the musculoskeletal system: Secondary | ICD-10-CM

## 2023-07-09 LAB — POC COVID19 BINAXNOW: SARS Coronavirus 2 Ag: POSITIVE — AB

## 2023-07-09 MED ORDER — ALBUTEROL SULFATE HFA 108 (90 BASE) MCG/ACT IN AERS: 2.0000 | INHALATION_SPRAY | Freq: Four times a day (QID) | RESPIRATORY_TRACT | 2 refills | Status: AC | PRN

## 2023-07-09 MED ORDER — PROMETHAZINE-DM 6.25-15 MG/5ML PO SYRP
5.0000 mL | ORAL_SOLUTION | Freq: Four times a day (QID) | ORAL | 0 refills | Status: DC | PRN
Start: 2023-07-09 — End: 2023-07-24

## 2023-07-09 MED ORDER — TIZANIDINE HCL 4 MG PO TABS: 4.0000 mg | ORAL_TABLET | Freq: Four times a day (QID) | ORAL | 5 refills | Status: DC | PRN

## 2023-07-09 NOTE — Patient Instructions (Addendum)
Check nerve conduction study of left arm.  Further recommendations pending results. Follow up 5 months.

## 2023-07-09 NOTE — Assessment & Plan Note (Signed)
Acute Symptoms started 5 days ago, today is day 6 of symptoms She is out of window for Paxlovid Discussed symptomatic treatment Start promethazine-DM cough syrup at night, start using albuterol inhaler every 6 hours as needed for wheeze, chest tightness Continue gargling, Tylenol as needed, DayQuil Increase rest and fluids Note given to be out of work for the next 3 days Call if no improvement/symptoms worsening or if there is any questions

## 2023-07-10 ENCOUNTER — Encounter (INDEPENDENT_AMBULATORY_CARE_PROVIDER_SITE_OTHER): Payer: Self-pay

## 2023-07-17 ENCOUNTER — Encounter: Payer: Self-pay | Admitting: Internal Medicine

## 2023-07-20 ENCOUNTER — Other Ambulatory Visit: Payer: Self-pay | Admitting: Internal Medicine

## 2023-07-22 DIAGNOSIS — M533 Sacrococcygeal disorders, not elsewhere classified: Secondary | ICD-10-CM | POA: Diagnosis not present

## 2023-07-23 ENCOUNTER — Encounter: Payer: Self-pay | Admitting: Internal Medicine

## 2023-07-23 NOTE — Telephone Encounter (Signed)
No labs in epic pls advise.Marland KitchenJohny Campbell

## 2023-07-24 ENCOUNTER — Encounter: Payer: Self-pay | Admitting: Internal Medicine

## 2023-07-24 NOTE — Patient Instructions (Addendum)
Try OTC AZO for pain relief.    Blood work was ordered.   The lab is on the first floor.    Medications changes include :   pepcid 40 mg at bedtime or increase omeprazole to 40 mg twice a day - take 30 minutes prior to meals    [facility] Mammography [facility] Mammography service in [place], [place] Loc[place] in: [facility] Address: [address], [place], [place] [address] Phone: [phone]    Return in about 6 months (around 01/25/2024) for follow up.    Health Maintenance, Female Adopting a healthy lifestyle and getting preventive care are important in promoting health and wellness. Ask your health care provider about: The right schedule for you to have regular tests and exams. Things you can do on your own to prevent diseases and keep yourself healthy. What should I know about diet, weight, and exercise? Eat a healthy diet  Eat a diet that includes plenty of vegetables, fruits, low-fat dairy products, and lean protein. Do not eat a lot of foods that are high in solid fats, added sugars, or sodium. Maintain a healthy weight Body mass index (BMI) is used to identify weight problems. It estimates body fat based on height and weight. Your health care provider can help determine your BMI and help you achieve or maintain a healthy weight. Get regular exercise Get regular exercise. This is one of the most important things you can do for your health. Most adults should: Exercise for at least 150 minutes each week. The exercise should increase your heart rate and make you sweat (moderate-intensity exercise). Do strengthening exercises at least twice a week. This is in addition to the moderate-intensity exercise. Spend less time sitting. Even light physical activity can be beneficial. Watch cholesterol and blood lipids Have your blood tested for lipids and cholesterol at 51 years of age, then have this test every 5 years. Have your cholesterol  levels checked more often if: Your lipid or cholesterol levels are high. You are older than 51 years of age. You are at high risk for heart disease. What should I know about cancer screening? Depending on your health history and family history, you may need to have cancer screening at various ages. This may include screening for: Breast cancer. Cervical cancer. Colorectal cancer. Skin cancer. Lung cancer. What should I know about heart disease, diabetes, and high blood pressure? Blood pressure and heart disease High blood pressure causes heart disease and increases the risk of stroke. This is more likely to develop in people who have high blood pressure readings or are overweight. Have your blood pressure checked: Every 3-5 years if you are 90-58 years of age. Every year if you are 64 years old or older. Diabetes Have regular diabetes screenings. This checks your fasting blood sugar level. Have the screening done: Once every three years after age 64 if you are at a normal weight and have a low risk for diabetes. More often and at a younger age if you are overweight or have a high risk for diabetes. What should I know about preventing infection? Hepatitis B If you have a higher risk for hepatitis B, you should be screened for this virus. Talk with your health care provider to find out if you are at risk for hepatitis B infection. Hepatitis C Testing is recommended for: Everyone born from [birth year] through 1964. Anyone with known risk factors for hepatitis C. Sexually transmitted infections (STIs) Get screened  for STIs, including gonorrhea and chlamydia, if: You are sexually active and are younger than 51 years of age. You are older than 51 years of age and your health care provider tells you that you are at risk for this type of infection. Your sexual activity has changed since you were last screened, and you are at increased risk for chlamydia or gonorrhea. Ask your health care provider if  you are at risk. Ask your health care provider about whether you are at high risk for HIV. Your health care provider may recommend a prescription medicine to help prevent HIV infection. If you choose to take medicine to prevent HIV, you should first get tested for HIV. You should then be tested every 3 months for as long as you are taking the medicine. Pregnancy If you are about to stop having your period (premenopausal) and you may become pregnant, seek counseling before you get pregnant. Take 400 to 800 micrograms (mcg) of folic acid every day if you become pregnant. Ask for birth control (contraception) if you want to prevent pregnancy. Osteoporosis and menopause Osteoporosis is a disease in which the bones lose minerals and strength with aging. This can result in bone fractures. If you are 27 years old or older, or if you are at risk for osteoporosis and fractures, ask your health care provider if you should: Be screened for bone loss. Take a calcium or vitamin D supplement to lower your risk of fractures. Be given hormone replacement therapy (HRT) to treat symptoms of menopause. Follow these instructions at home: Alcohol use Do not drink alcohol if: Your health care provider tells you not to drink. You are pregnant, may be pregnant, or are planning to become pregnant. If you drink alcohol: Limit how much you have to: 0-1 drink a day. Know how much alcohol is in your drink. In the [place] one drink equals one 12 oz bottle of beer (355 mL), one 5 oz glass of wine (148 mL), or one 1 oz glass of hard liquor (44 mL). Lifestyle Do not use any products that contain nicotine or tobacco. These products include cigarettes, chewing tobacco, and vaping devices, such as e-cigarettes. If you need help quitting, ask your health care provider. Do not use street drugs. Do not share needles. Ask your health care provider for help if you need support or information about quitting drugs. General  instructions Schedule regular health, dental, and eye exams. Stay current with your vaccines. Tell your health care provider if: You often feel depressed. You have ever been abused or do not feel safe at home. Summary Adopting a healthy lifestyle and getting preventive care are important in promoting health and wellness. Follow your health care provider's instructions about healthy diet, exercising, and getting tested or screened for diseases. Follow your health care provider's instructions on monitoring your cholesterol and blood pressure. This information is not intended to replace advice given to you by your health care provider. Make sure you discuss any questions you have with your health care provider. Document Revised: 04/17/2021 Document Reviewed: 04/17/2021 Elsevier Patient Education  2024 [employer].

## 2023-07-24 NOTE — Progress Notes (Unsigned)
Subjective:    Patient ID: Teresa Campbell, female    DOB: 1971/06/01, 52 y.o.   MRN: [id number]      HPI Teresa Campbell is here for a Physical exam and her chronic medical problems.   Residual  - cough and fatigue from covid the end of last month.    One week ago had terrible bladder spasms - she had to leave work.  She has pressure and a feeling of needing to pee.  She is does have interstitial cystitis and thinks it is that, but could be an infection.  She denies any dysuria.  Medications and allergies reviewed with patient and updated if appropriate.  Current Outpatient Medications on File Prior to Visit  Medication Sig Dispense Refill   albuterol (VENTOLIN HFA) 108 (90 Base) MCG/ACT inhaler Inhale 2 puffs into the lungs every 6 (six) hours as needed for wheezing or shortness of breath. 8 g 2   buPROPion (WELLBUTRIN XL) 300 MG 24 hr tablet TAKE 1 TABLET(300 MG) BY MOUTH DAILY 90 tablet 3   Cholecalciferol (VITAMIN D3) 25 MCG (1000 UT) CAPS Take 1 capsule (1,000 Units total) by mouth daily. 60 capsule    citalopram (CELEXA) 40 MG tablet TAKE 1 TABLET(40 MG) BY MOUTH DAILY 30 tablet 0   Erenumab-aooe (AIMOVIG) 140 MG/ML SOAJ ADMINISTER 1 ML UNDER THE SKIN EVERY 30 DAYS 1 mL 5   HYDROcodone-acetaminophen (NORCO/VICODIN) 5-325 MG tablet TK 1 T PO Q 8 H PRN P FOR UP TO 30 DAYS  0   NUVARING 0.12-0.015 MG/24HR vaginal ring      omeprazole (PRILOSEC) 40 MG capsule Take 1 capsule (40 mg total) by mouth daily. 90 capsule 3   ondansetron (ZOFRAN-ODT) 4 MG disintegrating tablet DISSOLVE 1 TABLET(4 MG) ON THE TONGUE EVERY 8 HOURS AS NEEDED FOR NAUSEA OR VOMITING 20 tablet 5   rizatriptan (MAXALT) 10 MG tablet TAKE 1 TABLET BY MOUTH AT ONSET OF MIGRAINE. MAY REPEAT IN 2 HOURS AS NEEDED 10 tablet 5   terbinafine (LAMISIL) 250 MG tablet TAKE 1 TABLET(250 MG) BY MOUTH DAILY 90 tablet 0   tiZANidine (ZANAFLEX) 4 MG tablet Take 1 tablet (4 mg total) by mouth every 6 (six) hours as needed for muscle spasms.  30 tablet 5   topiramate (TOPAMAX) 50 MG tablet 2 TABLETS TWICE DAILY 120 tablet 5   traZODone (DESYREL) 150 MG tablet TAKE 1 TABLET BY MOUTH EVERY NIGHT AS DIRECTED. MAY INCREASE TO 2 TABLETS EVERY NIGHT AS DIRECTED 90 tablet 1   No current facility-administered medications on file prior to visit.    Review of Systems  Constitutional:  Positive for fatigue. Negative for fever.  Eyes:  Negative for visual disturbance.  Respiratory:  Positive for cough. Negative for shortness of breath and wheezing.   Cardiovascular:  Negative for chest pain, palpitations and leg swelling.  Gastrointestinal:  Positive for abdominal pain (discomfort from constipation) and constipation. Negative for blood in stool and diarrhea.       Gerd not controlled  Genitourinary:  Negative for dysuria and hematuria.  Musculoskeletal:  Positive for arthralgias and back pain.  Skin:  Negative for rash.  Neurological:  Positive for light-headedness and headaches (migraines controlled).  Psychiatric/Behavioral:  Positive for dysphoric mood and sleep disturbance. The patient is nervous/anxious.        Objective:   Vitals:   07/25/23 0847  BP: 116/76  Pulse: 77  Temp: 97.6 F (36.4 C)  SpO2: 98%   Filed Weights   07/25/23  0847  Weight: 164 lb 3.2 oz (74.5 kg)   Body mass index is 27.32 kg/m.  BP Readings from Last 3 Encounters:  07/25/23 116/76  07/09/23 124/80  07/09/23 117/77    Wt Readings from Last 3 Encounters:  07/25/23 164 lb 3.2 oz (74.5 kg)  07/09/23 167 lb (75.8 kg)  07/09/23 167 lb (75.8 kg)       Physical Exam Constitutional: She appears well-developed and well-nourished. No distress.  HENT:  Head: Normocephalic and atraumatic.  Right Ear: External ear normal. Normal ear canal and TM Left Ear: External ear normal.  Normal ear canal and TM Mouth/Throat: Oropharynx is clear and moist.  Eyes: Conjunctivae normal.  Neck: Neck supple. No tracheal deviation present. No thyromegaly  present.  No carotid bruit  Cardiovascular: Normal rate, regular rhythm and normal heart sounds.   No murmur heard.  No edema. Pulmonary/Chest: Effort normal and breath sounds normal. No respiratory distress. She has no wheezes. She has no rales.  Breast: deferred   Abdominal: Soft. She exhibits no distension. There is no tenderness.  Lymphadenopathy: She has no cervical adenopathy.  Skin: Skin is warm and dry. She is not diaphoretic.  Psychiatric: She has a normal mood and affect. Her behavior is normal.     Lab Results  Component Value Date   WBC 8.8 04/04/2022   HGB 13.6 04/04/2022   HCT 41.1 04/04/2022   PLT 369.0 04/04/2022   GLUCOSE 92 10/25/2022   CHOL 211 (H) 10/25/2022   TRIG 192.0 (H) 10/25/2022   HDL 65.70 10/25/2022   LDLCALC 107 (H) 10/25/2022   ALT 10 10/25/2022   AST 14 10/25/2022   NA 136 10/25/2022   K 4.4 10/25/2022   CL 103 10/25/2022   CREATININE 1.06 10/25/2022   BUN 14 10/25/2022   CO2 25 10/25/2022   TSH 2.42 04/04/2022   HGBA1C 5.2 05/29/2019         Assessment & Plan:   Physical exam: Screening blood work  ordered Exercise  none - encouraged regular exercise Weight is okay Substance abuse  none   Reviewed recommended immunizations.   Health Maintenance  Topic Date Due   MAMMOGRAM  Never done   Zoster Vaccines- Shingrix (1 of 2) Never done   COVID-19 Vaccine (1 - 2023-24 season) Never done   INFLUENZA VACCINE  07/11/2023   DTaP/Tdap/Td (2 - Td or Tdap) 08/10/2027   PAP SMEAR-Modifier  01/31/2028   Colonoscopy  02/11/2028   HPV VACCINES  Aged Out   Hepatitis C Screening  Discontinued   HIV Screening  Discontinued          See Problem List for Assessment and Plan of chronic medical problems.

## 2023-07-25 ENCOUNTER — Encounter: Payer: Self-pay | Admitting: Internal Medicine

## 2023-07-25 ENCOUNTER — Ambulatory Visit (INDEPENDENT_AMBULATORY_CARE_PROVIDER_SITE_OTHER): Payer: BC Managed Care – PPO | Admitting: Internal Medicine

## 2023-07-25 VITALS — BP 116/76 | HR 77 | Temp 97.6°F | Ht 65.0 in | Wt 164.2 lb

## 2023-07-25 DIAGNOSIS — R350 Frequency of micturition: Secondary | ICD-10-CM

## 2023-07-25 DIAGNOSIS — N3289 Other specified disorders of bladder: Secondary | ICD-10-CM

## 2023-07-25 DIAGNOSIS — F419 Anxiety disorder, unspecified: Secondary | ICD-10-CM

## 2023-07-25 DIAGNOSIS — E7849 Other hyperlipidemia: Secondary | ICD-10-CM | POA: Diagnosis not present

## 2023-07-25 DIAGNOSIS — Z0000 Encounter for general adult medical examination without abnormal findings: Secondary | ICD-10-CM | POA: Diagnosis not present

## 2023-07-25 DIAGNOSIS — N1831 Chronic kidney disease, stage 3a: Secondary | ICD-10-CM | POA: Diagnosis not present

## 2023-07-25 DIAGNOSIS — F32A Depression, unspecified: Secondary | ICD-10-CM | POA: Diagnosis not present

## 2023-07-25 DIAGNOSIS — K219 Gastro-esophageal reflux disease without esophagitis: Secondary | ICD-10-CM

## 2023-07-25 DIAGNOSIS — K449 Diaphragmatic hernia without obstruction or gangrene: Secondary | ICD-10-CM

## 2023-07-25 DIAGNOSIS — B351 Tinea unguium: Secondary | ICD-10-CM

## 2023-07-25 DIAGNOSIS — G43709 Chronic migraine without aura, not intractable, without status migrainosus: Secondary | ICD-10-CM

## 2023-07-25 DIAGNOSIS — F5104 Psychophysiologic insomnia: Secondary | ICD-10-CM

## 2023-07-25 LAB — COMPREHENSIVE METABOLIC PANEL
ALT: 10 U/L (ref 0–35)
AST: 13 U/L (ref 0–37)
Albumin: 4.3 g/dL (ref 3.5–5.2)
Alkaline Phosphatase: 34 U/L — ABNORMAL LOW (ref 39–117)
BUN: 19 mg/dL (ref 6–23)
CO2: 20 mEq/L (ref 19–32)
Calcium: 9.4 mg/dL (ref 8.4–10.5)
Chloride: 105 mEq/L (ref 96–112)
Creatinine, Ser: 1.02 mg/dL (ref 0.40–1.20)
GFR: 63.79 mL/min (ref >60.00)
Glucose, Bld: 85 mg/dL (ref 70–99)
Potassium: 3.5 mEq/L (ref 3.5–5.1)
Sodium: 135 mEq/L (ref 135–145)
Total Bilirubin: 0.3 mg/dL (ref 0.2–1.2)
Total Protein: 7.6 g/dL (ref 6.0–8.3)

## 2023-07-25 LAB — URINALYSIS, ROUTINE W REFLEX MICROSCOPIC
Bilirubin Urine: NEGATIVE
Ketones, ur: NEGATIVE
Leukocytes,Ua: NEGATIVE
Nitrite: NEGATIVE
Specific Gravity, Urine: >=1.03 — AB (ref 1.000–1.030)
Total Protein, Urine: NEGATIVE
Urine Glucose: NEGATIVE
Urobilinogen, UA: 0.2 (ref 0.0–1.0)
pH: 6 (ref 5.0–8.0)

## 2023-07-25 LAB — CBC WITH DIFFERENTIAL/PLATELET
Basophils Absolute: 0.1 10*3/uL (ref 0.0–0.1)
Basophils Relative: 0.9 % (ref 0.0–3.0)
Eosinophils Absolute: 0.2 10*3/uL (ref 0.0–0.7)
Eosinophils Relative: 1.7 % (ref 0.0–5.0)
HCT: 41.8 % (ref 36.0–46.0)
Hemoglobin: 13.7 g/dL (ref 12.0–15.0)
Lymphocytes Relative: 32.9 % (ref 12.0–46.0)
Lymphs Abs: 3.6 10*3/uL (ref 0.7–4.0)
MCHC: 32.8 g/dL (ref 30.0–36.0)
MCV: 93.3 fl (ref 78.0–100.0)
Monocytes Absolute: 0.9 10*3/uL (ref 0.1–1.0)
Monocytes Relative: 8.5 % (ref 3.0–12.0)
Neutro Abs: 6.1 10*3/uL (ref 1.4–7.7)
Neutrophils Relative %: 56 % (ref 43.0–77.0)
Platelets: 487 10*3/uL — ABNORMAL HIGH (ref 150.0–400.0)
RBC: 4.48 Mil/uL (ref 3.87–5.11)
RDW: 13.2 % (ref 11.5–15.5)
WBC: 11 10*3/uL — ABNORMAL HIGH (ref 4.0–10.5)

## 2023-07-25 LAB — TSH: TSH: 3.75 u[IU]/mL (ref 0.35–5.50)

## 2023-07-25 LAB — LIPID PANEL
Cholesterol: 260 mg/dL — ABNORMAL HIGH (ref 0–200)
HDL: 73.6 mg/dL (ref >39.00)
LDL Cholesterol: 158 mg/dL — ABNORMAL HIGH (ref 0–99)
NonHDL: 186.54
Total CHOL/HDL Ratio: 4
Triglycerides: 145 mg/dL (ref 0.0–149.0)
VLDL: 29 mg/dL (ref 0.0–40.0)

## 2023-07-25 MED ORDER — TRAZODONE HCL 150 MG PO TABS: ORAL_TABLET | ORAL | 1 refills | Status: DC

## 2023-07-25 MED ORDER — CITALOPRAM HYDROBROMIDE 40 MG PO TABS: ORAL_TABLET | ORAL | 1 refills | Status: DC

## 2023-07-25 MED ORDER — BUPROPION HCL ER (XL) 300 MG PO TB24: ORAL_TABLET | ORAL | 2 refills | Status: DC

## 2023-07-25 NOTE — Assessment & Plan Note (Signed)
New Started with COVID ?  UTI-she is having increased urinary frequency-check UA, urine culture Possibly related to interstitial cystitis Rule out UTI Trial of OTC Azo pain relief-Pyridium

## 2023-07-25 NOTE — Assessment & Plan Note (Addendum)
Chronic Somewhat controlled-having frequent urination and bladder spasms and that is waking her up Continue trazodone 150 mg at night

## 2023-07-25 NOTE — Assessment & Plan Note (Addendum)
Chronic Currently taking Lamisil to 50 mg daily  Liver tests normal prior to starting it Recheck liver tests today

## 2023-07-25 NOTE — Assessment & Plan Note (Signed)
Chronic Overall controlled Continue citalopram 40 mg daily and bupropion XL 300 mg daily

## 2023-07-25 NOTE — Assessment & Plan Note (Addendum)
Chronic Not controlled Increased GERD Taking Omeprazole 40 mg daily -increase to 40 mg twice daily until symptoms are controlled and then decrease again as tolerated Can also add Pepcid at nighttime Will follow-up with GI

## 2023-07-25 NOTE — Assessment & Plan Note (Signed)
Chronic Mild Maintain good water intake Avoid NSAIDs We will check CMP, CBC

## 2023-07-25 NOTE — Assessment & Plan Note (Signed)
Chronic Will check lipid panel, CMP Regular exercise, heart healthy diet encouraged Currently lifestyle controlled-continue

## 2023-07-25 NOTE — Assessment & Plan Note (Addendum)
Chronic Improved Controlled Management per neurology-Dr. Everlena Cooper

## 2023-07-25 NOTE — Assessment & Plan Note (Signed)
Acute ? Uti vs IC Check ua, ucx Can try otc AZO for pain relief

## 2023-07-26 LAB — URINE CULTURE

## 2023-07-29 ENCOUNTER — Ambulatory Visit (INDEPENDENT_AMBULATORY_CARE_PROVIDER_SITE_OTHER): Payer: BC Managed Care – PPO | Admitting: Neurology

## 2023-07-29 DIAGNOSIS — M79602 Pain in left arm: Secondary | ICD-10-CM

## 2023-07-29 DIAGNOSIS — R29898 Other symptoms and signs involving the musculoskeletal system: Secondary | ICD-10-CM

## 2023-07-29 NOTE — Procedures (Signed)
  [facility]  [address] [place], [address]  [place], [place] [address] Tel: [phone] Fax: [phone] Test Date:  07/29/2023  Patient: Teresa Campbell DOB: October 11, 1971 Physician: Jacquelyne Balint, MD  Sex: Female Height: 5\' 5"  Ref Phys: Shon Millet, DO  ID#: [id number]   Technician:    History: This is a 52 year old female with left upper limb pain and numbness.  NCV & EMG Findings: Extensive electrodiagnostic evaluation of the left upper limb shows: Left median, ulnar, and radial sensory responses are within normal limits. Left median (APB) and ulnar (ADM) motor responses are within normal limits. There is no evidence of active or chronic motor axon loss changes affecting any of the tested muscles on needle examination. Motor unit configuration and recruitment pattern is within normal limits.  Impression: This is a normal study. Specifically: No electrodiagnostic evidence of a left cervical (C5-T1) motor radiculopathy. No electrodiagnostic evidence of a left median mononeuropathy at or distal to the wrist (ie: carpal tunnel syndrome). Screening studies for left ulnar or radial mononeuropathies are normal.     ___________________________ Jacquelyne Balint, MD    Nerve Conduction Studies Motor Nerve Results    Latency Amplitude F-Lat Segment Distance CV Comment  Site (ms) Norm (mV) Norm (ms)  (cm) (m/s) Norm   Left Median (APB) Motor  Wrist 2.1  < 4.0 6.5  > 6.0        Elbow 7.2 - 6.5 -  Elbow-Wrist 27 53  > 50   Left Ulnar (ADM) Motor  Wrist 1.88  < 3.1 10.4  > 7.0        Bel elbow 4.8 - 9.9 -  Bel elbow-Wrist 19.5 67  > 50   Ab elbow 6.5 - 9.3 -  Ab elbow-Bel elbow 10 59 -    Sensory Sites    Neg Peak Lat Amplitude (O-P) Segment Distance Velocity Comment  Site (ms) Norm (V) Norm  (cm) (ms)   Left Median Sensory  Wrist-Dig II 2.4  < 3.6 41  > 15 Wrist-Dig II 13    Left Radial Sensory  Forearm-Wrist 2.0  < 2.7 42  > 14 Forearm-Wrist 10    Left Ulnar Sensory   Wrist-Dig V 2.3  < 3.1 33  > 10 Wrist-Dig V 11     Electromyography   Side Muscle Ins.Act Fibs Fasc Recrt Amp Dur Poly Activation Comment  Left FDI Nml Nml Nml Nml Nml Nml Nml Nml N/A  Left EIP Nml Nml Nml Nml Nml Nml Nml Nml N/A  Left FPL Nml Nml Nml Nml Nml Nml Nml Nml N/A  Left APB Nml Nml Nml Nml Nml Nml Nml Nml N/A  Left Pronator teres Nml Nml Nml Nml Nml Nml Nml Nml N/A  Left Biceps Nml Nml Nml Nml Nml Nml Nml Nml N/A  Left Triceps Nml Nml Nml Nml Nml Nml Nml Nml N/A  Left Deltoid Nml Nml Nml Nml Nml Nml Nml Nml N/A      Waveforms:  Motor      Sensory

## 2023-07-30 ENCOUNTER — Encounter: Payer: Self-pay | Admitting: Internal Medicine

## 2023-08-05 DIAGNOSIS — K581 Irritable bowel syndrome with constipation: Secondary | ICD-10-CM | POA: Diagnosis not present

## 2023-08-05 DIAGNOSIS — K219 Gastro-esophageal reflux disease without esophagitis: Secondary | ICD-10-CM | POA: Diagnosis not present

## 2023-08-05 DIAGNOSIS — R110 Nausea: Secondary | ICD-10-CM | POA: Diagnosis not present

## 2023-08-05 DIAGNOSIS — R140 Abdominal distension (gaseous): Secondary | ICD-10-CM | POA: Diagnosis not present

## 2023-08-14 ENCOUNTER — Ambulatory Visit: Payer: BC Managed Care – PPO | Admitting: Neurology

## 2023-08-26 ENCOUNTER — Encounter: Payer: Self-pay | Admitting: Internal Medicine

## 2023-08-26 ENCOUNTER — Other Ambulatory Visit: Payer: Self-pay

## 2023-08-26 MED ORDER — CITALOPRAM HYDROBROMIDE 40 MG PO TABS: ORAL_TABLET | ORAL | 1 refills | Status: DC

## 2023-10-02 DIAGNOSIS — Z5181 Encounter for therapeutic drug level monitoring: Secondary | ICD-10-CM | POA: Diagnosis not present

## 2023-10-02 DIAGNOSIS — G894 Chronic pain syndrome: Secondary | ICD-10-CM | POA: Diagnosis not present

## 2023-10-02 DIAGNOSIS — M792 Neuralgia and neuritis, unspecified: Secondary | ICD-10-CM | POA: Diagnosis not present

## 2023-10-02 DIAGNOSIS — Z79899 Other long term (current) drug therapy: Secondary | ICD-10-CM | POA: Diagnosis not present

## 2023-10-02 DIAGNOSIS — M533 Sacrococcygeal disorders, not elsewhere classified: Secondary | ICD-10-CM | POA: Diagnosis not present

## 2023-10-02 DIAGNOSIS — M47812 Spondylosis without myelopathy or radiculopathy, cervical region: Secondary | ICD-10-CM | POA: Diagnosis not present

## 2023-10-14 DIAGNOSIS — M533 Sacrococcygeal disorders, not elsewhere classified: Secondary | ICD-10-CM | POA: Diagnosis not present

## 2023-10-17 DIAGNOSIS — M533 Sacrococcygeal disorders, not elsewhere classified: Secondary | ICD-10-CM | POA: Diagnosis not present

## 2023-11-13 DIAGNOSIS — M47812 Spondylosis without myelopathy or radiculopathy, cervical region: Secondary | ICD-10-CM | POA: Diagnosis not present

## 2023-11-25 ENCOUNTER — Other Ambulatory Visit: Payer: Self-pay | Admitting: Neurology

## 2023-12-06 ENCOUNTER — Other Ambulatory Visit: Payer: Self-pay | Admitting: Neurology

## 2023-12-31 ENCOUNTER — Telehealth: Payer: Self-pay

## 2023-12-31 ENCOUNTER — Other Ambulatory Visit: Payer: Self-pay | Admitting: Neurology

## 2023-12-31 ENCOUNTER — Encounter: Payer: Self-pay | Admitting: Neurology

## 2023-12-31 NOTE — Telephone Encounter (Signed)
PER mychart message from patient, PA needed for Aimovig

## 2024-01-03 ENCOUNTER — Other Ambulatory Visit: Payer: Self-pay | Admitting: Neurology

## 2024-01-03 MED ORDER — AIMOVIG 140 MG/ML ~~LOC~~ SOAJ: 140.0000 mg | SUBCUTANEOUS | 0 refills | Status: DC

## 2024-01-04 ENCOUNTER — Other Ambulatory Visit: Payer: Self-pay | Admitting: Internal Medicine

## 2024-01-10 NOTE — Progress Notes (Unsigned)
NEUROLOGY FOLLOW UP OFFICE NOTE  [name] [id number]  Assessment/Plan:     Migraine with aura, without status migrainosus, not intractable     1.  Migraine prevention:  Aimovig 140mg  every 28 days 3.  Migraine rescue:  Rizatriptan 10mg  for severe migraines; ibuprofen with tizanidine for moderate/cervicogenic headaches.  Zofran ODT for nausea.  4.  Follow up 6 months    Subjective:  Teresa Campbell is a 53 year old right-handed woman with fibromyalgia and depression who follows up for migraines and new left upper extremity pain.   UPDATE: Migraines  Stopped topiramate again about 5 months ago due to side effects.   Has had cervical ablation which was ineffective.  Also needs lumbar ablations.  But cannot get repeat procedures due to her insurance.   Increased headaches over last 2 months due to new insurance and trying to get her medications (missed a month of Aimovig) and procedures approved.   Intensity:  Moderate to severe Duration:  less than hour if takes rizatriptan early enough.  Otherwise several hours. Frequency:  7 to 8 a month on average. Aimovig is approved but won't be covered after she meets her deductible.    For further evaluation of left arm pain, she had a NCV-EMG on 07/29/2023 which was normal.  It has since resolved.     Rescue:  For moderate:  Tizanidine, ibuprofen and ice; For severe:  Rizatriptan Current NSAIDS: Excedrin Migraine Current analgesics: hydrocodone (fibromyalgia), ketamine (chronic pain) Current triptans: Rizatriptan 10 mg Current ergotamine:  none Current anti-emetic:  ondansetron ODT 4mg   Current muscle relaxants:  tizanidine 2mg  PRN (muscle spasms) Current anti-anxiolytic:  none Current sleep aide: Trazodone Current Antihypertensive medications:  none Current Antidepressant medications: citalopram 40 mg daily, bupropion 150 mg Current Anticonvulsant medications: none Current anti-CGRP:  Aimovig 140mg  Current  Vitamins/Herbal/Supplements:  B12 Current Antihistamines/Decongestants:  meclizine Other therapy:  cervical radiofrequency ablations; PT neck; ketamine infusions for fibromyalgia, cold compress, heating pad. Hormone/birth control: NuvaRing   Caffeine:  1 cup of coffee daily Diet:  Drinks water all day.  Eats healthy Exercise:  routine Depression:  yes; Anxiety:  yes Other pain:  fibromyalgia, lumbar radiculopathy Sleep hygiene:  Okay with trazodone and exercise     HISTORY:  Migraines: Onset:  Childhood.  Missed school as a child.  Worse after starting her menstrual cycle. Location:  Bi-frontal Quality:  Pounding Initial Intensity:  Severe.  She denies new headache, thunderclap headache or severe headache that wakes her from sleep. Aura:  Flashing lights/blue colors Prodrome:  none Postdrome:  fatigue Associated symptoms:  Photophobia, phonophobia, nausea, osmophobia, sometimes vomiting.  She denies associated unilateral numbness or weakness. Initial Duration:  2 days with Maxalt (1 hour if taken at onset) Initial Frequency:  2 migraines past 30 days (15-17 a month prior to topiramate) Initial Frequency of abortive medication: 2 days past month Triggers: increased depression Relieving factors:  Ice pack on head and heat to back of neck Activity:  aggravates   Started having left arm pain over the summer 2024.  No preceding neck or arm trauma.  Starts in the armpit that radiates down medial arm to the wrist.  Reaching for the door, carrying objects aggravates it, such as holding a plate or her purse.  Maybe the wrist, hand and forearm may be a little weak.  She has been dropping objects such as bowls.  Followed by pain management for chronic pain.  Prior cervical spine treatments include bilateral C3-6 RFA with relief.  Left  sided neck pain improved after RFA in June.  Prior MRI of cervical spine on 01/06/2016 revealed reversal of cervical lordosis at C4-5 level with mild disc bulges at  C4-5 and C5-6 without spinal stenosis or nerve root impingement.    Past NSAIDS:  Ibuprofen (kidney problems), naproxen, Toradol shot Past analgesics:  Tylenol, Excedrin Past abortive triptans: Zomig, sumatriptan tablet Past abortive ergotamine:  none Past muscle relaxants:  none Past anti-emetic:  none Past antihypertensive medications:  none Past antidepressant medications:  Amitriptyline 25mg  Past anticonvulsant medications:  Topiramate 100 mg twice daily (on higher doses she had hair thinning, weight loss), gabapentin Past anti-CGRP:  Nurtec Past vitamins/Herbal/Supplements:  none Past antihistamines/decongestants:  none Other past therapies:  Botox (effective.  However, she was having trouble getting the Botox due to cost), Gamacor vagus nerve stimulator (abortive treatment, effective), cervical nerve ablations (helpful).   Family history of headache:  Father, paternal grandfather, 2 aunts   Transient Spells: In June 2020, she was in the grocery store when she started feeling dizzy.  She couldn't hear what the lady at check out was saying.  Everything was cloudy and her legs felt they were going to buckle.  She felt hot and diaphoretic.  She sat in her car with the air conditioner blowing, drank a water and ate a honey bun.  She felt better but was fatigued for the rest of the day.  She reports similar events in the past but not as severe since childhood.  She did have another episode the next day as well, less severe.  She drank orange juice and peanut butter crackers and it resolved.  No associated headaches.  She has had other events as well.  When she was around 41, she was at work and she became confused.  She couldn't understand her coworkers and she was lethargic.  She was unable to communicate with them.  She went to the ED where testing was unremarkable.  She had a similar event about a year ago.  She is unsure if associated with headaches because she suffers from so many headaches.   Awake and asleep EEG from 07/01/2019 was normal.  MRI of brain with and without contrast from 07/10/2019 was normal.  She has a spell similar to her prior events in August 2023, but never before as severe.  She wasn't able to sleep the previous night.  She didn't eat breakfast because she had a meeting at work but drank 3 cups of coffee.  On 8/22 at around 11:30 AM, she was at work and sat down at her desk following a meeting when she suddenly developed numbness and tingling in her fingertips and toes with sensation traveling up her body.  She saw brown spots in her vision and also felt lightheaded, like she was going to pass out, so she lowered herself onto the floor.  She became tremulous, shaking all over.  No loss of consciousness.  Her Emergency planning/management officer brought her a cola to drink.  Her pulse was high.  Blood sugar was 74-78.  .  Tremulousness gradually resolved over an hour.  That evening she began having difficulty with dexterity and trouble typing.  She also endorsed chest tightness and anxiety.  Over the next couple of days, she didn't feel right.  She still had some chest discomfort.  During this period, she had severe insomnia and wasn't able to sleep at all.  That has since resolved.  On the morning of the event, she realized that she  had accidentally took an extra bupropion.    PAST MEDICAL HISTORY: Past Medical History:  Diagnosis Date   Colon polyps    Depression    Fibromyalgia    GERD (gastroesophageal reflux disease)    Hemorrhoids    Insomnia    Migraine     MEDICATIONS: Current Outpatient Medications on File Prior to Visit  Medication Sig Dispense Refill   albuterol (VENTOLIN HFA) 108 (90 Base) MCG/ACT inhaler Inhale 2 puffs into the lungs every 6 (six) hours as needed for wheezing or shortness of breath. 8 g 2   buPROPion (WELLBUTRIN XL) 300 MG 24 hr tablet TAKE 1 TABLET(300 MG) BY MOUTH DAILY 90 tablet 2   Cholecalciferol (VITAMIN D3) 25 MCG (1000 UT) CAPS Take 1 capsule (1,000  Units total) by mouth daily. 60 capsule    citalopram (CELEXA) 40 MG tablet TAKE 1 TABLET(40 MG) BY MOUTH DAILY 90 tablet 1   HYDROcodone-acetaminophen (NORCO/VICODIN) 5-325 MG tablet TK 1 T PO Q 8 H PRN P FOR UP TO 30 DAYS  0   NUVARING 0.12-0.015 MG/24HR vaginal ring      omeprazole (PRILOSEC) 40 MG capsule Take 1 capsule (40 mg total) by mouth daily. 90 capsule 3   ondansetron (ZOFRAN-ODT) 4 MG disintegrating tablet DISSOLVE 1 TABLET(4 MG) ON THE TONGUE EVERY 8 HOURS AS NEEDED FOR NAUSEA OR VOMITING 20 tablet 3   tiZANidine (ZANAFLEX) 4 MG tablet Take 1 tablet (4 mg total) by mouth every 6 (six) hours as needed for muscle spasms. 30 tablet 5   traZODone (DESYREL) 150 MG tablet TAKE 1 TABLET BY MOUTH EVERY NIGHT AS DIRECTED 90 tablet 1   terbinafine (LAMISIL) 250 MG tablet TAKE 1 TABLET(250 MG) BY MOUTH DAILY (Patient not taking: Reported on 01/13/2024) 90 tablet 0   No current facility-administered medications on file prior to visit.     ALLERGIES: Allergies  Allergen Reactions   Cymbalta [Duloxetine Hcl]     Ineffective   Lyrica [Pregabalin] Other (See Comments)    Ineffective   Tramadol     Ineffective     FAMILY HISTORY: Family History  Problem Relation Age of Onset   Arthritis Mother    COPD Mother    Depression Mother    Hyperlipidemia Mother    Miscarriages / [place] Mother    Arthritis Father    Depression Father    Heart disease Father    Hyperlipidemia Father    Alcohol abuse Sister    Depression Sister    Drug abuse Sister    Hyperlipidemia Sister    Hypertension Sister    Alcohol abuse Brother    Depression Brother    Drug abuse Brother    Depression Daughter    Alcohol abuse Maternal Grandmother    Depression Maternal Grandmother    Stroke Maternal Grandfather    Arthritis Paternal Grandmother    Diabetes Paternal Grandmother    Stroke Paternal Grandfather    Alcohol abuse Sister    Depression Sister    Drug abuse Sister    Depression Brother        Objective:  Blood pressure 113/75, pulse 88, resp. rate 20, height 5\' 5"  (1.651 m), weight 139 lb (63 kg), SpO2 98%. General: No acute distress.  Patient appears well-groomed.      Shon Millet, DO  CC: Cheryll Cockayne, MD

## 2024-01-13 ENCOUNTER — Other Ambulatory Visit (HOSPITAL_COMMUNITY): Payer: Self-pay

## 2024-01-13 ENCOUNTER — Telehealth: Payer: Self-pay

## 2024-01-13 ENCOUNTER — Encounter: Payer: Self-pay | Admitting: Neurology

## 2024-01-13 ENCOUNTER — Ambulatory Visit (INDEPENDENT_AMBULATORY_CARE_PROVIDER_SITE_OTHER): Payer: 59 | Admitting: Neurology

## 2024-01-13 VITALS — BP 113/75 | HR 88 | Resp 20 | Ht 65.0 in | Wt 139.0 lb

## 2024-01-13 DIAGNOSIS — G43109 Migraine with aura, not intractable, without status migrainosus: Secondary | ICD-10-CM | POA: Diagnosis not present

## 2024-01-13 MED ORDER — AIMOVIG 140 MG/ML ~~LOC~~ SOAJ: 140.0000 mg | SUBCUTANEOUS | 5 refills | Status: DC

## 2024-01-13 MED ORDER — RIZATRIPTAN BENZOATE 10 MG PO TABS: ORAL_TABLET | ORAL | 5 refills | Status: DC

## 2024-01-13 NOTE — Telephone Encounter (Signed)
PA request has been Started. New Encounter created for follow up. For additional info see Pharmacy Prior Auth telephone encounter from 02/03.

## 2024-01-13 NOTE — Telephone Encounter (Signed)
PA request has been Submitted. New Encounter created for follow up. For additional info see Pharmacy Prior Auth telephone encounter from 02/03.

## 2024-01-13 NOTE — Patient Instructions (Signed)
Continue Aimovig Rizatriptan and Zofran as needed Tizanidine Limit use of pain relievers to no more than 2 days out of week to prevent risk of rebound or medication-overuse headache. Keep headache diary Follow up 6 months.

## 2024-01-13 NOTE — Telephone Encounter (Signed)
*  [facility]  Pharmacy Patient Advocate Encounter  Received notification from [facility] that Prior Authorization for Aimovig 140MG /ML auto-injectors  has been APPROVED from 01/13/2024 to 01/12/2025. Ran test claim, Copay is $726.36. This test claim was processed through [facility]- copay amounts may vary at other pharmacies due to pharmacy/plan contracts, or as the patient moves through the different stages of their insurance plan.   PA #/Case ID/Reference #: [id number]   *Patient deductible has not been met-contributing to pricing

## 2024-01-20 ENCOUNTER — Telehealth: Payer: Self-pay | Admitting: Neurology

## 2024-01-20 NOTE — Telephone Encounter (Signed)
 Pharmacy calling for more info on Aimovig  Rx for patient [phone]

## 2024-01-20 NOTE — Telephone Encounter (Signed)
 Advised pharmacy Aimovig  140 mg every 28 days

## 2024-01-26 ENCOUNTER — Other Ambulatory Visit: Payer: Self-pay | Admitting: Neurology

## 2024-01-27 NOTE — Progress Notes (Unsigned)
Subjective:    Patient ID: Teresa Campbell, female    DOB: [birth year], 53 y.o.   MRN: [id number]     HPI Maliha is here for follow up of her chronic medical problems.  Started feeling bad 2 days ago - body aches, fatigue, headaches, nauseous.  A lot of flu going around at work.   Sleep is not as good x 2-3 months  Medications and allergies reviewed with patient and updated if appropriate.  Current Outpatient Medications on File Prior to Visit  Medication Sig Dispense Refill   albuterol (VENTOLIN HFA) 108 (90 Base) MCG/ACT inhaler Inhale 2 puffs into the lungs every 6 (six) hours as needed for wheezing or shortness of breath. 8 g 2   buPROPion (WELLBUTRIN XL) 300 MG 24 hr tablet TAKE 1 TABLET(300 MG) BY MOUTH DAILY 90 tablet 2   Cholecalciferol (VITAMIN D3) 25 MCG (1000 UT) CAPS Take 1 capsule (1,000 Units total) by mouth daily. 60 capsule    citalopram (CELEXA) 40 MG tablet TAKE 1 TABLET(40 MG) BY MOUTH DAILY 90 tablet 1   Erenumab-aooe (AIMOVIG) 140 MG/ML SOAJ Inject 140 mg as directed every 28 (twenty-eight) days. ADMINISTER 1 ML(140MG ) UNDER THE SKIN EVERY 30 DAYS 1 mL 5   HYDROcodone-acetaminophen (NORCO/VICODIN) 5-325 MG tablet TK 1 T PO Q 8 H PRN P FOR UP TO 30 DAYS  0   NUVARING 0.12-0.015 MG/24HR vaginal ring      omeprazole (PRILOSEC) 40 MG capsule Take 1 capsule (40 mg total) by mouth daily. 90 capsule 3   ondansetron (ZOFRAN-ODT) 4 MG disintegrating tablet DISSOLVE 1 TABLET(4 MG) ON THE TONGUE EVERY 8 HOURS AS NEEDED FOR NAUSEA OR VOMITING 20 tablet 3   rizatriptan (MAXALT) 10 MG tablet TAKE 1 TABLET BY MOUTH AT ONSET OF MIGRAINE. MAY REPEAT IN 2 HOURS AS NEEDED 10 tablet 5   tirzepatide (MOUNJARO) 7.5 MG/0.5ML Pen Inject 7.5 mg into the skin once a week.     tiZANidine (ZANAFLEX) 4 MG tablet Take 1 tablet (4 mg total) by mouth every 6 (six) hours as needed for muscle spasms. 30 tablet 5   traZODone (DESYREL) 150 MG tablet TAKE 1 TABLET BY MOUTH EVERY NIGHT AS DIRECTED 90  tablet 1   No current facility-administered medications on file prior to visit.     Review of Systems  Constitutional:  Positive for appetite change (decreased) and fever (2 nights ago).  HENT:  Positive for postnasal drip and sinus pressure. Negative for congestion and sore throat.   Respiratory:  Negative for cough, shortness of breath and wheezing.   Cardiovascular:  Negative for chest pain, palpitations and leg swelling.  Gastrointestinal:  Positive for diarrhea, nausea and vomiting (once yesterday). Negative for abdominal pain.  Musculoskeletal:  Positive for myalgias.  Neurological:  Positive for dizziness and headaches.       Objective:   Vitals:   01/28/24 0746  BP: 114/82  Pulse: 72  Temp: 97.8 F (36.6 C)  SpO2: 97%   BP Readings from Last 3 Encounters:  01/28/24 114/82  01/13/24 113/75  07/25/23 116/76   Wt Readings from Last 3 Encounters:  01/28/24 135 lb (61.2 kg)  01/13/24 139 lb (63 kg)  07/25/23 164 lb 3.2 oz (74.5 kg)   Body mass index is 22.47 kg/m.    Physical Exam Constitutional:      General: She is not in acute distress.    Appearance: Normal appearance. She is not ill-appearing.  HENT:  Head: Normocephalic and atraumatic.     Right Ear: Tympanic membrane, ear canal and external ear normal.     Left Ear: Tympanic membrane, ear canal and external ear normal.     Mouth/Throat:     Mouth: Mucous membranes are moist.     Pharynx: No oropharyngeal exudate or posterior oropharyngeal erythema.  Eyes:     Conjunctiva/sclera: Conjunctivae normal.  Cardiovascular:     Rate and Rhythm: Normal rate and regular rhythm.  Pulmonary:     Effort: Pulmonary effort is normal. No respiratory distress.     Breath sounds: Normal breath sounds. No wheezing or rales.  Musculoskeletal:     Cervical back: Neck supple. No tenderness.  Lymphadenopathy:     Cervical: No cervical adenopathy.  Skin:    General: Skin is warm and dry.  Neurological:      Mental Status: She is alert.        Lab Results  Component Value Date   WBC 11.0 (H) 07/25/2023   HGB 13.7 07/25/2023   HCT 41.8 07/25/2023   PLT 487.0 (H) 07/25/2023   GLUCOSE 85 07/25/2023   CHOL 260 (H) 07/25/2023   TRIG 145.0 07/25/2023   HDL 73.60 07/25/2023   LDLCALC 158 (H) 07/25/2023   ALT 10 07/25/2023   AST 13 07/25/2023   NA 135 07/25/2023   K 3.5 07/25/2023   CL 105 07/25/2023   CREATININE 1.02 07/25/2023   BUN 19 07/25/2023   CO2 20 07/25/2023   TSH 3.75 07/25/2023   HGBA1C 5.2 05/29/2019     Assessment & Plan:    See Problem List for Assessment and Plan of chronic medical problems.

## 2024-01-28 ENCOUNTER — Other Ambulatory Visit: Payer: Self-pay | Admitting: Neurology

## 2024-01-28 ENCOUNTER — Ambulatory Visit (INDEPENDENT_AMBULATORY_CARE_PROVIDER_SITE_OTHER): Payer: 59 | Admitting: Internal Medicine

## 2024-01-28 VITALS — BP 114/82 | HR 72 | Temp 97.8°F | Ht 65.0 in | Wt 135.0 lb

## 2024-01-28 DIAGNOSIS — N1831 Chronic kidney disease, stage 3a: Secondary | ICD-10-CM

## 2024-01-28 DIAGNOSIS — F32A Depression, unspecified: Secondary | ICD-10-CM

## 2024-01-28 DIAGNOSIS — J069 Acute upper respiratory infection, unspecified: Secondary | ICD-10-CM | POA: Diagnosis not present

## 2024-01-28 DIAGNOSIS — F5104 Psychophysiologic insomnia: Secondary | ICD-10-CM

## 2024-01-28 DIAGNOSIS — F419 Anxiety disorder, unspecified: Secondary | ICD-10-CM

## 2024-01-28 LAB — POC INFLUENZA A&B (BINAX/QUICKVUE)
Influenza A, POC: NEGATIVE
Influenza B, POC: NEGATIVE

## 2024-01-28 LAB — POC COVID19 BINAXNOW: SARS Coronavirus 2 Ag: NEGATIVE

## 2024-01-28 NOTE — Assessment & Plan Note (Signed)
Chronic Mild - improved Likely related to nsaid use which she will avoid Maintain good water intake

## 2024-01-28 NOTE — Addendum Note (Signed)
Addended by: Karma Ganja on: 01/28/2024 08:43 AM   Modules accepted: Orders

## 2024-01-28 NOTE — Patient Instructions (Addendum)
     Your flu and covid tests are negative.      Medications changes include :   None, try magnesium glycinate at night       Return in about 6 months (around 07/27/2024) for Physical Exam.

## 2024-01-28 NOTE — Assessment & Plan Note (Signed)
Acute Symptoms likely viral in nature COVID and flu  tests negative symptomatic treatment with over-the-counter cold medications Tylenol/ibuprofen Increase rest and fluids Call if symptoms worsen or do not improve

## 2024-01-28 NOTE — Assessment & Plan Note (Addendum)
Chronic Difficulty falling asleep and staying asleep  Not controlled Has increased pain - has ketamine soon which helps Continue Trazodone 150 mg nightly Start magnesium supplement

## 2024-01-28 NOTE — Assessment & Plan Note (Signed)
Chronic Overall controlled Continue citalopram 40 mg daily and bupropion XL 300 mg daily

## 2024-02-03 ENCOUNTER — Encounter: Payer: Self-pay | Admitting: Internal Medicine

## 2024-03-16 ENCOUNTER — Encounter: Payer: Self-pay | Admitting: Internal Medicine

## 2024-03-16 NOTE — Progress Notes (Unsigned)
    Subjective:    Patient ID: Teresa Campbell, female    DOB: Dec 14, 1970, 53 y.o.   MRN: [id number]      HPI Miela is here for No chief complaint on file.    Weakness, dizziness - has had intermittent dizziness and weakness.  Some she has to leave work to go home and other days she cannot make it into work.  Had another episode two days ago.  She felt dizzy and weak every time she got up to do anything.      Medications and allergies reviewed with patient and updated if appropriate.  Current Outpatient Medications on File Prior to Visit  Medication Sig Dispense Refill   albuterol (VENTOLIN HFA) 108 (90 Base) MCG/ACT inhaler Inhale 2 puffs into the lungs every 6 (six) hours as needed for wheezing or shortness of breath. 8 g 2   buPROPion (WELLBUTRIN XL) 300 MG 24 hr tablet TAKE 1 TABLET(300 MG) BY MOUTH DAILY 90 tablet 2   Cholecalciferol (VITAMIN D3) 25 MCG (1000 UT) CAPS Take 1 capsule (1,000 Units total) by mouth daily. 60 capsule    citalopram (CELEXA) 40 MG tablet TAKE 1 TABLET(40 MG) BY MOUTH DAILY 90 tablet 1   Erenumab-aooe (AIMOVIG) 140 MG/ML SOAJ Inject 140 mg as directed every 28 (twenty-eight) days. ADMINISTER 1 ML(140MG ) UNDER THE SKIN EVERY 30 DAYS 1 mL 5   HYDROcodone-acetaminophen (NORCO/VICODIN) 5-325 MG tablet TK 1 T PO Q 8 H PRN P FOR UP TO 30 DAYS  0   NUVARING 0.12-0.015 MG/24HR vaginal ring      omeprazole (PRILOSEC) 40 MG capsule Take 1 capsule (40 mg total) by mouth daily. 90 capsule 3   ondansetron (ZOFRAN-ODT) 4 MG disintegrating tablet DISSOLVE 1 TABLET(4 MG) ON THE TONGUE EVERY 8 HOURS AS NEEDED FOR NAUSEA OR VOMITING 20 tablet 3   rizatriptan (MAXALT) 10 MG tablet TAKE 1 TABLET BY MOUTH AT ONSET OF MIGRAINE. MAY REPEAT IN 2 HOURS AS NEEDED 10 tablet 5   tirzepatide (MOUNJARO) 7.5 MG/0.5ML Pen Inject 7.5 mg into the skin once a week.     tiZANidine (ZANAFLEX) 4 MG tablet TAKE 1 TABLET(4 MG) BY MOUTH EVERY 6 HOURS AS NEEDED FOR MUSCLE SPASMS 30 tablet 5    traZODone (DESYREL) 150 MG tablet TAKE 1 TABLET BY MOUTH EVERY NIGHT AS DIRECTED 90 tablet 1   No current facility-administered medications on file prior to visit.    Review of Systems     Objective:  There were no vitals filed for this visit. BP Readings from Last 3 Encounters:  01/28/24 114/82  01/13/24 113/75  07/25/23 116/76   Wt Readings from Last 3 Encounters:  01/28/24 135 lb (61.2 kg)  01/13/24 139 lb (63 kg)  07/25/23 164 lb 3.2 oz (74.5 kg)   There is no height or weight on file to calculate BMI.    Physical Exam         Assessment & Plan:    See Problem List for Assessment and Plan of chronic medical problems.

## 2024-03-17 ENCOUNTER — Ambulatory Visit (INDEPENDENT_AMBULATORY_CARE_PROVIDER_SITE_OTHER): Admitting: Internal Medicine

## 2024-03-17 VITALS — BP 112/72 | HR 87 | Temp 98.2°F | Ht 65.0 in | Wt 136.0 lb

## 2024-03-17 DIAGNOSIS — F32A Depression, unspecified: Secondary | ICD-10-CM

## 2024-03-17 DIAGNOSIS — J069 Acute upper respiratory infection, unspecified: Secondary | ICD-10-CM

## 2024-03-17 DIAGNOSIS — F419 Anxiety disorder, unspecified: Secondary | ICD-10-CM

## 2024-03-17 DIAGNOSIS — R42 Dizziness and giddiness: Secondary | ICD-10-CM

## 2024-03-17 NOTE — Patient Instructions (Addendum)
   Increase water intake, wear compression socks daily.  Monitor BP and HR.      Medications changes include :   None      Return if symptoms worsen or fail to improve.

## 2024-03-17 NOTE — Assessment & Plan Note (Signed)
 Chronic Overall controlled During these episodes she does note increased anxiety, but feels her overall anxiety is controlled Continue citalopram 40 mg daily and bupropion XL 300 mg daily

## 2024-03-17 NOTE — Assessment & Plan Note (Signed)
 Acute Symptoms likely viral in nature COVID test here is negative Continue symptomatic treatment with over-the-counter cold medications, Tylenol/ibuprofen Increase rest and fluids Call if symptoms worsen or do not improve

## 2024-03-17 NOTE — Assessment & Plan Note (Signed)
 Has had several episodes of dizziness/lightheadedness associated with severe fatigue, diffuse body pain and severe generalized weakness There does not seem to be a pattern for why or when she has these episodes.  They can last 10 minutes or last a full day She has felt that they are getting worse-lasting longer She has difficulty functioning during some of these episodes if they are severe No obvious cause ?  Fibro flare,?  POTS Would expect blood work to be normal at this time so no blood work today Symptoms have improved-sometimes it takes a couple days for them to improve Stay hydrated Consider wearing compression socks a daily Will continue to monitor If she is able to during an episode she will check her blood pressure, heart rate-will start wearing her Apple Watch regularly

## 2024-03-27 ENCOUNTER — Other Ambulatory Visit: Payer: Self-pay | Admitting: Neurology

## 2024-04-15 ENCOUNTER — Other Ambulatory Visit: Payer: Self-pay | Admitting: Internal Medicine

## 2024-04-15 ENCOUNTER — Encounter: Payer: Self-pay | Admitting: Internal Medicine

## 2024-04-25 ENCOUNTER — Other Ambulatory Visit: Payer: Self-pay | Admitting: Internal Medicine

## 2024-04-27 ENCOUNTER — Other Ambulatory Visit: Payer: Self-pay | Admitting: Internal Medicine

## 2024-04-27 ENCOUNTER — Emergency Department (HOSPITAL_COMMUNITY)

## 2024-04-27 ENCOUNTER — Inpatient Hospital Stay (HOSPITAL_COMMUNITY)
Admission: EM | Admit: 2024-04-27 | Discharge: 2024-04-28 | DRG: 103 | Disposition: A | Discharge status: Home / Self Care | Attending: Emergency Medicine | Admitting: Emergency Medicine

## 2024-04-27 ENCOUNTER — Encounter: Payer: Self-pay | Admitting: Internal Medicine

## 2024-04-27 ENCOUNTER — Inpatient Hospital Stay (HOSPITAL_COMMUNITY)

## 2024-04-27 DIAGNOSIS — Z813 Family history of other psychoactive substance abuse and dependence: Secondary | ICD-10-CM

## 2024-04-27 DIAGNOSIS — Z818 Family history of other mental and behavioral disorders: Secondary | ICD-10-CM

## 2024-04-27 DIAGNOSIS — Z79899 Other long term (current) drug therapy: Secondary | ICD-10-CM

## 2024-04-27 DIAGNOSIS — R531 Weakness: Secondary | ICD-10-CM | POA: Diagnosis present

## 2024-04-27 DIAGNOSIS — Z823 Family history of stroke: Secondary | ICD-10-CM

## 2024-04-27 DIAGNOSIS — Z825 Family history of asthma and other chronic lower respiratory diseases: Secondary | ICD-10-CM

## 2024-04-27 DIAGNOSIS — I10 Essential (primary) hypertension: Secondary | ICD-10-CM | POA: Diagnosis present

## 2024-04-27 DIAGNOSIS — G43109 Migraine with aura, not intractable, without status migrainosus: Secondary | ICD-10-CM | POA: Diagnosis present

## 2024-04-27 DIAGNOSIS — K219 Gastro-esophageal reflux disease without esophagitis: Secondary | ICD-10-CM | POA: Diagnosis present

## 2024-04-27 DIAGNOSIS — G459 Transient cerebral ischemic attack, unspecified: Secondary | ICD-10-CM | POA: Diagnosis not present

## 2024-04-27 DIAGNOSIS — Z833 Family history of diabetes mellitus: Secondary | ICD-10-CM

## 2024-04-27 DIAGNOSIS — E785 Hyperlipidemia, unspecified: Secondary | ICD-10-CM | POA: Diagnosis present

## 2024-04-27 DIAGNOSIS — R200 Anesthesia of skin: Secondary | ICD-10-CM | POA: Diagnosis present

## 2024-04-27 DIAGNOSIS — G458 Other transient cerebral ischemic attacks and related syndromes: Principal | ICD-10-CM | POA: Diagnosis present

## 2024-04-27 DIAGNOSIS — I639 Cerebral infarction, unspecified: Secondary | ICD-10-CM | POA: Diagnosis present

## 2024-04-27 DIAGNOSIS — R29701 NIHSS score 1: Secondary | ICD-10-CM | POA: Diagnosis present

## 2024-04-27 DIAGNOSIS — G8194 Hemiplegia, unspecified affecting left nondominant side: Secondary | ICD-10-CM | POA: Diagnosis present

## 2024-04-27 DIAGNOSIS — Z8673 Personal history of transient ischemic attack (TIA), and cerebral infarction without residual deficits: Secondary | ICD-10-CM | POA: Diagnosis present

## 2024-04-27 DIAGNOSIS — M797 Fibromyalgia: Secondary | ICD-10-CM | POA: Diagnosis present

## 2024-04-27 DIAGNOSIS — G43409 Hemiplegic migraine, not intractable, without status migrainosus: Secondary | ICD-10-CM | POA: Diagnosis present

## 2024-04-27 DIAGNOSIS — Z860100 Personal history of colon polyps, unspecified: Secondary | ICD-10-CM

## 2024-04-27 DIAGNOSIS — Z8249 Family history of ischemic heart disease and other diseases of the circulatory system: Secondary | ICD-10-CM | POA: Diagnosis not present

## 2024-04-27 DIAGNOSIS — F32A Depression, unspecified: Secondary | ICD-10-CM | POA: Diagnosis present

## 2024-04-27 DIAGNOSIS — I6381 Other cerebral infarction due to occlusion or stenosis of small artery: Secondary | ICD-10-CM

## 2024-04-27 DIAGNOSIS — I959 Hypotension, unspecified: Secondary | ICD-10-CM | POA: Diagnosis not present

## 2024-04-27 DIAGNOSIS — Z8261 Family history of arthritis: Secondary | ICD-10-CM

## 2024-04-27 DIAGNOSIS — R29702 NIHSS score 2: Secondary | ICD-10-CM | POA: Diagnosis present

## 2024-04-27 DIAGNOSIS — Z8616 Personal history of COVID-19: Secondary | ICD-10-CM | POA: Diagnosis not present

## 2024-04-27 DIAGNOSIS — R0689 Other abnormalities of breathing: Secondary | ICD-10-CM | POA: Diagnosis not present

## 2024-04-27 DIAGNOSIS — Z87891 Personal history of nicotine dependence: Secondary | ICD-10-CM

## 2024-04-27 DIAGNOSIS — Z79890 Hormone replacement therapy (postmenopausal): Secondary | ICD-10-CM | POA: Diagnosis not present

## 2024-04-27 DIAGNOSIS — Z811 Family history of alcohol abuse and dependence: Secondary | ICD-10-CM

## 2024-04-27 DIAGNOSIS — Z83438 Family history of other disorder of lipoprotein metabolism and other lipidemia: Secondary | ICD-10-CM

## 2024-04-27 DIAGNOSIS — F419 Anxiety disorder, unspecified: Secondary | ICD-10-CM | POA: Diagnosis present

## 2024-04-27 DIAGNOSIS — Z7401 Bed confinement status: Secondary | ICD-10-CM | POA: Diagnosis not present

## 2024-04-27 LAB — CBC
HCT: 39.9 % (ref 36.0–46.0)
Hemoglobin: 13 g/dL (ref 12.0–15.0)
MCH: 31.5 pg (ref 26.0–34.0)
MCHC: 32.6 g/dL (ref 30.0–36.0)
MCV: 96.6 fL (ref 80.0–100.0)
Platelets: 361 10*3/uL (ref 150–400)
RBC: 4.13 MIL/uL (ref 3.87–5.11)
RDW: 12.4 % (ref 11.5–15.5)
WBC: 10.8 10*3/uL — ABNORMAL HIGH (ref 4.0–10.5)
nRBC: 0 % (ref 0.0–0.2)

## 2024-04-27 LAB — PROTIME-INR
INR: 1 (ref 0.8–1.2)
Prothrombin Time: 13 s (ref 11.4–15.2)

## 2024-04-27 LAB — RAPID URINE DRUG SCREEN, HOSP PERFORMED
Amphetamines: NOT DETECTED
Barbiturates: NOT DETECTED
Benzodiazepines: NOT DETECTED
Cocaine: NOT DETECTED
Opiates: NOT DETECTED
Tetrahydrocannabinol: NOT DETECTED

## 2024-04-27 LAB — DIFFERENTIAL
Abs Immature Granulocytes: 0.03 10*3/uL (ref 0.00–0.07)
Basophils Absolute: 0.1 10*3/uL (ref 0.0–0.1)
Basophils Relative: 1 %
Eosinophils Absolute: 0.1 10*3/uL (ref 0.0–0.5)
Eosinophils Relative: 1 %
Immature Granulocytes: 0 %
Lymphocytes Relative: 39 %
Lymphs Abs: 4.2 10*3/uL — ABNORMAL HIGH (ref 0.7–4.0)
Monocytes Absolute: 1.1 10*3/uL — ABNORMAL HIGH (ref 0.1–1.0)
Monocytes Relative: 11 %
Neutro Abs: 5.3 10*3/uL (ref 1.7–7.7)
Neutrophils Relative %: 48 %

## 2024-04-27 LAB — COMPREHENSIVE METABOLIC PANEL WITH GFR
ALT: 10 U/L (ref 0–44)
AST: 19 U/L (ref 15–41)
Albumin: 3.8 g/dL (ref 3.5–5.0)
Alkaline Phosphatase: 37 U/L — ABNORMAL LOW (ref 38–126)
Anion gap: 8 (ref 5–15)
BUN: 16 mg/dL (ref 6–20)
CO2: 23 mmol/L (ref 22–32)
Calcium: 9.2 mg/dL (ref 8.9–10.3)
Chloride: 107 mmol/L (ref 98–111)
Creatinine, Ser: 0.97 mg/dL (ref 0.44–1.00)
GFR, Estimated: >60 mL/min (ref >60)
Glucose, Bld: 81 mg/dL (ref 70–99)
Potassium: 3.7 mmol/L (ref 3.5–5.1)
Sodium: 138 mmol/L (ref 135–145)
Total Bilirubin: 0.4 mg/dL (ref 0.0–1.2)
Total Protein: 7 g/dL (ref 6.5–8.1)

## 2024-04-27 LAB — APTT: aPTT: 25 s (ref 24–36)

## 2024-04-27 LAB — CBG MONITORING, ED: Glucose-Capillary: 119 mg/dL — ABNORMAL HIGH (ref 70–99)

## 2024-04-27 LAB — ETHANOL: Alcohol, Ethyl (B): <15 mg/dL (ref <15)

## 2024-04-27 LAB — HCG, SERUM, QUALITATIVE: Preg, Serum: NEGATIVE

## 2024-04-27 LAB — MRSA NEXT GEN BY PCR, NASAL: MRSA by PCR Next Gen: DETECTED — AB

## 2024-04-27 MED ORDER — TRAZODONE HCL 50 MG PO TABS: 50.0000 mg | ORAL_TABLET | Freq: Every evening | ORAL | Status: DC | PRN

## 2024-04-27 MED ORDER — PANTOPRAZOLE SODIUM 40 MG PO TBEC
40.0000 mg | DELAYED_RELEASE_TABLET | Freq: Every day | ORAL | Status: DC
  Administered 2024-04-28: 40 mg via ORAL
  Filled 2024-04-27: qty 1

## 2024-04-27 MED ORDER — ACETAMINOPHEN 650 MG RE SUPP: 650.0000 mg | RECTAL | Status: DC | PRN

## 2024-04-27 MED ORDER — LABETALOL HCL 5 MG/ML IV SOLN: 10.0000 mg | INTRAVENOUS | Status: DC | PRN

## 2024-04-27 MED ORDER — BUPROPION HCL ER (XL) 300 MG PO TB24
300.0000 mg | ORAL_TABLET | Freq: Every day | ORAL | Status: DC
  Administered 2024-04-28: 300 mg via ORAL
  Filled 2024-04-27 (×2): qty 1

## 2024-04-27 MED ORDER — SENNOSIDES-DOCUSATE SODIUM 8.6-50 MG PO TABS: 1.0000 | ORAL_TABLET | Freq: Every evening | ORAL | Status: DC | PRN

## 2024-04-27 MED ORDER — HYDROCODONE-ACETAMINOPHEN 5-325 MG PO TABS
1.0000 | ORAL_TABLET | Freq: Three times a day (TID) | ORAL | Status: DC | PRN
  Administered 2024-04-27 – 2024-04-28 (×2): 1 via ORAL
  Filled 2024-04-27 (×2): qty 1

## 2024-04-27 MED ORDER — CITALOPRAM HYDROBROMIDE 10 MG PO TABS
40.0000 mg | ORAL_TABLET | Freq: Every day | ORAL | Status: DC
  Administered 2024-04-27 – 2024-04-28 (×2): 40 mg via ORAL
  Filled 2024-04-27 (×2): qty 4

## 2024-04-27 MED ORDER — ALBUTEROL SULFATE (2.5 MG/3ML) 0.083% IN NEBU: 2.5000 mg | INHALATION_SOLUTION | Freq: Four times a day (QID) | RESPIRATORY_TRACT | Status: DC | PRN

## 2024-04-27 MED ORDER — TENECTEPLASE FOR STROKE
PACK | INTRAVENOUS | Status: AC
  Administered 2024-04-27: 16 mg via INTRAVENOUS
  Filled 2024-04-27: qty 10

## 2024-04-27 MED ORDER — TENECTEPLASE FOR STROKE
16.0000 mg | PACK | Freq: Once | INTRAVENOUS | Status: AC
  Filled 2024-04-27: qty 3.2

## 2024-04-27 MED ORDER — CHLORHEXIDINE GLUCONATE CLOTH 2 % EX PADS
6.0000 | MEDICATED_PAD | Freq: Every day | CUTANEOUS | Status: DC
  Administered 2024-04-27 – 2024-04-28 (×2): 6 via TOPICAL

## 2024-04-27 MED ORDER — TIZANIDINE HCL 4 MG PO TABS: 4.0000 mg | ORAL_TABLET | Freq: Three times a day (TID) | ORAL | Status: DC | PRN

## 2024-04-27 MED ORDER — ACETAMINOPHEN 325 MG PO TABS
650.0000 mg | ORAL_TABLET | ORAL | Status: DC | PRN
  Administered 2024-04-28: 650 mg via ORAL
  Filled 2024-04-27: qty 2

## 2024-04-27 MED ORDER — STROKE: EARLY STAGES OF RECOVERY BOOK
Freq: Once | Status: AC
  Filled 2024-04-27: qty 1

## 2024-04-27 MED ORDER — HYDRALAZINE HCL 20 MG/ML IJ SOLN: 5.0000 mg | INTRAMUSCULAR | Status: DC | PRN

## 2024-04-27 MED ORDER — VITAMIN D 25 MCG (1000 UNIT) PO TABS
1000.0000 [IU] | ORAL_TABLET | Freq: Every day | ORAL | Status: DC
  Administered 2024-04-27 – 2024-04-28 (×2): 1000 [IU] via ORAL
  Filled 2024-04-27 (×2): qty 1

## 2024-04-27 MED ORDER — TRAZODONE HCL 50 MG PO TABS
150.0000 mg | ORAL_TABLET | Freq: Every evening | ORAL | Status: DC | PRN
  Administered 2024-04-27: 150 mg via ORAL
  Filled 2024-04-27: qty 3

## 2024-04-27 MED ORDER — ACETAMINOPHEN 160 MG/5ML PO SOLN: 650.0000 mg | ORAL | Status: DC | PRN

## 2024-04-27 NOTE — Progress Notes (Signed)
 PHARMACIST CODE STROKE RESPONSE  Notified to mix TNK at 1636 by Dr. Janett Medin (entered in Epic by provider at that time) TNK preparation completed at 1649  TNK dose = 16 mg IV over 5 seconds  TNK dose administered at 1651   Issues/delays encountered (if applicable): Janett Medin had asked for 2nd IV site which nurse was working on then [employer] said ok to use 1st site for TNKase  push.  Delpha Fickle, PharmD, BCPS 04/27/24 4:55 PM

## 2024-04-27 NOTE — Progress Notes (Signed)
 Code stroke cart activated at 1551. Teleneurologist paged at 1552. Dr. Janett Medin on screen at 1554. Pt to CT at 1555. Wanetta Guthrie, Tele Stroke RN

## 2024-04-27 NOTE — ED Notes (Signed)
 Nursing report Called to 4N RN, [facility] also called.

## 2024-04-27 NOTE — ED Notes (Addendum)
Code stroke activated ?

## 2024-04-27 NOTE — H&P (Signed)
 NEUROLOGY H&P NOTE   Date of service: Apr 27, 2024 Patient Name: Teresa Campbell MRN:  [id number] DOB:  07-10-1971 Chief Complaint: "Left sided weakness and numbness"  History of Present Illness  Deaven Barron is a 53 y.o. female with hx of migraine, fibromyalgias, depression, colon polyps, GERD, who presented to [facility] with L sided numbness/tingling involving left arm and leg along with headache.  Patient reports that she was at work in the afternoon.  She had a headache earlier and took some rizatriptan .  Closer to around 1:40 PM, she felt that she had difficulty focusing and felt weak all over.  She called in a coworker to come in and take a look and felt like she was having a stroke.  She felt very tingly in her left foot and in her left hand in addition, was weak on the left side.  EMS called and upon EMS arrival, she was noted to have difficulty with getting up, she felt weak on the left side.  She was brought into [facility] where a code stroke was activated.  She was eval by teleneurology and was given TNKase .  Patient was subsequently transferred to [facility] neuro ICU for post TNK monitoring.  Patient denies any prior history of strokes.  She reports multiple family members with history of strokes.  Patient does not smoke, she does not drink alcohol, she does not use any recreational substances.  Patient regularly follows up with her doctors and denies any history of diabetes or hypertension or hyperlipidemia.  Patient endorses a history of migraine and follows with Dr. Janne Members.  She denies any prior similar symptoms in the setting of migrainous headache.  Last known well: 1345 Modified rankin score: 0-Completely asymptomatic and back to baseline post- stroke IV Thrombolysis: Yes. Thrombectomy: Not offered, symptoms are not consistent with an LVO.  NIHSS components Score: Comment  1a Level of Conscious 0[x]  1[]  2[]  3[]      1b LOC Questions 0[x]  1[]  2[]       1c LOC Commands 0[x]  1[]   2[]       2 Best Gaze 0[x]  1[]  2[]       3 Visual 0[x]  1[]  2[]  3[]      4 Facial Palsy 0[x]  1[]  2[]  3[]      5a Motor Arm - left 0[x]  1[]  2[]  3[]  4[]  UN[]    5b Motor Arm - Right 0[x]  1[]  2[]  3[]  4[]  UN[]    6a Motor Leg - Left 0[x]  1[]  2[]  3[]  4[]  UN[]    6b Motor Leg - Right 0[x]  1[]  2[]  3[]  4[]  UN[]    7 Limb Ataxia 0[x]  1[]  2[]  UN[]      8 Sensory 0[]  1[x]  2[]  UN[]      9 Best Language 0[x]  1[]  2[]  3[]      10 Dysarthria 0[x]  1[]  2[]  UN[]      11 Extinct. and Inattention 0[x]  1[]  2[]       TOTAL: 1      ROS  Comprehensive ROS performed and pertinent positives documented in the HPI  Past History   Past Medical History:  Diagnosis Date   Colon polyps    Depression    Fibromyalgia    GERD (gastroesophageal reflux disease)    Hemorrhoids    Insomnia    Migraine    No past surgical history on file. Family History  Problem Relation Age of Onset   Arthritis Mother    COPD Mother    Depression Mother    Hyperlipidemia Mother    Miscarriages / [place] Mother  Arthritis Father    Depression Father    Heart disease Father    Hyperlipidemia Father    Alcohol abuse Sister    Depression Sister    Drug abuse Sister    Hyperlipidemia Sister    Hypertension Sister    Alcohol abuse Brother    Depression Brother    Drug abuse Brother    Depression Daughter    Alcohol abuse Maternal Grandmother    Depression Maternal Grandmother    Stroke Maternal Grandfather    Arthritis Paternal Grandmother    Diabetes Paternal Grandmother    Stroke Paternal Grandfather    Alcohol abuse Sister    Depression Sister    Drug abuse Sister    Depression Brother    Social History   Socioeconomic History   Marital status: Married    Spouse name: Susana Enter   Number of children: 1   Years of education: Not on file   Highest education level: Bachelor's degree (e.g., BA, AB, BS)  Occupational History   Occupation: Risk manager: [employer]  Tobacco Use   Smoking  status: Former   Smokeless tobacco: Never  Advertising account planner   Vaping status: Never Used  Substance and Sexual Activity   Alcohol use: Yes   Drug use: Not on file   Sexual activity: Yes    Birth control/protection: Inserts  Other Topics Concern   Not on file  Social History Narrative   Patient is right-handed. She lives with her husband in a one level home with a basement. She drinks one cup of coffee a day. She goes to [employer] daily.   Social Drivers of Corporate investment banker Strain: Low Risk  (02/12/2024)   Received from [facility]   Overall Financial Resource Strain (CARDIA)    Difficulty of Paying Living Expenses: Not hard at all  Food Insecurity: No Food Insecurity (02/12/2024)   Received from [facility]   Hunger Vital Sign    Worried About Running Out of Food in the Last Year: Never true    Ran Out of Food in the Last Year: Never true  Transportation Needs: No Transportation Needs (02/12/2024)   Received from [facility] - Transportation    Lack of Transportation (Medical): No    Lack of Transportation (Non-Medical): No  Physical Activity: Unknown (02/12/2024)   Received from [facility]   Exercise Vital Sign    Days of Exercise per Week: 0 days    Minutes of Exercise per Session: Not on file  Stress: No Stress Concern Present (02/12/2024)   Received from [facility] of Occupational Health - Occupational Stress Questionnaire    Feeling of Stress : Only a little  Recent Concern: Stress - Stress Concern Present (01/27/2024)   [employer] of Occupational Health - Occupational Stress Questionnaire    Feeling of Stress : To some extent  Social Connections: Socially Integrated (02/12/2024)   Received from [facility]   Social Network    How would you rate your social network (family, work, friends)?: Good participation with social networks  Recent Concern: Social Connections - Moderately Isolated (01/27/2024)   Social Connection  and Isolation Panel [NHANES]    Frequency of Communication with Friends and Family: More than three times a week    Frequency of Social Gatherings with Friends and Family: Once a week    Attends Religious Services: Never    Database administrator or Organizations:  No    Attends Banker Meetings: Not on file    Marital Status: Married   Allergies  Allergen Reactions   Cymbalta [Duloxetine Hcl]     Ineffective   Lyrica [Pregabalin] Other (See Comments)    Ineffective   Tramadol     Ineffective     Medications   Current Facility-Administered Medications:    Chlorhexidine  Gluconate Cloth 2 % PADS 6 each, 6 each, Topical, Daily, Maggy Wyble, MD   hydrALAZINE  (APRESOLINE ) injection 5 mg, 5 mg, Intravenous, Q4H PRN, Bhagat, Srishti L, MD   labetalol  (NORMODYNE ) injection 10 mg, 10 mg, Intravenous, Q2H PRN, Bhagat, Srishti L, MD   Vitals   Vitals:   04/27/24 1930 04/27/24 1945 04/27/24 2000 04/27/24 2015  BP: 124/64 124/66 121/63 117/63  Pulse: 71 66 68 70  Resp: 17 13 11 14   Temp:    98.3 F (36.8 C)  TempSrc:    Oral  SpO2: 99% 98% 98% 97%  Weight:      Height:         Body mass index is 23.28 kg/m.  Physical Exam   General: Laying comfortably in bed; in no acute distress.  HENT: Normal oropharynx and mucosa. Normal external appearance of ears and nose.  Neck: Supple, no pain or tenderness  CV: No JVD. No peripheral edema.  Pulmonary: Symmetric Chest rise. Normal respiratory effort.  Abdomen: Soft to touch, non-tender.  Ext: No cyanosis, edema, or deformity  Skin: No rash. Normal palpation of skin.   Musculoskeletal: Normal digits and nails by inspection. No clubbing.   Neurologic Examination  Mental status/Cognition: Alert, oriented to self, place, month and year, good attention.  Speech/language: Fluent, comprehension intact, object naming intact, repetition intact.  Cranial nerves:   CN II Pupils equal and reactive to light, no VF deficits     CN III,IV,VI EOM intact, no gaze preference or deviation, no nystagmus    CN V normal sensation in V1, V2, and V3 segments bilaterally    CN VII no asymmetry, no nasolabial fold flattening    CN VIII normal hearing to speech    CN IX & X normal palatal elevation, no uvular deviation    CN XI 5/5 head turn and 5/5 shoulder shrug bilaterally    CN XII midline tongue protrusion    Motor:  Muscle bulk: normal, tone normal, pronator drift none tremor none Mvmt Root Nerve  Muscle Right Left Comments  SA C5/6 Ax Deltoid 5 5   EF C5/6 Mc Biceps 5 5   EE C6/7/8 Rad Triceps 5 5   WF C6/7 Med FCR     WE C7/8 PIN ECU     F Ab C8/T1 U ADM/FDI 5 5   HF L1/2/3 Fem Illopsoas 5 4+   KE L2/3/4 Fem Quad 5 5   DF L4/5 D Peron Tib Ant 5 5   PF S1/2 Tibial Grc/Sol 5 5    Sensation:  Light touch Intact throughout   Pin prick    Temperature    Vibration   Proprioception    Coordination/Complex Motor:  - Finger to Nose intact BL - Heel to shin intact BL - Rapid alternating movement are normal - Gait: deferred.   Labs   CBC:  Recent Labs  Lab 04/27/24 1721  WBC 10.8*  NEUTROABS 5.3  HGB 13.0  HCT 39.9  MCV 96.6  PLT 361   Basic Metabolic Panel:  Lab Results  Component Value Date   NA 138  04/27/2024   K 3.7 04/27/2024   CO2 23 04/27/2024   GLUCOSE 81 04/27/2024   BUN 16 04/27/2024   CREATININE 0.97 04/27/2024   CALCIUM 9.2 04/27/2024   GFRNONAA >60 04/27/2024   Lipid Panel:  Lab Results  Component Value Date   LDLCALC 158 (H) 07/25/2023   HgbA1c:  Lab Results  Component Value Date   HGBA1C 5.2 05/29/2019   Urine Drug Screen:     Component Value Date/Time   LABOPIA NONE DETECTED 04/27/2024 2003   COCAINSCRNUR NONE DETECTED 04/27/2024 2003   LABBENZ NONE DETECTED 04/27/2024 2003   AMPHETMU NONE DETECTED 04/27/2024 2003   THCU NONE DETECTED 04/27/2024 2003   LABBARB NONE DETECTED 04/27/2024 2003    Alcohol Level     Component Value Date/Time   ETH <15  04/27/2024 1722   INR  Lab Results  Component Value Date   INR 1.0 04/27/2024   APTT  Lab Results  Component Value Date   APTT 25 04/27/2024     CT Head without contrast(Personally reviewed): CTH was negative for a large hypodensity concerning for a large territory infarct or hyperdensity concerning for an ICH  CT angio Head and Neck with contrast: Pending  MRI Brain: Pending  Assessment   Shanavia Makela is a 53 y.o. female with hx of migraine, fibromyalgias, depression, colon polyps, GERD, who presented to [facility] with L sided numbness/tingling involving left arm and leg along with headache.  She was given TNKase  for disabling deficit and transferred to [facility] neuro ICU for post TNKase  monitoring.  Impression: I suspect the patient either had a stroke versus hemiplegic migraine. Recommendations  Plan: - Frequent NeuroChecks for post tPA care per stroke unit protocol: - Initial CTH demonstrated no acute hemorrhage or mass - MRI Brain - pending - CTA - pending - TTE - pending, - Lipid Panel: LDL - pending.  - Statin: if LDL > 70 - HbA1c: pending. - Antithrombotic: Start ASA 81 mg daily if 24 h CTH does not show acute hemorrhage - DVT prophylaxis: SCDs. Pharmacologic prophylaxis if 24 h CTH does not demonstrate acute hemorrhage - Systolic Blood Pressure goal: < 180 mm Hg - Telemetry monitoring for arrhythmia: 72 hours - Swallow screen - ordered - PT/OT/SLP consults  ______________________________________________________________________  Allergies verified and updated.  CODE STATUS discussed and patient is full code.  Patient would like her husband and her daughter to make medical decisions on her behalf if she is unable to make medical decisions by herself.  This patient is critically ill and at significant risk of neurological worsening, death and care requires constant monitoring of vital signs, hemodynamics,respiratory and cardiac monitoring, neurological assessment,  discussion with family, other specialists and medical decision making of high complexity. I spent 40 minutes of neurocritical care time  in the care of  this patient. This was time spent independent of any time provided by nurse practitioner or PA.  Rube Sanchez [facility] 04/27/2024  11:00 PM  Signed, Faline Langer, MD [facility]

## 2024-04-27 NOTE — ED Notes (Signed)
 Cellphone, computer, another valuables taken husband Asmara Backs

## 2024-04-27 NOTE — ED Provider Notes (Signed)
 [facility] AT [facility] Provider Note  CSN: [id number] Arrival date & time: 04/27/24 1535  Chief Complaint(s) Code Stroke  HPI Teresa Campbell is a 53 y.o. female who is here today for left arm and leg tingling.  Patient reports she was at work, began feeling well.  Said that she felt generalized weakness, and then developed tingling pain in her left hand.  Patient says that all this happened, she began to feel short of breath.  Her symptoms have resolved aside from tingling in her left arm and left leg.   Past Medical History Past Medical History:  Diagnosis Date   Colon polyps    Depression    Fibromyalgia    GERD (gastroesophageal reflux disease)    Hemorrhoids    Insomnia    Migraine    Patient Active Problem List   Diagnosis Date Noted   Urinary frequency 07/25/2023   Bladder spasms 07/25/2023   COVID-19 07/09/2023   Viral upper respiratory tract infection 11/23/2022   Menopausal symptoms 10/23/2022   Dizziness 10/23/2022   Hyperlipidemia 04/04/2022   Onychomycosis 03/28/2021   CKD (chronic kidney disease) stage 3, GFR 30-59 ml/min (HCC) 03/28/2021   IC (interstitial cystitis) 03/28/2021   Fibromyalgia 03/28/2021   History of endometriosis 03/28/2021   IBS (irritable bowel syndrome) 03/28/2021   Chronic back pain 03/28/2021   Chronic migraine without aura without status migrainosus, not intractable 11/03/2018   Anxiety and depression 11/03/2018   Chronic insomnia 11/03/2018   Spondylosis without myelopathy or radiculopathy, thoracic region 02/03/2018   Hiatal hernia with GERD without esophagitis 11/25/2017   Spondylosis without myelopathy or radiculopathy, cervical region 07/03/2016   Neck pain 12/27/2015   Home Medication(s) Prior to Admission medications   Medication Sig Start Date End Date Taking? Authorizing Provider  albuterol  (VENTOLIN  HFA) 108 (90 Base) MCG/ACT inhaler Inhale 2 puffs into the lungs every 6 (six) hours as  needed for wheezing or shortness of breath. 07/09/23   Colene Dauphin, MD  buPROPion  (WELLBUTRIN  XL) 300 MG 24 hr tablet TAKE 1 TABLET(300 MG) BY MOUTH DAILY 04/15/24   Colene Dauphin, MD  Cholecalciferol  (VITAMIN D3) 25 MCG (1000 UT) CAPS Take 1 capsule (1,000 Units total) by mouth daily. 03/28/21   Colene Dauphin, MD  citalopram  (CELEXA ) 40 MG tablet TAKE 1 TABLET(40 MG) BY MOUTH DAILY 08/26/23   Burns, Beckey Bourgeois, MD  Erenumab -aooe (AIMOVIG ) 140 MG/ML SOAJ Inject 140 mg as directed every 28 (twenty-eight) days. ADMINISTER 1 ML(140MG ) UNDER THE SKIN EVERY 30 DAYS 01/13/24   Merriam Abbey, DO  HYDROcodone -acetaminophen  (NORCO/VICODIN) 5-325 MG tablet TK 1 T PO Q 8 H PRN P FOR UP TO 30 DAYS 09/27/18   [provider]  NUVARING 0.12-0.015 MG/24HR vaginal ring  09/26/18   [provider]  omeprazole  (PRILOSEC) 40 MG capsule Take 1 capsule (40 mg total) by mouth daily. 12/15/18   Adra Alanis, FNP  ondansetron  (ZOFRAN -ODT) 4 MG disintegrating tablet DISSOLVE 1 TABLET(4 MG) ON THE TONGUE EVERY 8 HOURS AS NEEDED FOR NAUSEA OR VOMITING 12/06/23   Festus Hubert, Adam R, DO  rizatriptan  (MAXALT ) 10 MG tablet TAKE 1 TABLET BY MOUTH AT ONSET OF MIGRAINE. MAY REPEAT IN 2 HOURS AS NEEDED 03/30/24   Merriam Abbey, DO  tirzepatide [facility]) 7.5 MG/0.5ML Pen Inject 7.5 mg into the skin once a week.    [provider]  tiZANidine  (ZANAFLEX ) 4 MG tablet TAKE 1 TABLET(4 MG) BY MOUTH EVERY 6 HOURS AS  NEEDED FOR MUSCLE SPASMS 01/28/24   Merriam Abbey, DO  traZODone  (DESYREL ) 150 MG tablet TAKE 1 TABLET BY MOUTH EVERY NIGHT AS DIRECTED. MAY INCREASE TO 2 TABLETS EVERY NIGHT AS DIRECTED 04/27/24   Colene Dauphin, MD                                                                                                                                    Past Surgical History No past surgical history on file. Family History Family History  Problem Relation Age of Onset   Arthritis Mother    COPD Mother     Depression Mother    Hyperlipidemia Mother    Miscarriages / [place] Mother    Arthritis Father    Depression Father    Heart disease Father    Hyperlipidemia Father    Alcohol abuse Sister    Depression Sister    Drug abuse Sister    Hyperlipidemia Sister    Hypertension Sister    Alcohol abuse Brother    Depression Brother    Drug abuse Brother    Depression Daughter    Alcohol abuse Maternal Grandmother    Depression Maternal Grandmother    Stroke Maternal Grandfather    Arthritis Paternal Grandmother    Diabetes Paternal Grandmother    Stroke Paternal Grandfather    Alcohol abuse Sister    Depression Sister    Drug abuse Sister    Depression Brother     Social History Social History   Tobacco Use   Smoking status: Former   Smokeless tobacco: Never  Vaping Use   Vaping status: Never Used  Substance Use Topics   Alcohol use: Yes   Allergies Cymbalta [duloxetine hcl], Lyrica [pregabalin], and Tramadol  Review of Systems Review of Systems  Physical Exam Vital Signs  I have reviewed the triage vital signs BP 131/84   Pulse 89   Resp 19   Wt 63.5 kg   SpO2 99%   BMI 23.28 kg/m   Physical Exam Vitals reviewed.  HENT:     Head: Normocephalic.  Musculoskeletal:     Cervical back: Normal range of motion.  Neurological:     Mental Status: She is alert.     Cranial Nerves: No cranial nerve deficit.     Coordination: Coordination normal.     Comments: Left-sided numbness, no weakness.  Patient is unable to stand and bear weight on the left leg.     ED Results and Treatments Labs (all labs ordered are listed, but only abnormal results are displayed) Labs Reviewed  CBG MONITORING, ED - Abnormal; Notable for the following components:      Result Value   Glucose-Capillary 119 (*)    All other components within normal limits  PROTIME-INR  APTT  CBC  DIFFERENTIAL  COMPREHENSIVE METABOLIC PANEL WITH GFR  ETHANOL  RAPID URINE DRUG SCREEN, HOSP  PERFORMED  HCG, SERUM, QUALITATIVE  I-STAT CHEM 8, ED                                                                                                                          Radiology CT HEAD CODE STROKE WO CONTRAST Result Date: 04/27/2024 CLINICAL DATA:  Code stroke. Neuro deficit, acute, stroke suspected. Sudden onset of generalized weakness, left upper extremity tingling, and dizziness. EXAM: CT HEAD WITHOUT CONTRAST TECHNIQUE: Contiguous axial images were obtained from the base of the skull through the vertex without intravenous contrast. RADIATION DOSE REDUCTION: This exam was performed according to the departmental dose-optimization program which includes automated exposure control, adjustment of the mA and/or kV according to patient size and/or use of iterative reconstruction technique. COMPARISON:  Head MRI 07/09/2019 FINDINGS: Brain: There is no evidence of an acute infarct, intracranial hemorrhage, mass, midline shift, or extra-axial fluid collection. Cerebral volume is normal. The ventricles are normal in size. Vascular: No hyperdense vessel. Skull: No fracture or suspicious lesion. Sinuses/Orbits: Visualized paranasal sinuses and mastoid air cells are clear. Unremarkable orbits. Other: None. ASPECTS ([place]ta Stroke Program Early CT Score) - Ganglionic level infarction (caudate, lentiform nuclei, internal capsule, insula, M1-M3 cortex): 7 - Supraganglionic infarction (M4-M6 cortex): 3 Total score (0-10 with 10 being normal): 10 These results were communicated to Dr. Janett Medin at 4:06 pm on 04/27/2024 by text page via the [facility] messaging system. IMPRESSION: Negative head CT.  ASPECTS of 10. Electronically Signed   By: Aundra Lee M.D.   On: 04/27/2024 16:07    Pertinent labs & imaging results that were available during my care of the patient were reviewed by me and considered in my medical decision making (see MDM for details).  Medications Ordered in ED Medications  tenecteplase  (TNKASE ) 50 MG  injection for Stroke (has no administration in time range)  tenecteplase  (TNKASE ) injection for Stroke 0.25 mg/kg (has no administration in time range)                                                                                                                                     Procedures .Critical Care  Performed by: Nathanael Baker, DO Authorized by: Nathanael Baker, DO   Critical care provider statement:    Critical care time (minutes):  33   Critical care was necessary to treat or prevent imminent or life-threatening deterioration of the following conditions:  CNS failure or compromise  Critical care was time spent personally by me on the following activities:  Development of treatment plan with patient or surrogate, discussions with consultants, evaluation of patient's response to treatment, examination of patient, ordering and review of laboratory studies, ordering and review of radiographic studies, ordering and performing treatments and interventions, pulse oximetry, re-evaluation of patient's condition and review of old charts   (including critical care time)  Medical Decision Making / ED Course   This patient presents to the ED for concern of left-sided numbness, this involves an extensive number of treatment options, and is a complaint that carries with it a high risk of complications and morbidity.  The differential diagnosis includes CVA, anxiety.  MDM: Patient patient's symptoms, I do have concern for CVA.  Activated patient as a code stroke.  Neurology evaluated the patient, were concerned by her symptoms particularly her inability to stand up.  They made the decision to provide patient with TNK.  Patient will require admission to neuro ICU.  Her head CT was negative.   Lab Tests: -I ordered, reviewed, and interpreted labs.   The pertinent results include:   Labs Reviewed  CBG MONITORING, ED - Abnormal; Notable for the following components:      Result Value    Glucose-Capillary 119 (*)    All other components within normal limits  PROTIME-INR  APTT  CBC  DIFFERENTIAL  COMPREHENSIVE METABOLIC PANEL WITH GFR  ETHANOL  RAPID URINE DRUG SCREEN, HOSP PERFORMED  HCG, SERUM, QUALITATIVE  I-STAT CHEM 8, ED        Imaging Studies ordered: I ordered imaging studies including CT head I independently visualized and interpreted imaging. I agree with the radiologist interpretation   Medicines ordered and prescription drug management: Meds ordered this encounter  Medications   tenecteplase  (TNKASE ) 50 MG injection for Stroke    Toniann Franklin D: cabinet override   tenecteplase  (TNKASE ) injection for Stroke 0.25 mg/kg    -I have reviewed the patients home medicines and have made adjustments as needed  Critical interventions Management of CVA, receiving lytics   Cardiac Monitoring: The patient was maintained on a cardiac monitor.  I personally viewed and interpreted the cardiac monitored which showed an underlying rhythm of: Normal sinus rhythm   Reevaluation: After the interventions noted above, I reevaluated the patient and found that they have :improved  Co morbidities that complicate the patient evaluation  Past Medical History:  Diagnosis Date   Colon polyps    Depression    Fibromyalgia    GERD (gastroesophageal reflux disease)    Hemorrhoids    Insomnia    Migraine       Dispostion: Admission to neuro ICU     Final Clinical Impression(s) / ED Diagnoses Final diagnoses:  Acute left-sided weakness  Left sided numbness     @PCDICTATION @    Afton Horse T, DO 04/27/24 1647

## 2024-04-27 NOTE — ED Notes (Signed)
 CODE STROKE CALLED...KLJ

## 2024-04-27 NOTE — ED Triage Notes (Addendum)
 PT arrives via EMS from her workplace. PT had a sudden onset of generalized weakness, tingling in left hand, pain in her right shoulder, dizziness and sob that started two hours ago. Pt AxOx4. PT reports she has decreased sensation and numbness in left arm. No other focal neurological deficits.   Dr. Florie Husband at bedside evaluating patient.

## 2024-04-27 NOTE — Progress Notes (Signed)
 eLink Physician-Brief Progress Note Patient Name: Teresa Campbell DOB: [birth year] MRN: [id number]   Date of Service  04/27/2024  HPI/Events of Note  53 year old female with a history of gastroesophageal reflux disease, fibromyalgia, and cervical spondylosis that initially presented with left arm and leg tingling that was thought to be having an acute stroke and received tenecteplase .  Admitted to ICU for further management under neurology.  Vital signs are within normal limits.  Saturating 97% on room air.  Results consistent with minor electrolyte disturbances leukocytosis.  Initial CT head unremarkable.  Angiography pending.  eICU Interventions  Stroke restratification labs, echocardiogram, MRI, remaining workup are pending  Standard TNK precautions for 24 hours.  Maintain strict blood pressure under systolic 180.  Maintain home antidepressants  Will eventually need PMNR evaluation  DVT prophylaxis with SCDs with TNK in place GI prophylaxis currently not indicated, home PPI     Intervention Category Evaluation Type: New Patient Evaluation  Ellina Sivertsen 04/27/2024, 9:44 PM

## 2024-04-27 NOTE — Progress Notes (Addendum)
 [facility] Telemedicine Consult   Requesting Provider: Dr Florie Husband Consult Participants: patient, husband and atrium RN Misty and bedside rn Location of the provider: [facility] Location of the patient: [facility]  This consult was provided via telemedicine with 2-way video and audio communication. The patient/family was informed that care would be provided in this way and agreed to receive care in this manner.    Chief Complaint:  left sided numbness and weakness.  HPI: Ms. Eckmann is a 53 year old Caucasian lady with past medical history of migraines, fibromyalgia, depression, colonic polyps, gastroesophageal reflux disease.  She developed sudden onset of not feeling well with tingling involving left hand and left foot as well as headache and shortness of breath.  She initially complained of generalized weakness but after arrival here on examination was found to have left leg weakness.  When she was stood up to get her weight she almost collapsed.  Patient does have history of longstanding migraine headaches and is followed by neurologist Dr. Festus Hubert.  She states she did take 1 dose of rizatriptan  at 12:00 today for a headache but states that at present she is not having a severe headache and she usually does not get any focal neurological deficits with her migraines.  She had an MRI scan of the brain done previously on 07/09/2019 for an episode of headache dizziness and syncope which was negative for any acute abnormality.  LKW: 1345 tnk given?: Yes Delay due to difficulty in establishing iv access needing ultrasound and patient unable to decide and insisted on husband being contacted first and he did not answer his phone immediately IR Thrombectomy? No,  clinical presentation not s/o LVO Modified Rankin Scale: 0-Completely asymptomatic and back to baseline post- stroke Time of teleneurologist evaluation:  1600  Exam: Vitals:   04/27/24 1353 04/27/24 1541  BP:  131/84   Pulse: 89   Resp: 19   SpO2: 98% 99%    General:  Extremely anxious middle-aged Caucasian lady.  She is emotionally upset and quite tearful. She is awake alert interactive.  Oriented to time place and person.  Speech and language appear normal.  No dysarthria aphasia or apraxia.  Extraocular movements are full range without nystagmus.  Face is symmetric without weakness.  Tongue is midline.  Motor system exam shows left lower extremity drift.  Finger-to-nose and eatable coordination accurate.  Subjective diminished sensation lower half of the face on the left as well as left hand and feet.  Finger-to-nose and eatable coordination are slow but accurate.  Gait not tested. 1A: Level of Consciousness - 0 1B: Ask Month and Age - 0 1C: 'Blink Eyes' & 'Squeeze Hands' - 0 2: Test Horizontal Extraocular Movements - 0 3: Test Visual Fields - 0 4: Test Facial Palsy - 0 5A: Test Left Arm Motor Drift - 0 5B: Test Right Arm Motor Drift - 0 6A: Test Left Leg Motor Drift - 1 6B: Test Right Leg Motor Drift - 0 7: Test Limb Ataxia - 0 8: Test Sensation - 1 9: Test Language/Aphasia- 0 10: Test Dysarthria - 0 11: Test Extinction/Inattention - 0 NIHSS score: 2   Imaging Reviewed: CT head noncontrast study personally reviewed shows no acute abnormalities.  Labs reviewed in epic and pertinent values follow: Pending at this time.   Assessment: 53 year old Caucasian lady with sudden onset of left-sided weakness as well as numbness paresthesias possibly from right subcortical thalamic lacunar infarct.  Differential diagnosis also includes complicated migraine given her  longstanding history of migraine with patient denies previous history of focal neurological symptoms with her migraines.  She does also have significant underlying anxiety and stress which could also be contributing.  Recommendations: I had a long discussion with the patient and also spoke to her husband over the phone about her  neurological deficits particularly left leg weakness and discussed risk benefits and alternatives to treatment with IV TNK.  I specifically explained 4 to 6% risk of symptomatic hemorrhage and send patient symptoms are not resolving and she was barely able to stand due to leg weakness I would recommend proceeding with IV TNK administration.  I have personally reviewed inclusion exclusion criteria and patient does not have any obvious exclusion criteria at this time.  She is not on any blood thinners and is not currently having active bleeding and has not had any recent surgeries stroke. Plan to proceed with IV TNK administration and close neurological monitoring and strict blood pressure control as per post TNK protocol. Transfer to [facility] for admission to neurological ICU under stroke MD. Neurohospitalist on-call at [facility] will see patient on admission and put in admission orders. 24-hour post TNK imaging, check lipid profile, hemoglobin A1c, echocardiogram, physical Occupational Therapy and speech therapy consults.  No antiplatelets, anticoagulants or IVs at noncompressible sites for the first 24 hours Long discussion with patient, husband and answered questions.  Discussed with Dr. Florie Husband This patient is critically ill and at significant risk of neurological worsening, death and care requires constant monitoring of vital signs, hemodynamics,respiratory and cardiac monitoring, extensive review of multiple databases, frequent neurological assessment, discussion with family, other specialists and medical decision making of high complexity.I have made any additions or clarifications directly to the above note.This critical care time does not reflect procedure time, or teaching time or supervisory time of PA/NP/Med Resident etc but could involve care discussion time.  I spent 60 minutes of neurocritical care time  in the care of  this patient.        Ardella Beaver MD [facility] [phone]  If 7pm- 7am, please page neurology on call as listed in AMION.

## 2024-04-27 NOTE — ED Notes (Signed)
 Pt vitals are stable. Pt starting to complain of headache . Pt is oriented x4.

## 2024-04-27 NOTE — Progress Notes (Addendum)
 VAST consult. Patient has limited vasculature. Dr. Janett Medin, teleneurologist. Stated "ok to use stroke affected arm" at this time due to need for TNK infusion, for 2nd PIV access. TNK infused through R arm (non-stroke side) Instructed nurse Thurston Flow RN to report to have line removed ASAP once other line placed. Notified RN to request PICC/CVC line d/t poor vasculature.  Thurston Flow RN VU. Theophilus Fitz, RN VAST

## 2024-04-28 ENCOUNTER — Encounter (HOSPITAL_COMMUNITY): Payer: Self-pay | Admitting: Neurology

## 2024-04-28 ENCOUNTER — Inpatient Hospital Stay (HOSPITAL_COMMUNITY)

## 2024-04-28 ENCOUNTER — Other Ambulatory Visit: Payer: Self-pay

## 2024-04-28 ENCOUNTER — Ambulatory Visit: Payer: Self-pay | Admitting: Neurology

## 2024-04-28 DIAGNOSIS — G459 Transient cerebral ischemic attack, unspecified: Secondary | ICD-10-CM | POA: Diagnosis not present

## 2024-04-28 DIAGNOSIS — G43409 Hemiplegic migraine, not intractable, without status migrainosus: Secondary | ICD-10-CM

## 2024-04-28 DIAGNOSIS — G43109 Migraine with aura, not intractable, without status migrainosus: Secondary | ICD-10-CM | POA: Insufficient documentation

## 2024-04-28 LAB — ECHOCARDIOGRAM COMPLETE
AR max vel: 1.92 cm2
AV Area VTI: 1.82 cm2
AV Area mean vel: 1.56 cm2
AV Mean grad: 3 mmHg
AV Peak grad: 6 mmHg
Ao pk vel: 1.22 m/s
Area-P 1/2: 3.13 cm2
Calc EF: 63.5 %
Height: 65 in
MV VTI: 3.18 cm2
S' Lateral: 2.8 cm
Single Plane A2C EF: 69.7 %
Single Plane A4C EF: 58.4 %
Weight: 2238.4 [oz_av]

## 2024-04-28 LAB — LIPID PANEL
Cholesterol: 172 mg/dL (ref 0–200)
HDL: 56 mg/dL (ref >40)
LDL Cholesterol: 100 mg/dL — ABNORMAL HIGH (ref 0–99)
Total CHOL/HDL Ratio: 3.1 ratio
Triglycerides: 79 mg/dL (ref <150)
VLDL: 16 mg/dL (ref 0–40)

## 2024-04-28 LAB — HEMOGLOBIN A1C
Hgb A1c MFr Bld: 4.3 % — ABNORMAL LOW (ref 4.8–5.6)
Mean Plasma Glucose: 76.71 mg/dL

## 2024-04-28 MED ORDER — ATORVASTATIN CALCIUM 80 MG PO TABS: 80.0000 mg | ORAL_TABLET | Freq: Every day | ORAL | 0 refills | Status: DC

## 2024-04-28 MED ORDER — CHLORHEXIDINE GLUCONATE CLOTH 2 % EX PADS
6.0000 | MEDICATED_PAD | Freq: Every day | CUTANEOUS | Status: DC
  Administered 2024-04-28: 6 via TOPICAL

## 2024-04-28 MED ORDER — ORAL CARE MOUTH RINSE: 15.0000 mL | OROMUCOSAL | Status: DC | PRN

## 2024-04-28 MED ORDER — IOHEXOL 350 MG/ML SOLN
75.0000 mL | Freq: Once | INTRAVENOUS | Status: AC | PRN
  Administered 2024-04-28: 75 mL via INTRAVENOUS

## 2024-04-28 MED ORDER — ATORVASTATIN CALCIUM 80 MG PO TABS
80.0000 mg | ORAL_TABLET | Freq: Every day | ORAL | Status: DC
  Filled 2024-04-28: qty 1

## 2024-04-28 MED ORDER — MUPIROCIN 2 % EX OINT
1.0000 | TOPICAL_OINTMENT | Freq: Two times a day (BID) | CUTANEOUS | Status: DC
  Administered 2024-04-28: 1 via NASAL
  Filled 2024-04-28: qty 22

## 2024-04-28 NOTE — Evaluation (Signed)
 Occupational Therapy Evaluation Patient Details Name: Teresa Campbell MRN: [id number] DOB: Sep 23, 1971 Today's Date: 04/28/2024   History of Present Illness   Pt is a 53 y.o. female presenting 5/19 with L sided numbness/tingling, headache. Code stroke activated at [facility] ED; pt now s/p TNK. Transferred to [facility] for monitoring. CTH/CTA head/neck with no emergent finding. PMH significant for migraine, fibromyalgia, depression, colon polyps, GERD     Clinical Impressions PTA, pt lived with husband and was mod I in ADL and IADL, working, and driving. Upon eval, pt requires up to CGA for ADL. Pt presenting with L sided decr coordination and weakness in LUE and LLE. Pt reports her primary role at work is typing at her desk. Performed simulated work task with typing and pt with significant errors and decr speed of coordination noted. Recommending OP OT to optimize coordination and strength to facilitate return to IADL and work.      If plan is discharge home, recommend the following:   A little help with walking and/or transfers;A little help with bathing/dressing/bathroom;Assistance with cooking/housework;Assist for transportation;Help with stairs or ramp for entrance     Functional Status Assessment   Patient has had a recent decline in their functional status and demonstrates the ability to make significant improvements in function in a reasonable and predictable amount of time.     Equipment Recommendations   None recommended by OT     Recommendations for Other Services         Precautions/Restrictions   Precautions Precautions: Fall Restrictions Weight Bearing Restrictions Per Provider Order: No     Mobility Bed Mobility Overal bed mobility: Modified Independent                  Transfers Overall transfer level: Needs assistance Equipment used: None Transfers: Sit to/from Stand Sit to Stand: Contact guard assist                  Balance Overall balance  assessment: Needs assistance Sitting-balance support: Feet supported Sitting balance-Leahy Scale: Good     Standing balance support: No upper extremity supported, During functional activity Standing balance-Leahy Scale: Fair                             ADL either performed or assessed with clinical judgement   ADL Overall ADL's : Needs assistance/impaired Eating/Feeding: Modified independent;Sitting   Grooming: Supervision/safety;Standing   Upper Body Bathing: Sitting;Set up   Lower Body Bathing: Contact guard assist;Sit to/from stand   Upper Body Dressing : Set up;Sitting   Lower Body Dressing: Contact guard assist;Sit to/from stand   Toilet Transfer: Contact guard assist;Ambulation   Toileting- Clothing Manipulation and Hygiene: Supervision/safety;Sitting/lateral lean   Tub/ Shower Transfer: Contact guard assist;Tub transfer;Ambulation   Functional mobility during ADLs: Contact guard assist       Vision Baseline Vision/History: 1 Wears glasses Ability to See in Adequate Light: 0 Adequate Patient Visual Report: No change from baseline Vision Assessment?: No apparent visual deficits     Perception         Praxis         Pertinent Vitals/Pain       Extremity/Trunk Assessment Upper Extremity Assessment Upper Extremity Assessment: Right hand dominant;LUE deficits/detail LUE Deficits / Details: decr speed of coordination, 4/5 strength as compared to 5/5 R LUE Coordination: decreased fine motor   Lower Extremity Assessment Lower Extremity Assessment: Defer to PT evaluation   Cervical / Trunk Assessment  Cervical / Trunk Assessment: Normal   Communication Communication Communication: No apparent difficulties   Cognition Arousal: Alert Behavior During Therapy: WFL for tasks assessed/performed Cognition: No apparent impairments                               Following commands: Intact       Cueing  General Comments   Cueing  Techniques: Verbal cues  worked on typing at keyboard and pt with 70% errors, predominantly with LUE >RUE with twice errors on L handed keys. Pt types as a predominant role in her job   Exercises     Shoulder Instructions      Home Living Family/patient expects to be discharged to:: Private residence Living Arrangements: Spouse/significant other Available Help at Discharge: Family Type of Home: House Home Access: Stairs to enter Secretary/administrator of Steps: 1   Home Layout: Able to live on main level with bedroom/bathroom     Bathroom Shower/Tub: Tub/shower unit (remodeling to walk in shower)   Bathroom Toilet: Handicapped height     Home Equipment: None          Prior Functioning/Environment Prior Level of Function : Independent/Modified Independent             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; 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      OT Problem List: Decreased strength;Decreased activity tolerance;Impaired balance (sitting and/or standing);Decreased coordination;Impaired UE functional use   OT Treatment/Interventions: Self-care/ADL training;Therapeutic exercise;DME and/or AE instruction;Neuromuscular education;Therapeutic activities;Balance training;Patient/family education      OT Goals(Current goals can be found in the care plan section)   Acute Rehab OT Goals Patient Stated Goal: get better and go home OT Goal Formulation: With patient Time For Goal Achievement: 05/12/24 Potential to Achieve Goals: Good   OT Frequency:  Min 2X/week    Co-evaluation              AM-PAC OT "6 Clicks" Daily Activity     Outcome Measure Help from another person eating meals?: None Help from another person taking care of personal grooming?: A Little Help from another person toileting, which includes using toliet, bedpan, or urinal?: A Little Help from another person bathing (including washing, rinsing, drying)?: A Little Help from  another person to put on and taking off regular upper body clothing?: A Little Help from another person to put on and taking off regular lower body clothing?: A Little 6 Click Score: 19   End of Session Equipment Utilized During Treatment: Gait belt Nurse Communication: Mobility status  Activity Tolerance: Patient tolerated treatment well Patient left: in bed;with call bell/phone within reach;with family/visitor present  OT Visit Diagnosis: Unsteadiness on feet (R26.81);Muscle weakness (generalized) (M62.81)                Time: 4696-2952 OT Time Calculation (min): 30 min Charges:  OT General Charges $OT Visit: 1 Visit OT Evaluation $OT Eval Low Complexity: 1 Low OT Treatments $Self Care/Home Management : 8-22 mins  Karilyn Ouch, OTR/L [facility] Acute Rehabilitation Office: [phone]   Emery Hans 04/28/2024, 4:19 PM

## 2024-04-28 NOTE — Evaluation (Signed)
 Physical Therapy Evaluation Patient Details Name: Teresa Campbell MRN: [id number] DOB: 08/30/1971 Today's Date: 04/28/2024  History of Present Illness  Pt is a 53 y.o. female presenting 5/19 with L sided numbness/tingling, headache. Code stroke activated at [facility] ED; pt now s/p TNK. Transferred to [facility] for monitoring. CTH/CTA head/neck with no emergent finding. 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 Pt presents with left lower extremity proximal weakness and impaired gross motor coordination. Pt reports "tingling," and hypersensitivity to lateral dorsum of foot, however is intact to light touch. Pt initially requiring min assist for dynamic balance and RW, however, progressing quickly during session to CGA with no assistive device, gaining confidence with increased distance. Pt overall ambulating a total of ~200 ft and negotiated a step to simulate home environment. Will currently recommend RW for increased postural stability, however, pt will benefit from follow up OPPT to address deficits and maximize functional independence to progress to LRAD.       If plan is discharge home, recommend the following: A little help with walking and/or transfers;A little help with bathing/dressing/bathroom;Assistance with cooking/housework;Assist for transportation;Help with stairs or ramp for entrance   Can travel by private vehicle        Equipment Recommendations Rolling walker (2 wheels)  Recommendations for Other Services       Functional Status Assessment Patient has had a recent decline in their functional status and demonstrates the ability to make significant improvements in function in a reasonable and predictable amount of time.     Precautions / Restrictions Precautions Precautions: Fall Restrictions Weight Bearing Restrictions Per Provider Order: No      Mobility  Bed  Mobility Overal bed mobility: Modified Independent                  Transfers Overall transfer level: Needs assistance Equipment used: None Transfers: Sit to/from Stand Sit to Stand: Contact guard assist                Ambulation/Gait Ambulation/Gait assistance: Min assist, Contact guard assist Gait Distance (Feet): 200 Feet Assistive device: Rolling walker (2 wheels), None (R railing) Gait Pattern/deviations: Step-to pattern, Step-through pattern, Decreased stance time - left, Decreased dorsiflexion - left Gait velocity: decreased     General Gait Details: Pt with decreased gross motor coordination of L side, initially providing visual, verbal and tactile cues for weight shift and L step length. Pt trialed walker first, then progressed to R railing and then no AD. CGA-minA for dynamic balance  Stairs Stairs: Yes Stairs assistance: Min assist Stair Management: One rail Left Number of Stairs: 1 General stair comments: cues for sequencing  Wheelchair Mobility     Tilt Bed    Modified Rankin (Stroke Patients Only) Modified Rankin (Stroke Patients Only) Pre-Morbid Rankin Score: No symptoms Modified Rankin: Moderately severe disability     Balance Overall balance assessment: Needs assistance Sitting-balance support: Feet supported Sitting balance-Leahy Scale: Good     Standing balance support: No upper extremity supported, During functional activity Standing balance-Leahy Scale: Fair                               Pertinent Vitals/Pain Pain Assessment Pain Assessment: No/denies pain    Home Living Family/patient expects to be discharged to:: Private residence Living Arrangements: Spouse/significant other Available Help at Discharge: Family Type of Home:  House Home Access: Stairs to enter   Secretary/administrator of Steps: 1   Home Layout: Able to live on main level with bedroom/bathroom Home Equipment: None      Prior Function  Prior Level of Function : Independent/Modified Independent             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; Dealer for [employer]       Extremity/Trunk Assessment   Upper Extremity Assessment Upper Extremity Assessment: Defer to OT evaluation    Lower Extremity Assessment Lower Extremity Assessment: RLE deficits/detail;LLE deficits/detail RLE Deficits / Details: Strength 5/5 LLE Deficits / Details: L hip flexion 3-/5 otherwise 5/5 LLE Coordination: decreased gross motor    Cervical / Trunk Assessment Cervical / Trunk Assessment: Normal  Communication   Communication Communication: No apparent difficulties    Cognition Arousal: Alert Behavior During Therapy: WFL for tasks assessed/performed   PT - Cognitive impairments: No apparent impairments                         Following commands: Intact       Cueing Cueing Techniques: Verbal cues     General Comments      Exercises     Assessment/Plan    PT Assessment Patient needs continued PT services  PT Problem List Decreased strength;Decreased activity tolerance;Decreased balance;Decreased mobility;Decreased coordination       PT Treatment Interventions DME instruction;Gait training;Stair training;Functional mobility training;Therapeutic activities;Therapeutic exercise;Balance training;Patient/family education    PT Goals (Current goals can be found in the Care Plan section)  Acute Rehab PT Goals Patient Stated Goal: back to baseline PT Goal Formulation: With patient Time For Goal Achievement: 05/12/24 Potential to Achieve Goals: Good    Frequency Min 2X/week     Co-evaluation               AM-PAC PT "6 Clicks" Mobility  Outcome Measure Help needed turning from your back to your side while in a flat bed without using bedrails?: None Help needed moving from lying on your back to sitting on the side of a flat bed without using bedrails?: None Help  needed moving to and from a bed to a chair (including a wheelchair)?: A Little Help needed standing up from a chair using your arms (e.g., wheelchair or bedside chair)?: A Little Help needed to walk in hospital room?: A Little Help needed climbing 3-5 steps with a railing? : A Little 6 Click Score: 20    End of Session Equipment Utilized During Treatment: Gait belt Activity Tolerance: Patient tolerated treatment well Patient left: in bed;with call bell/phone within reach;Other (comment) (for echo) Nurse Communication: Mobility status PT Visit Diagnosis: Unsteadiness on feet (R26.81);Other abnormalities of gait and mobility (R26.89);Difficulty in walking, not elsewhere classified (R26.2)    Time: 8119-1478 PT Time Calculation (min) (ACUTE ONLY): 31 min   Charges:   PT Evaluation $PT Eval Moderate Complexity: 1 Mod PT Treatments $Gait Training: 8-22 mins PT General Charges $$ ACUTE PT VISIT: 1 Visit         Verdia Glad, PT, DPT Acute Rehabilitation Services Office [phone]   Claria Crofts 04/28/2024, 10:21 AM

## 2024-04-28 NOTE — Progress Notes (Signed)
  Echocardiogram 2D Echocardiogram has been performed.  [name] RDCS 04/28/2024, 10:06 AM

## 2024-04-28 NOTE — Progress Notes (Signed)
 PT Cancellation Note  Patient Details Name: Teresa Campbell MRN: [id number] DOB: [birth year]   Cancelled Treatment:    Reason Eval/Treat Not Completed: Active bedrest order  Verdia Glad, PT, DPT Acute Rehabilitation Services Office [phone]    Claria Crofts 04/28/2024, 7:58 AM

## 2024-04-28 NOTE — TOC CAGE-AID Note (Signed)
 Transition of Care [facility]) - CAGE-AID Screening   Patient Details  Name: Day Greb MRN: [id number] Date of Birth: May 09, 1971  [facility] CM/SW Contact:    Kalenna Millett E Kathaleya Mcduffee, LCSW Phone Number: 04/28/2024, 11:40 AM   Clinical Narrative: Patient states she drinks alcohol on special occasions. Does not feel it is a problem. Denied other substance use.   CAGE-AID Screening:    Have You Ever Felt You Ought to Cut Down on Your Drinking or Drug Use?: No Have People Annoyed You By Critizing Your Drinking Or Drug Use?: No Have You Felt Bad Or Guilty About Your Drinking Or Drug Use?: No Have You Ever Had a Drink or Used Drugs First Thing In The Morning to Steady Your Nerves or to Get Rid of a Hangover?: No CAGE-AID Score: 0  Substance Abuse Education Offered: No

## 2024-04-28 NOTE — Discharge Summary (Addendum)
 Stroke Discharge Summary  Patient ID: Laiah Pouncey   MRN: [id number]      DOB: [birth year]  Date of Admission: 04/27/2024 Date of Discharge: 04/28/2024  Attending Physician:  Stroke, Md, MD, Stroke MD Patient's PCP:  Colene Dauphin, MD  DISCHARGE DIAGNOSIS:  Complicated Migraine  s/p TNK Administration  Principal Problem:   Complicated migraine Active Problems:   CVA (cerebral vascular accident) (HCC) Transient left-sided weakness and numbness   Allergies as of 04/28/2024       Reactions   Cymbalta [duloxetine Hcl]    Ineffective   Lyrica [pregabalin] Other (See Comments)   Ineffective   Tramadol    Ineffective        Medication List     STOP taking these medications    OVER THE COUNTER MEDICATION       TAKE these medications    Aimovig  140 MG/ML Soaj Generic drug: Erenumab -aooe Inject 140 mg as directed every 28 (twenty-eight) days. ADMINISTER 1 ML(140MG ) UNDER THE SKIN EVERY 30 DAYS   albuterol  108 (90 Base) MCG/ACT inhaler Commonly known as: VENTOLIN  HFA Inhale 2 puffs into the lungs every 6 (six) hours as needed for wheezing or shortness of breath.   atorvastatin 80 MG tablet Commonly known as: LIPITOR Take 1 tablet (80 mg total) by mouth daily.   BIOTIN PO Take 1 tablet by mouth daily.   buPROPion  300 MG 24 hr tablet Commonly known as: WELLBUTRIN  XL TAKE 1 TABLET(300 MG) BY MOUTH DAILY   citalopram  40 MG tablet Commonly known as: CELEXA  TAKE 1 TABLET(40 MG) BY MOUTH DAILY What changed:  how much to take how to take this when to take this additional instructions   HYDROcodone -acetaminophen  5-325 MG tablet Commonly known as: NORCO/VICODIN Take 0.5-1 tablets by mouth every 8 (eight) hours as needed for moderate pain (pain score 4-6).   Linzess 72 MCG capsule Generic drug: linaclotide Take 72 mcg by mouth every 3 (three) days.   Mounjaro 7.5 MG/0.5ML Pen Generic drug: tirzepatide Inject 7.5 mg into the skin once a week.   NuvaRing  0.12-0.015 MG/24HR vaginal ring Generic drug: etonogestrel-ethinyl estradiol Place 1 each vaginally every 28 (twenty-eight) days.   omeprazole  40 MG capsule Commonly known as: PRILOSEC Take 1 capsule (40 mg total) by mouth daily.   ondansetron  4 MG disintegrating tablet Commonly known as: ZOFRAN -ODT DISSOLVE 1 TABLET(4 MG) ON THE TONGUE EVERY 8 HOURS AS NEEDED FOR NAUSEA OR VOMITING   rizatriptan  10 MG tablet Commonly known as: MAXALT  TAKE 1 TABLET BY MOUTH AT ONSET OF MIGRAINE. MAY REPEAT IN 2 HOURS AS NEEDED   tiZANidine  4 MG tablet Commonly known as: ZANAFLEX  TAKE 1 TABLET(4 MG) BY MOUTH EVERY 6 HOURS AS NEEDED FOR MUSCLE SPASMS   traZODone  150 MG tablet Commonly known as: DESYREL  TAKE 1 TABLET BY MOUTH EVERY NIGHT AS DIRECTED. MAY INCREASE TO 2 TABLETS EVERY NIGHT AS DIRECTED What changed:  how much to take how to take this when to take this additional instructions   VITAMIN D2 + K1 PO Take 1 tablet by mouth daily.   Vitamin D3 25 MCG (1000 UT) Caps Take 1 capsule (1,000 Units total) by mouth daily.        LABORATORY STUDIES CBC    Component Value Date/Time   WBC 10.8 (H) 04/27/2024 1721   RBC 4.13 04/27/2024 1721   HGB 13.0 04/27/2024 1721   HCT 39.9 04/27/2024 1721   PLT 361 04/27/2024 1721   MCV 96.6 04/27/2024  1721   MCH 31.5 04/27/2024 1721   MCHC 32.6 04/27/2024 1721   RDW 12.4 04/27/2024 1721   LYMPHSABS 4.2 (H) 04/27/2024 1721   MONOABS 1.1 (H) 04/27/2024 1721   EOSABS 0.1 04/27/2024 1721   BASOSABS 0.1 04/27/2024 1721   CMP    Component Value Date/Time   NA 138 04/27/2024 1721   K 3.7 04/27/2024 1721   CL 107 04/27/2024 1721   CO2 23 04/27/2024 1721   GLUCOSE 81 04/27/2024 1721   BUN 16 04/27/2024 1721   CREATININE 0.97 04/27/2024 1721   CALCIUM 9.2 04/27/2024 1721   PROT 7.0 04/27/2024 1721   ALBUMIN 3.8 04/27/2024 1721   AST 19 04/27/2024 1721   ALT 10 04/27/2024 1721   ALKPHOS 37 (L) 04/27/2024 1721   BILITOT 0.4 04/27/2024 1721    GFRNONAA >60 04/27/2024 1721   COAGS Lab Results  Component Value Date   INR 1.0 04/27/2024   Lipid Panel    Component Value Date/Time   CHOL 172 04/28/2024 0858   TRIG 79 04/28/2024 0858   HDL 56 04/28/2024 0858   CHOLHDL 3.1 04/28/2024 0858   VLDL 16 04/28/2024 0858   LDLCALC 100 (H) 04/28/2024 0858   HgbA1C  Lab Results  Component Value Date   HGBA1C 4.3 (L) 04/28/2024   Urinalysis    Component Value Date/Time   COLORURINE YELLOW 07/25/2023 0940   APPEARANCEUR CLEAR 07/25/2023 0940   LABSPEC >=1.030 (A) 07/25/2023 0940   PHURINE 6.0 07/25/2023 0940   GLUCOSEU NEGATIVE 07/25/2023 0940   HGBUR SMALL (A) 07/25/2023 0940   BILIRUBINUR NEGATIVE 07/25/2023 0940   KETONESUR NEGATIVE 07/25/2023 0940   PROTEINUR NEGATIVE 04/10/2021 1112   UROBILINOGEN 0.2 07/25/2023 0940   NITRITE NEGATIVE 07/25/2023 0940   LEUKOCYTESUR NEGATIVE 07/25/2023 0940   Urine Drug Screen     Component Value Date/Time   LABOPIA NONE DETECTED 04/27/2024 2003   COCAINSCRNUR NONE DETECTED 04/27/2024 2003   LABBENZ NONE DETECTED 04/27/2024 2003   AMPHETMU NONE DETECTED 04/27/2024 2003   THCU NONE DETECTED 04/27/2024 2003   LABBARB NONE DETECTED 04/27/2024 2003    Alcohol Level    Component Value Date/Time   [facility] <15 04/27/2024 1722     SIGNIFICANT DIAGNOSTIC STUDIES ECHOCARDIOGRAM COMPLETE Result Date: 04/28/2024    ECHOCARDIOGRAM REPORT   Patient Name:   OURANIA HAMLER Date of Exam: 04/28/2024 Medical Rec #:  [id number]    Height:       65.0 in Accession #:    [id number]   Weight:       139.9 lb Date of Birth:  03-Oct-1971    BSA:          1.699 m Patient Age:    53 years     BP:           102/70 mmHg Patient Gender: F            HR:           80 bpm. Exam Location:  Inpatient Procedure: 2D Echo, Cardiac Doppler, Color Doppler and Saline Contrast Bubble            Study (Both Spectral and Color Flow Doppler were utilized during            procedure). Indications:    TIA  History:        Patient  has no prior history of Echocardiogram examinations.  Sonographer:    Juanita Shaw Referring Phys: [id number] SRISHTI L BHAGAT IMPRESSIONS  1. Left ventricular  ejection fraction, by estimation, is 60 to 65%. The left ventricle has normal function. The left ventricle has no regional wall motion abnormalities. Left ventricular diastolic parameters were normal.  2. Right ventricular systolic function is normal. The right ventricular size is normal.  3. The mitral valve is normal in structure. No evidence of mitral valve regurgitation. No evidence of mitral stenosis.  4. The aortic valve is normal in structure. Aortic valve regurgitation is not visualized. No aortic stenosis is present.  5. The inferior vena cava is normal in size with greater than 50% respiratory variability, suggesting right atrial pressure of 3 mmHg. FINDINGS  Left Ventricle: Left ventricular ejection fraction, by estimation, is 60 to 65%. The left ventricle has normal function. The left ventricle has no regional wall motion abnormalities. The left ventricular internal cavity size was normal in size. There is  no left ventricular hypertrophy. Left ventricular diastolic parameters were normal. Right Ventricle: The right ventricular size is normal. No increase in right ventricular wall thickness. Right ventricular systolic function is normal. Left Atrium: Left atrial size was normal in size. Right Atrium: Right atrial size was normal in size. Pericardium: There is no evidence of pericardial effusion. Mitral Valve: The mitral valve is normal in structure. No evidence of mitral valve regurgitation. No evidence of mitral valve stenosis. MV peak gradient, 1.5 mmHg. The mean mitral valve gradient is 1.0 mmHg. Tricuspid Valve: The tricuspid valve is normal in structure. Tricuspid valve regurgitation is not demonstrated. No evidence of tricuspid stenosis. Aortic Valve: The aortic valve is normal in structure. Aortic valve regurgitation is not visualized. No  aortic stenosis is present. Aortic valve mean gradient measures 3.0 mmHg. Aortic valve peak gradient measures 6.0 mmHg. Aortic valve area, by VTI measures 1.82 cm. Pulmonic Valve: The pulmonic valve was normal in structure. Pulmonic valve regurgitation is not visualized. No evidence of pulmonic stenosis. Aorta: The aortic root is normal in size and structure. Venous: The inferior vena cava is normal in size with greater than 50% respiratory variability, suggesting right atrial pressure of 3 mmHg. IAS/Shunts: No atrial level shunt detected by color flow Doppler. Agitated saline contrast was given intravenously to evaluate for intracardiac shunting.  LEFT VENTRICLE PLAX 2D LVIDd:         3.90 cm     Diastology LVIDs:         2.80 cm     LV e' medial:    10.10 cm/s LV PW:         0.70 cm     LV E/e' medial:  5.8 LV IVS:        0.80 cm     LV e' lateral:   11.90 cm/s LVOT diam:     1.70 cm     LV E/e' lateral: 4.9 LV SV:         39 LV SV Index:   23 LVOT Area:     2.27 cm  LV Volumes (MOD) LV vol d, MOD A2C: 68.3 ml LV vol d, MOD A4C: 70.2 ml LV vol s, MOD A2C: 20.7 ml LV vol s, MOD A4C: 29.2 ml LV SV MOD A2C:     47.6 ml LV SV MOD A4C:     70.2 ml LV SV MOD BP:      45.2 ml RIGHT VENTRICLE             IVC RV Basal diam:  3.00 cm     IVC diam: 1.10 cm RV Mid diam:  2.10 cm RV S prime:     12.40 cm/s TAPSE (M-mode): 1.8 cm LEFT ATRIUM             Index        RIGHT ATRIUM          Index LA diam:        2.20 cm 1.29 cm/m   RA Area:     8.65 cm LA Vol (A2C):   22.1 ml 13.00 ml/m  RA Volume:   16.80 ml 9.89 ml/m LA Vol (A4C):   6.2 ml  3.65 ml/m LA Biplane Vol: 11.8 ml 6.94 ml/m  AORTIC VALVE                    PULMONIC VALVE AV Area (Vmax):    1.92 cm     PV Vmax:       1.01 m/s AV Area (Vmean):   1.56 cm     PV Peak grad:  4.1 mmHg AV Area (VTI):     1.82 cm AV Vmax:           122.00 cm/s AV Vmean:          82.300 cm/s AV VTI:            0.213 m AV Peak Grad:      6.0 mmHg AV Mean Grad:      3.0 mmHg LVOT  Vmax:         103.00 cm/s LVOT Vmean:        56.600 cm/s LVOT VTI:          0.171 m LVOT/AV VTI ratio: 0.80  AORTA Ao Root diam: 2.90 cm Ao Asc diam:  2.60 cm MITRAL VALVE MV Area (PHT): 3.13 cm    SHUNTS MV Area VTI:   3.18 cm    Systemic VTI:  0.17 m MV Peak grad:  1.5 mmHg    Systemic Diam: 1.70 cm MV Mean grad:  1.0 mmHg MV Vmax:       0.62 m/s MV Vmean:      45.2 cm/s MV Decel Time: 242 msec MV E velocity: 58.20 cm/s MV A velocity: 68.10 cm/s MV E/A ratio:  0.85 Mihai Croitoru MD Electronically signed by Luana Rumple MD Signature Date/Time: 04/28/2024/2:56:18 PM    Final    CT ANGIO HEAD NECK W WO CM Result Date: 04/28/2024 CLINICAL DATA:  Stroke, determine embolic source EXAM: CT ANGIOGRAPHY HEAD AND NECK WITH AND WITHOUT CONTRAST TECHNIQUE: Multidetector CT imaging of the head and neck was performed using the standard protocol during bolus administration of intravenous contrast. Multiplanar CT image reconstructions and MIPs were obtained to evaluate the vascular anatomy. Carotid stenosis measurements (when applicable) are obtained utilizing NASCET criteria, using the distal internal carotid diameter as the denominator. RADIATION DOSE REDUCTION: This exam was performed according to the departmental dose-optimization program which includes automated exposure control, adjustment of the mA and/or kV according to patient size and/or use of iterative reconstruction technique. CONTRAST:  75mL OMNIPAQUE  IOHEXOL  350 MG/ML SOLN COMPARISON:  Yesterday FINDINGS: CT HEAD FINDINGS Brain: No evidence of acute infarction, hemorrhage, hydrocephalus, extra-axial collection or mass lesion/mass effect. Vascular: No hyperdense vessel or unexpected calcification. Skull: Normal. Negative for fracture or focal lesion. Sinuses/Orbits: No acute finding. Review of the MIP images confirms the above findings CTA NECK FINDINGS Aortic arch: Mild atheromatous plaque. Right carotid system: Mild atheromatous plaque at the proximal ICA.  No ulceration or flow reducing stenosis Left carotid system: Mild atheromatous  plaque centered at the bifurcation. No stenosis or ulceration Vertebral arteries: No proximal subclavian or vertebral stenosis. Skeleton: No acute or aggressive finding Other neck: Unremarkable Upper chest: Clear apical lungs Review of the MIP images confirms the above findings CTA HEAD FINDINGS Anterior circulation: No significant stenosis, proximal occlusion, aneurysm, or vascular malformation. Posterior circulation: No significant stenosis, proximal occlusion, aneurysm, or vascular malformation. Venous sinuses: As permitted by contrast timing, patent. Anatomic variants: None significant Review of the MIP images confirms the above findings IMPRESSION: No emergent finding. Mild cervical carotid atherosclerosis. No stenosis or irregularity of major arteries in the head and neck. Electronically Signed   By: Ronnette Coke M.D.   On: 04/28/2024 07:09   CT HEAD CODE STROKE WO CONTRAST Result Date: 04/27/2024 CLINICAL DATA:  Code stroke. Neuro deficit, acute, stroke suspected. Sudden onset of generalized weakness, left upper extremity tingling, and dizziness. EXAM: CT HEAD WITHOUT CONTRAST TECHNIQUE: Contiguous axial images were obtained from the base of the skull through the vertex without intravenous contrast. RADIATION DOSE REDUCTION: This exam was performed according to the departmental dose-optimization program which includes automated exposure control, adjustment of the mA and/or kV according to patient size and/or use of iterative reconstruction technique. COMPARISON:  Head MRI 07/09/2019 FINDINGS: Brain: There is no evidence of an acute infarct, intracranial hemorrhage, mass, midline shift, or extra-axial fluid collection. Cerebral volume is normal. The ventricles are normal in size. Vascular: No hyperdense vessel. Skull: No fracture or suspicious lesion. Sinuses/Orbits: Visualized paranasal sinuses and mastoid air cells are  clear. Unremarkable orbits. Other: None. ASPECTS ([place] Stroke Program Early CT Score) - Ganglionic level infarction (caudate, lentiform nuclei, internal capsule, insula, M1-M3 cortex): 7 - Supraganglionic infarction (M4-M6 cortex): 3 Total score (0-10 with 10 being normal): 10 These results were communicated to Dr. Janett Medin at 4:06 pm on 04/27/2024 by text page via the [facility] messaging system. IMPRESSION: Negative head CT.  ASPECTS of 10. Electronically Signed   By: Aundra Lee M.D.   On: 04/27/2024 16:07      HISTORY OF PRESENT ILLNESS 53 y.o. female with hx of migraine, fibromyalgias, depression, colon polyps, GERD, who presented to [facility] with L sided numbness/tingling involving left arm and leg along with headache. She felt very tingly in her left foot and in her left hand in addition, was weak on the left side.  EMS called and upon EMS arrival, she was noted to have difficulty with getting up, she felt weak on the left side.  She was brought into [facility] ED where a code stroke was activated.  She was eval by teleneurology and was given TNKase .  MRI negative for stroke.   HOSPITAL COURSE  Complicated Hemiplegic Migraine s/p TNK  Code Stroke CT head No acute abnormality. ASPECTS 10.    CTA head & neck: No emergent finding MRI: Negative 2D Echo: EF 60 to 65% LDL 158 HgbA1c 4.3 VTE prophylaxis - SCDs No antithrombotic prior to admission, no need for antithrombotics as of now due to no stroke seen on MRI.  Therapy recommendations:  Outpatient PT/OT/ST Disposition:  home   Hypertension Home meds:  none Stable BP goal: normotensive  Hyperlipidemia Home meds:  none LDL 158, goal < 70 Add Lipitor 80mg  for secondary stroke prevention  Continue statin at discharge  Other Stroke Risk Factors Family hx stroke (maternal grandfather, paternal grandfather) Estrogen use--Nuvaring Migraines PTA med: Maxalt    DISCHARGE EXAM Blood pressure 106/64, pulse 70, temperature 98.5 F (36.9 C),  temperature source Oral, resp. rate  16, height 5\' 5"  (1.651 m), weight 63.5 kg, SpO2 97%. Neuro: Mental Status: Patient is awake, alert, oriented to person, place, month, year, and situation. Patient is able to give a clear and coherent history. No signs of aphasia or neglect Cranial Nerves: II: Visual Fields are full. Pupils are equal, round, and reactive to light.   III,IV, VI: EOMI without ptosis or diploplia.  V: Facial sensation is symmetric to temperature VII: Facial movement is symmetric.  VIII: hearing is intact to voice X: Uvula elevates symmetrically XI: Shoulder shrug is symmetric. XII: tongue is midline without atrophy or fasciculations.  Motor: Tone is normal. Bulk is normal.  Reduced ROM due to pain in L hip and associated sciatica pain per patient Sensory: Sensation is symmetric to light touch and temperature in the arms and legs. Cerebellar: FNF intact bilaterally. HKS reduced on left due to hip pain.   Discharge Diet       Diet   Diet regular Room service appropriate? Yes; Fluid consistency: Thin   liquids  DISCHARGE PLAN Disposition:  home with outpatient PT/OT Follow-up PCP Colene Dauphin, MD in 2 weeks. Follow-up in [facility] in 8 weeks, office to schedule an appointment for migraine management  35 minutes were spent preparing discharge.   Pt seen by Neuro NP/APP and later by MD. Note/plan to be edited by MD as needed.    Audrene Lease, DNP, AGACNP-BC [facility] Please use AMION for contact information & EPIC for messaging.  I have personally obtained history,examined this patient, reviewed notes, independently viewed imaging studies, participated in medical decision making and plan of care.ROS completed by me personally and pertinent positives fully documented  I have made any additions or clarifications directly to the above note. Agree with note above.    Ardella Beaver, MD Medical Director [facility]  Stroke Center Pager: [phone] 04/28/2024 6:47 PM

## 2024-04-28 NOTE — Progress Notes (Signed)
 Kindly inform the patient that MRI scan of the brain was normal and did not show any stroke or worrisome finding

## 2024-04-29 ENCOUNTER — Other Ambulatory Visit: Payer: Self-pay | Admitting: Neurology

## 2024-04-29 ENCOUNTER — Telehealth: Payer: Self-pay

## 2024-04-29 ENCOUNTER — Encounter: Payer: Self-pay | Admitting: Neurology

## 2024-04-29 ENCOUNTER — Telehealth: Payer: Self-pay | Admitting: Neurology

## 2024-04-29 LAB — MISC LABCORP TEST (SEND OUT): Labcorp test code: 83935

## 2024-04-29 MED ORDER — NURTEC 75 MG PO TBDP: 1.0000 | ORAL_TABLET | Freq: Every day | ORAL | 11 refills | Status: DC | PRN

## 2024-04-29 NOTE — Transitions of Care (Post Inpatient/ED Visit) (Signed)
 04/29/2024  Name: Teresa Campbell MRN: [id number] DOB: 22-Dec-1970  Today's TOC FU Call Status: Today's TOC FU Call Status:: Successful TOC FU Call Completed TOC FU Call Complete Date: 04/29/24 Patient's Name and Date of Birth confirmed.  Transition Care Management Follow-up Telephone Call Date of Discharge: 04/28/24 Discharge Facility: [facility] Type of Discharge: Inpatient Admission Primary Inpatient Discharge Diagnosis:: migraine How have you been since you were released from the hospital?: Better Any questions or concerns?: No  Items Reviewed: Did you receive and understand the discharge instructions provided?: Yes Medications obtained,verified, and reconciled?: Yes (Medications Reviewed) Any new allergies since your discharge?: No Dietary orders reviewed?: Yes  Medications Reviewed Today: Medications Reviewed Today     Reviewed by Darrall Ellison, LPN (Licensed Practical Nurse) on 04/29/24 at 0957  Med List Status: <None>   Medication Order Taking? Sig Documenting Provider Last Dose Status Informant  albuterol  (VENTOLIN  HFA) 108 (90 Base) MCG/ACT inhaler [id number] Yes Inhale 2 puffs into the lungs every 6 (six) hours as needed for wheezing or shortness of breath. Colene Dauphin, MD Taking Active Self, Pharmacy Records  atorvastatin (LIPITOR) 80 MG tablet [id number] Yes Take 1 tablet (80 mg total) by mouth daily. Audrene Lease, NP Taking Active   BIOTIN PO [id number] Yes Take 1 tablet by mouth daily. [provider] Taking Active Self, Pharmacy Records  buPROPion  (WELLBUTRIN  XL) 300 MG 24 hr tablet [id number] Yes TAKE 1 TABLET(300 MG) BY MOUTH DAILY Burns, Beckey Bourgeois, MD Taking Active Self, Pharmacy Records  Cholecalciferol  (VITAMIN D3) 25 MCG (1000 UT) CAPS [id number] Yes Take 1 capsule (1,000 Units total) by mouth daily. Colene Dauphin, MD Taking Active Self, Pharmacy Records  citalopram  (CELEXA ) 40 MG tablet [id number] Yes TAKE 1 TABLET(40 MG) BY MOUTH DAILY  Patient  taking differently: Take 40 mg by mouth at bedtime.   Colene Dauphin, MD Taking Active Self, Pharmacy Records  Erenumab -aooe (AIMOVIG ) 140 MG/ML [place] [id number] Yes Inject 140 mg as directed every 28 (twenty-eight) days. ADMINISTER 1 ML(140MG ) UNDER THE SKIN EVERY 30 DAYS Merriam Abbey, DO Taking Active Self, Pharmacy Records  HYDROcodone -acetaminophen  (NORCO/VICODIN) 5-325 MG tablet [id number] Yes Take 0.5-1 tablets by mouth every 8 (eight) hours as needed for moderate pain (pain score 4-6). [provider] Taking Active Self, Pharmacy Records  LINZESS 72 MCG capsule [id number] Yes Take 72 mcg by mouth every 3 (three) days. [provider] Taking Active Self, Pharmacy Records  NUVARING 0.12-0.015 MG/24HR vaginal ring [id number] Yes Place 1 each vaginally every 28 (twenty-eight) days. [provider] Taking Active Self, Pharmacy Records  omeprazole  (PRILOSEC) 40 MG capsule [id number] Yes Take 1 capsule (40 mg total) by mouth daily. Adra Alanis, FNP Taking Active Self, Pharmacy Records  ondansetron  (ZOFRAN -ODT) 4 MG disintegrating tablet [id number] Yes DISSOLVE 1 TABLET(4 MG) ON THE TONGUE EVERY 8 HOURS AS NEEDED FOR NAUSEA OR VOMITING Merriam Abbey, DO Taking Active Self, Pharmacy Records  rizatriptan  (MAXALT ) 10 MG tablet [id number] No TAKE 1 TABLET BY MOUTH AT ONSET OF MIGRAINE. MAY REPEAT IN 2 HOURS AS NEEDED  Patient not taking: Reported on 04/29/2024   Merriam Abbey, DO Not Taking Active Self, Pharmacy Records  tirzepatide [facility]) 7.5 MG/0.5ML Pen [id number] Yes Inject 7.5 mg into the skin once a week. [provider] Taking Active Self, Pharmacy Records  tiZANidine  (ZANAFLEX ) 4 MG tablet [id number] Yes TAKE 1 TABLET(4 MG) BY MOUTH EVERY 6 HOURS AS NEEDED FOR MUSCLE SPASMS Merriam Abbey, DO Taking Active  Self, Pharmacy Records  traZODone  (DESYREL ) 150 MG tablet [id number] Yes TAKE 1 TABLET BY MOUTH EVERY NIGHT AS DIRECTED. MAY INCREASE TO 2 TABLETS EVERY NIGHT  AS DIRECTED  Patient taking differently: Take 150 mg by mouth at bedtime.   Colene Dauphin, MD Taking Active Self, Pharmacy Records  Vitamin D -Vitamin K (VITAMIN D2 + K1 PO) [id number] Yes Take 1 tablet by mouth daily. [provider] Taking Active Self, Pharmacy Records            Home Care and Equipment/Supplies: Were Home Health Services Ordered?: NA Any new equipment or medical supplies ordered?: NA  Functional Questionnaire: Do you need assistance with bathing/showering or dressing?: No Do you need assistance with meal preparation?: No Do you need assistance with eating?: No Do you have difficulty maintaining continence: No Do you need assistance with getting out of bed/getting out of a chair/moving?: No Do you have difficulty managing or taking your medications?: No  Follow up appointments reviewed: PCP Follow-up appointment confirmed?: Yes Date of PCP follow-up appointment?: 05/06/24 Follow-up Provider: [facility] Follow-up appointment confirmed?: Yes Date of Specialist follow-up appointment?: 07/13/24 Follow-Up Specialty Provider:: neuro Do you need transportation to your follow-up appointment?: No Do you understand care options if your condition(s) worsen?: Yes-patient verbalized understanding    SIGNATURE Darrall Ellison, LPN [facility] Nurse Health Advisor Direct Dial [phone]

## 2024-04-29 NOTE — Telephone Encounter (Signed)
 Sending in prescription for Nurtec as acute treatment (QTY 8).  Patient cannot take triptans due to having hemiplegic migraine/migraine with stroke symptoms.  Even though I am prescribing Nurtec for acute treatment, [employer] may want her to try either a beta blocker, candesartan or topiramate .  She has been on topiramate  in the past.

## 2024-04-30 ENCOUNTER — Telehealth: Payer: Self-pay | Admitting: Pharmacy Technician

## 2024-04-30 ENCOUNTER — Other Ambulatory Visit (HOSPITAL_COMMUNITY): Payer: Self-pay

## 2024-04-30 NOTE — Telephone Encounter (Signed)
 Pharmacy Patient Advocate Encounter  Received notification from [employer] that Prior Authorization for NURTEC 75MG  has been APPROVED from 05.22.25 to 5.22.26. Ran test claim, Copay is $0. This test claim was processed through [facility]- copay amounts may vary at other pharmacies due to pharmacy/plan contracts, or as the patient moves through the different stages of their insurance plan.   PA #/Case ID/Reference #: [id number]

## 2024-04-30 NOTE — Telephone Encounter (Signed)
 PA has been submitted, and telephone encounter has been created. Please see telephone encounter dated 5.22.25.

## 2024-04-30 NOTE — Telephone Encounter (Signed)
 Pharmacy Patient Advocate Encounter   Received notification from Pt Calls Messages that prior authorization for NURTEC 75MG  is required/requested.   Insurance verification completed.   The patient is insured through [facility] [employer] .   Per test claim: PA required; PA submitted to above mentioned insurance via CoverMyMeds Key/confirmation #/EOC [facility] Status is pending

## 2024-05-05 ENCOUNTER — Encounter: Payer: Self-pay | Admitting: Internal Medicine

## 2024-05-05 NOTE — Patient Instructions (Incomplete)
   Blood work was ordered.       Medications changes include :   decrease lasix to 20 mg daily and decrease potassium to 20 mg daily   A referral was ordered for the heart failure clinic.      Return in about 2 months (around 12/29/2023) for follow up.

## 2024-05-05 NOTE — Progress Notes (Deleted)
 Subjective:    Patient ID: Teresa Campbell, female    DOB: 08/14/1971, 53 y.o.   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  She felt tingling in her left foot and in her left hand.  There was weakness on the left side.  EMS was called and when they arrived she was having difficulty getting up and felt weakness on the left side.  Code stroke initiated on arrival to ED.  She was evaluated by teleneurology and then given TNKase .  MRI was negative for stroke.  CT head without acute abnormality.  CTA head and neck with no emergent finding.  MRI head negative.  2D echo with EF 60-65%.  LDL 158.  A1c 4.3%.  VTE prophylaxis with SCDs.  No antithrombotic prior to admission-no need for antithrombotics given negative MRI for stroke.  Therapy recommendations outpatient PT/OT/ST.  Hyperlipidemia-LDL 158 Goal less than 70-started on Lipitor 80 mg for stroke prevention  Migraine headaches- Follows with neurology Neurology has changed her rescue medication-not able to take triptans anymore Started on Nurtec as needed   Medications and allergies reviewed with patient and updated if appropriate.  Current Outpatient Medications on File Prior to Visit  Medication Sig Dispense Refill   albuterol  (VENTOLIN  HFA) 108 (90 Base) MCG/ACT inhaler Inhale 2 puffs into the lungs every 6 (six) hours as needed for wheezing or shortness of breath. 8 g 2   atorvastatin (LIPITOR) 80 MG tablet Take 1 tablet (80 mg total) by mouth daily. 60 tablet 0   BIOTIN PO Take 1 tablet by mouth daily.     buPROPion  (WELLBUTRIN  XL) 300 MG 24 hr tablet TAKE 1 TABLET(300 MG) BY MOUTH DAILY 90 tablet 2   Cholecalciferol  (VITAMIN D3) 25 MCG (1000 UT) CAPS Take 1 capsule (1,000 Units total) by mouth daily. 60 capsule    citalopram  (CELEXA ) 40 MG tablet TAKE 1  TABLET(40 MG) BY MOUTH DAILY (Patient taking differently: Take 40 mg by mouth at bedtime.) 90 tablet 1   Erenumab -aooe (AIMOVIG ) 140 MG/ML SOAJ Inject 140 mg as directed every 28 (twenty-eight) days. ADMINISTER 1 ML(140MG ) UNDER THE SKIN EVERY 30 DAYS 1 mL 5   HYDROcodone -acetaminophen  (NORCO/VICODIN) 5-325 MG tablet Take 0.5-1 tablets by mouth every 8 (eight) hours as needed for moderate pain (pain score 4-6).  0   LINZESS 72 MCG capsule Take 72 mcg by mouth every 3 (three) days.     NUVARING 0.12-0.015 MG/24HR vaginal ring Place 1 each vaginally every 28 (twenty-eight) days.     omeprazole  (PRILOSEC) 40 MG capsule Take 1 capsule (40 mg total) by mouth daily. 90 capsule 3   ondansetron  (ZOFRAN -ODT) 4 MG disintegrating tablet DISSOLVE 1 TABLET(4 MG) ON THE TONGUE EVERY 8 HOURS AS NEEDED FOR NAUSEA OR VOMITING 20 tablet 3   Rimegepant Sulfate (NURTEC) 75 MG TBDP Take 1 tablet (75 mg total) by mouth daily as needed. 8 tablet 11   tirzepatide (MOUNJARO) 7.5 MG/0.5ML Pen Inject 7.5 mg into the skin once a week.     tiZANidine  (ZANAFLEX ) 4 MG tablet TAKE 1 TABLET(4 MG) BY MOUTH EVERY 6 HOURS AS NEEDED FOR MUSCLE SPASMS 30 tablet 5   traZODone  (DESYREL ) 150 MG tablet TAKE 1 TABLET BY MOUTH EVERY NIGHT AS DIRECTED. MAY INCREASE TO 2 TABLETS EVERY NIGHT AS DIRECTED (Patient taking  differently: Take 150 mg by mouth at bedtime.) 90 tablet 1   Vitamin D -Vitamin K (VITAMIN D2 + K1 PO) Take 1 tablet by mouth daily.     No current facility-administered medications on file prior to visit.     Review of Systems     Objective:  There were no vitals filed for this visit. BP Readings from Last 3 Encounters:  04/28/24 106/64  03/17/24 112/72  01/28/24 114/82   Wt Readings from Last 3 Encounters:  04/27/24 139 lb 14.4 oz (63.5 kg)  03/17/24 136 lb (61.7 kg)  01/28/24 135 lb (61.2 kg)   There is no height or weight on file to calculate BMI.    Physical Exam     Lab Results  Component Value Date    WBC 10.8 (H) 04/27/2024   HGB 13.0 04/27/2024   HCT 39.9 04/27/2024   PLT 361 04/27/2024   GLUCOSE 81 04/27/2024   CHOL 172 04/28/2024   TRIG 79 04/28/2024   HDL 56 04/28/2024   LDLCALC 100 (H) 04/28/2024   ALT 10 04/27/2024   AST 19 04/27/2024   NA 138 04/27/2024   K 3.7 04/27/2024   CL 107 04/27/2024   CREATININE 0.97 04/27/2024   BUN 16 04/27/2024   CO2 23 04/27/2024   TSH 3.75 07/25/2023   INR 1.0 04/27/2024   HGBA1C 4.3 (L) 04/28/2024   MR BRAIN WO CONTRAST CLINICAL DATA:  Initial evaluation for acute neuro deficit, stroke suspected.  EXAM: MRI HEAD WITHOUT CONTRAST  TECHNIQUE: Multiplanar, multiecho pulse sequences of the brain and surrounding structures were obtained without intravenous contrast.  COMPARISON:  Prior CT from earlier the same day as well as brain MRI from 07/09/2019.  FINDINGS: Brain: Cerebral volume within normal limits for age. No focal parenchymal signal abnormality.  No abnormal foci of restricted diffusion to suggest acute or subacute ischemia. Gray-white matter differentiation well maintained. No encephalomalacia to suggest chronic cortical infarction or other insult. No foci of susceptibility artifact indicative of acute or chronic intracranial blood products.  No mass lesion, midline shift or mass effect. Ventricles normal in size and morphology without hydrocephalus. No extra-axial fluid collection.  Pituitary gland and suprasellar region within normal limits.  Vascular: Major intracranial vascular flow voids are well maintained.  Skull and upper cervical spine: Craniocervical junction within normal limits. Visualized upper cervical spine demonstrates no significant finding. Bone marrow signal intensity within normal limits. No scalp soft tissue abnormality.  Sinuses/Orbits: Globes and orbital soft tissues are within normal limits.  Paranasal sinuses are largely clear. No significant mastoid effusion.  Other:  None.  IMPRESSION: Stable normal brain MRI.  No acute intracranial abnormality.  Electronically Signed   By: Virgia Griffins M.D.   On: 04/28/2024 18:31 ECHOCARDIOGRAM COMPLETE    ECHOCARDIOGRAM REPORT       Patient Name:   AIANNA FAHS Date of Exam: 04/28/2024 Medical Rec #:  [id number]    Height:       65.0 in Accession #:    [id number]   Weight:       139.9 lb Date of Birth:  [birth year]    BSA:          1.699 m Patient Age:    53 years     BP:           102/70 mmHg Patient Gender: F            HR:           80 bpm. Exam  Location:  Inpatient  Procedure: 2D Echo, Cardiac Doppler, Color Doppler and Saline Contrast Bubble            Study (Both Spectral and Color Flow Doppler were utilized during            procedure).  Indications:    TIA   History:        Patient has no prior history of Echocardiogram examinations.   Sonographer:    Juanita Shaw Referring Phys: [id number] SRISHTI L BHAGAT  IMPRESSIONS   1. Left ventricular ejection fraction, by estimation, is 60 to 65%. The left ventricle has normal function. The left ventricle has no regional wall motion abnormalities. Left ventricular diastolic parameters were normal.  2. Right ventricular systolic function is normal. The right ventricular size is normal.  3. The mitral valve is normal in structure. No evidence of mitral valve regurgitation. No evidence of mitral stenosis.  4. The aortic valve is normal in structure. Aortic valve regurgitation is not visualized. No aortic stenosis is present.  5. The inferior vena cava is normal in size with greater than 50% respiratory variability, suggesting right atrial pressure of 3 mmHg.  FINDINGS  Left Ventricle: Left ventricular ejection fraction, by estimation, is 60 to 65%. The left ventricle has normal function. The left ventricle has no regional wall motion abnormalities. The left ventricular internal cavity size was normal in size. There is  no left ventricular hypertrophy.  Left ventricular diastolic parameters were normal.  Right Ventricle: The right ventricular size is normal. No increase in right ventricular wall thickness. Right ventricular systolic function is normal.  Left Atrium: Left atrial size was normal in size.  Right Atrium: Right atrial size was normal in size.  Pericardium: There is no evidence of pericardial effusion.  Mitral Valve: The mitral valve is normal in structure. No evidence of mitral valve regurgitation. No evidence of mitral valve stenosis. MV peak gradient, 1.5 mmHg. The mean mitral valve gradient is 1.0 mmHg.  Tricuspid Valve: The tricuspid valve is normal in structure. Tricuspid valve regurgitation is not demonstrated. No evidence of tricuspid stenosis.  Aortic Valve: The aortic valve is normal in structure. Aortic valve regurgitation is not visualized. No aortic stenosis is present. Aortic valve mean gradient measures 3.0 mmHg. Aortic valve peak gradient measures 6.0 mmHg. Aortic valve area, by VTI  measures 1.82 cm.  Pulmonic Valve: The pulmonic valve was normal in structure. Pulmonic valve regurgitation is not visualized. No evidence of pulmonic stenosis.  Aorta: The aortic root is normal in size and structure.  Venous: The inferior vena cava is normal in size with greater than 50% respiratory variability, suggesting right atrial pressure of 3 mmHg.  IAS/Shunts: No atrial level shunt detected by color flow Doppler. Agitated saline contrast was given intravenously to evaluate for intracardiac shunting.    LEFT VENTRICLE PLAX 2D LVIDd:         3.90 cm     Diastology LVIDs:         2.80 cm     LV e' medial:    10.10 cm/s LV PW:         0.70 cm     LV E/e' medial:  5.8 LV IVS:        0.80 cm     LV e' lateral:   11.90 cm/s LVOT diam:     1.70 cm     LV E/e' lateral: 4.9 LV SV:         39 LV SV Index:  23 LVOT Area:     2.27 cm   LV Volumes (MOD) LV vol d, MOD A2C: 68.3 ml LV vol d, MOD A4C: 70.2 ml LV vol s, MOD  A2C: 20.7 ml LV vol s, MOD A4C: 29.2 ml LV SV MOD A2C:     47.6 ml LV SV MOD A4C:     70.2 ml LV SV MOD BP:      45.2 ml  RIGHT VENTRICLE             IVC RV Basal diam:  3.00 cm     IVC diam: 1.10 cm RV Mid diam:    2.10 cm RV S prime:     12.40 cm/s TAPSE (M-mode): 1.8 cm  LEFT ATRIUM             Index        RIGHT ATRIUM          Index LA diam:        2.20 cm 1.29 cm/m   RA Area:     8.65 cm LA Vol (A2C):   22.1 ml 13.00 ml/m  RA Volume:   16.80 ml 9.89 ml/m LA Vol (A4C):   6.2 ml  3.65 ml/m LA Biplane Vol: 11.8 ml 6.94 ml/m  AORTIC VALVE                    PULMONIC VALVE AV Area (Vmax):    1.92 cm     PV Vmax:       1.01 m/s AV Area (Vmean):   1.56 cm     PV Peak grad:  4.1 mmHg AV Area (VTI):     1.82 cm AV Vmax:           122.00 cm/s AV Vmean:          82.300 cm/s AV VTI:            0.213 m AV Peak Grad:      6.0 mmHg AV Mean Grad:      3.0 mmHg LVOT Vmax:         103.00 cm/s LVOT Vmean:        56.600 cm/s LVOT VTI:          0.171 m LVOT/AV VTI ratio: 0.80   AORTA Ao Root diam: 2.90 cm Ao Asc diam:  2.60 cm  MITRAL VALVE MV Area (PHT): 3.13 cm    SHUNTS MV Area VTI:   3.18 cm    Systemic VTI:  0.17 m MV Peak grad:  1.5 mmHg    Systemic Diam: 1.70 cm MV Mean grad:  1.0 mmHg MV Vmax:       0.62 m/s MV Vmean:      45.2 cm/s MV Decel Time: 242 msec MV E velocity: 58.20 cm/s MV A velocity: 68.10 cm/s MV E/A ratio:  0.85  Mihai Croitoru MD Electronically signed by Luana Rumple MD Signature Date/Time: 04/28/2024/2:56:18 PM      Final   CT ANGIO HEAD NECK W WO CM CLINICAL DATA:  Stroke, determine embolic source  EXAM: CT ANGIOGRAPHY HEAD AND NECK WITH AND WITHOUT CONTRAST  TECHNIQUE: Multidetector CT imaging of the head and neck was performed using the standard protocol during bolus administration of intravenous contrast. Multiplanar CT image reconstructions and MIPs were obtained to evaluate the vascular anatomy. Carotid stenosis measurements  (when applicable) are obtained utilizing NASCET criteria, using the distal internal carotid diameter as the denominator.  RADIATION DOSE REDUCTION: This exam was  performed according to the departmental dose-optimization program which includes automated exposure control, adjustment of the mA and/or kV according to patient size and/or use of iterative reconstruction technique.  CONTRAST:  75mL OMNIPAQUE  IOHEXOL  350 MG/ML SOLN  COMPARISON:  Yesterday  FINDINGS: CT HEAD FINDINGS  Brain: No evidence of acute infarction, hemorrhage, hydrocephalus, extra-axial collection or mass lesion/mass effect.  Vascular: No hyperdense vessel or unexpected calcification.  Skull: Normal. Negative for fracture or focal lesion.  Sinuses/Orbits: No acute finding.  Review of the MIP images confirms the above findings  CTA NECK FINDINGS  Aortic arch: Mild atheromatous plaque.  Right carotid system: Mild atheromatous plaque at the proximal ICA. No ulceration or flow reducing stenosis  Left carotid system: Mild atheromatous plaque centered at the bifurcation. No stenosis or ulceration  Vertebral arteries: No proximal subclavian or vertebral stenosis.  Skeleton: No acute or aggressive finding  Other neck: Unremarkable  Upper chest: Clear apical lungs  Review of the MIP images confirms the above findings  CTA HEAD FINDINGS  Anterior circulation: No significant stenosis, proximal occlusion, aneurysm, or vascular malformation.  Posterior circulation: No significant stenosis, proximal occlusion, aneurysm, or vascular malformation.  Venous sinuses: As permitted by contrast timing, patent.  Anatomic variants: None significant  Review of the MIP images confirms the above findings  IMPRESSION: No emergent finding.  Mild cervical carotid atherosclerosis. No stenosis or irregularity of major arteries in the head and neck.  Electronically Signed   By: Ronnette Coke M.D.   On:  04/28/2024 07:09    Assessment & Plan:    See Problem List for Assessment and Plan of chronic medical problems.

## 2024-05-06 ENCOUNTER — Inpatient Hospital Stay: Admitting: Internal Medicine

## 2024-05-10 ENCOUNTER — Encounter: Payer: Self-pay | Admitting: Internal Medicine

## 2024-05-10 NOTE — Progress Notes (Unsigned)
 Subjective:    Patient ID: Teresa Campbell, female    DOB: 12-02-1971, 53 y.o.   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  She felt tingling in her left foot and in her left hand.  There was weakness on the left side.  EMS was called and when they arrived she was having difficulty getting up and felt weakness on the left side.  Code stroke initiated on arrival to ED.  She was evaluated by teleneurology and then given TNKase .  MRI was negative for stroke.  CT head without acute abnormality.  CTA head and neck with no emergent finding.  MRI head negative.  2D echo with EF 60-65%.  LDL 158.  A1c 4.3%.  VTE prophylaxis with SCDs.  No antithrombotic prior to admission-no need for antithrombotics given negative MRI for stroke.  Therapy recommendations outpatient PT/OT/ST.  Hyperlipidemia-LDL 158 Goal less than 70-started on Lipitor  80 mg for stroke prevention  Migraine headaches- Follows with neurology Neurology has changed her rescue medication-not able to take triptans anymore Started on Nurtec as needed   Has not started lipitor  - does not want to take it.  He was also prescribed for secondary prevention of stroke and she did not have stroke.  Her repeat LDL was 100.  She does feel more tired since this occurred.  She is using the trazodone  nightly and it does help.  The dogs get her up twice during the night.  Her back pain also wakes her up intermittently when she moves in certain ways.  Has not had any headaches or migraines since the hospitalization.   Medications and allergies reviewed with patient and updated if appropriate.  Current Outpatient Medications on File Prior to Visit  Medication Sig Dispense Refill   albuterol  (VENTOLIN  HFA) 108 (90 Base) MCG/ACT inhaler Inhale 2 puffs into the lungs every  6 (six) hours as needed for wheezing or shortness of breath. 8 g 2   BIOTIN PO Take 1 tablet by mouth daily.     buPROPion  (WELLBUTRIN  XL) 300 MG 24 hr tablet TAKE 1 TABLET(300 MG) BY MOUTH DAILY 90 tablet 2   Cholecalciferol  (VITAMIN D3) 25 MCG (1000 UT) CAPS Take 1 capsule (1,000 Units total) by mouth daily. 60 capsule    citalopram  (CELEXA ) 40 MG tablet TAKE 1 TABLET(40 MG) BY MOUTH DAILY (Patient taking differently: Take 40 mg by mouth at bedtime.) 90 tablet 1   Erenumab -aooe (AIMOVIG ) 140 MG/ML SOAJ Inject 140 mg as directed every 28 (twenty-eight) days. ADMINISTER 1 ML(140MG ) UNDER THE SKIN EVERY 30 DAYS 1 mL 5   HYDROcodone -acetaminophen  (NORCO/VICODIN) 5-325 MG tablet Take 0.5-1 tablets by mouth every 8 (eight) hours as needed for moderate pain (pain score 4-6).  0   LINZESS 72 MCG capsule Take 72 mcg by mouth every 3 (three) days.     NUVARING 0.12-0.015 MG/24HR vaginal ring Place 1 each vaginally every 28 (twenty-eight) days.     omeprazole  (PRILOSEC) 40 MG capsule Take 1 capsule (40 mg total) by mouth daily. 90 capsule 3   ondansetron  (ZOFRAN -ODT) 4 MG disintegrating tablet DISSOLVE 1 TABLET(4 MG) ON THE TONGUE EVERY 8 HOURS AS NEEDED FOR NAUSEA OR VOMITING 20 tablet 3   Rimegepant Sulfate (NURTEC) 75 MG TBDP Take 1 tablet (75 mg total) by mouth daily as needed.  8 tablet 11   tirzepatide (MOUNJARO) 7.5 MG/0.5ML Pen Inject 7.5 mg into the skin once a week.     tiZANidine  (ZANAFLEX ) 4 MG tablet TAKE 1 TABLET(4 MG) BY MOUTH EVERY 6 HOURS AS NEEDED FOR MUSCLE SPASMS 30 tablet 5   Vitamin D -Vitamin K (VITAMIN D2 + K1 PO) Take 1 tablet by mouth daily.     No current facility-administered medications on file prior to visit.     Review of Systems  Constitutional:  Positive for fatigue.  Respiratory:  Negative for shortness of breath.   Cardiovascular:  Negative for chest pain, palpitations and leg swelling.  Neurological:  Positive for dizziness (yesterday) and light-headedness  (yesterday). Negative for weakness, numbness and headaches.       Objective:   Vitals:   05/11/24 0751  BP: 108/72  Pulse: 72  Temp: 98.4 F (36.9 C)  SpO2: 98%   BP Readings from Last 3 Encounters:  05/11/24 108/72  04/28/24 106/64  03/17/24 112/72   Wt Readings from Last 3 Encounters:  05/11/24 136 lb (61.7 kg)  04/27/24 139 lb 14.4 oz (63.5 kg)  03/17/24 136 lb (61.7 kg)   Body mass index is 22.63 kg/m.    Physical Exam Constitutional:      General: She is not in acute distress.    Appearance: Normal appearance.  HENT:     Head: Normocephalic and atraumatic.  Eyes:     Conjunctiva/sclera: Conjunctivae normal.  Cardiovascular:     Rate and Rhythm: Normal rate and regular rhythm.     Heart sounds: Normal heart sounds.  Pulmonary:     Effort: Pulmonary effort is normal. No respiratory distress.     Breath sounds: Normal breath sounds. No wheezing.  Musculoskeletal:     Cervical back: Neck supple.     Right lower leg: No edema.     Left lower leg: No edema.  Lymphadenopathy:     Cervical: No cervical adenopathy.  Skin:    General: Skin is warm and dry.     Findings: No rash.  Neurological:     Mental Status: She is alert. Mental status is at baseline.  Psychiatric:        Mood and Affect: Mood normal.        Behavior: Behavior normal.        Lab Results  Component Value Date   WBC 10.8 (H) 04/27/2024   HGB 13.0 04/27/2024   HCT 39.9 04/27/2024   PLT 361 04/27/2024   GLUCOSE 81 04/27/2024   CHOL 172 04/28/2024   TRIG 79 04/28/2024   HDL 56 04/28/2024   LDLCALC 100 (H) 04/28/2024   ALT 10 04/27/2024   AST 19 04/27/2024   NA 138 04/27/2024   K 3.7 04/27/2024   CL 107 04/27/2024   CREATININE 0.97 04/27/2024   BUN 16 04/27/2024   CO2 23 04/27/2024   TSH 3.75 07/25/2023   INR 1.0 04/27/2024   HGBA1C 4.3 (L) 04/28/2024   MR BRAIN WO CONTRAST CLINICAL DATA:  Initial evaluation for acute neuro deficit, stroke suspected.  EXAM: MRI HEAD  WITHOUT CONTRAST  TECHNIQUE: Multiplanar, multiecho pulse sequences of the brain and surrounding structures were obtained without intravenous contrast.  COMPARISON:  Prior CT from earlier the same day as well as brain MRI from 07/09/2019.  FINDINGS: Brain: Cerebral volume within normal limits for age. No focal parenchymal signal abnormality.  No abnormal foci of restricted diffusion to suggest acute or subacute ischemia. Gray-white matter differentiation well maintained.  No encephalomalacia to suggest chronic cortical infarction or other insult. No foci of susceptibility artifact indicative of acute or chronic intracranial blood products.  No mass lesion, midline shift or mass effect. Ventricles normal in size and morphology without hydrocephalus. No extra-axial fluid collection.  Pituitary gland and suprasellar region within normal limits.  Vascular: Major intracranial vascular flow voids are well maintained.  Skull and upper cervical spine: Craniocervical junction within normal limits. Visualized upper cervical spine demonstrates no significant finding. Bone marrow signal intensity within normal limits. No scalp soft tissue abnormality.  Sinuses/Orbits: Globes and orbital soft tissues are within normal limits.  Paranasal sinuses are largely clear. No significant mastoid effusion.  Other: None.  IMPRESSION: Stable normal brain MRI.  No acute intracranial abnormality.  Electronically Signed   By: Virgia Griffins M.D.   On: 04/28/2024 18:31 ECHOCARDIOGRAM COMPLETE    ECHOCARDIOGRAM REPORT       Patient Name:   CYBELE MAULE Date of Exam: 04/28/2024 Medical Rec #:  [id number]    Height:       65.0 in Accession #:    [id number]   Weight:       139.9 lb Date of Birth:  1971/10/09    BSA:          1.699 m Patient Age:    53 years     BP:           102/70 mmHg Patient Gender: F            HR:           80 bpm. Exam Location:  Inpatient  Procedure: 2D Echo,  Cardiac Doppler, Color Doppler and Saline Contrast Bubble            Study (Both Spectral and Color Flow Doppler were utilized during            procedure).  Indications:    TIA   History:        Patient has no prior history of Echocardiogram examinations.   Sonographer:    Juanita Shaw Referring Phys: [id number] SRISHTI L BHAGAT  IMPRESSIONS   1. Left ventricular ejection fraction, by estimation, is 60 to 65%. The left ventricle has normal function. The left ventricle has no regional wall motion abnormalities. Left ventricular diastolic parameters were normal.  2. Right ventricular systolic function is normal. The right ventricular size is normal.  3. The mitral valve is normal in structure. No evidence of mitral valve regurgitation. No evidence of mitral stenosis.  4. The aortic valve is normal in structure. Aortic valve regurgitation is not visualized. No aortic stenosis is present.  5. The inferior vena cava is normal in size with greater than 50% respiratory variability, suggesting right atrial pressure of 3 mmHg.  FINDINGS  Left Ventricle: Left ventricular ejection fraction, by estimation, is 60 to 65%. The left ventricle has normal function. The left ventricle has no regional wall motion abnormalities. The left ventricular internal cavity size was normal in size. There is  no left ventricular hypertrophy. Left ventricular diastolic parameters were normal.  Right Ventricle: The right ventricular size is normal. No increase in right ventricular wall thickness. Right ventricular systolic function is normal.  Left Atrium: Left atrial size was normal in size.  Right Atrium: Right atrial size was normal in size.  Pericardium: There is no evidence of pericardial effusion.  Mitral Valve: The mitral valve is normal in structure. No evidence of mitral valve regurgitation. No evidence of mitral valve stenosis. MV  peak gradient, 1.5 mmHg. The mean mitral valve gradient is 1.0  mmHg.  Tricuspid Valve: The tricuspid valve is normal in structure. Tricuspid valve regurgitation is not demonstrated. No evidence of tricuspid stenosis.  Aortic Valve: The aortic valve is normal in structure. Aortic valve regurgitation is not visualized. No aortic stenosis is present. Aortic valve mean gradient measures 3.0 mmHg. Aortic valve peak gradient measures 6.0 mmHg. Aortic valve area, by VTI  measures 1.82 cm.  Pulmonic Valve: The pulmonic valve was normal in structure. Pulmonic valve regurgitation is not visualized. No evidence of pulmonic stenosis.  Aorta: The aortic root is normal in size and structure.  Venous: The inferior vena cava is normal in size with greater than 50% respiratory variability, suggesting right atrial pressure of 3 mmHg.  IAS/Shunts: No atrial level shunt detected by color flow Doppler. Agitated saline contrast was given intravenously to evaluate for intracardiac shunting.    LEFT VENTRICLE PLAX 2D LVIDd:         3.90 cm     Diastology LVIDs:         2.80 cm     LV e' medial:    10.10 cm/s LV PW:         0.70 cm     LV E/e' medial:  5.8 LV IVS:        0.80 cm     LV e' lateral:   11.90 cm/s LVOT diam:     1.70 cm     LV E/e' lateral: 4.9 LV SV:         39 LV SV Index:   23 LVOT Area:     2.27 cm   LV Volumes (MOD) LV vol d, MOD A2C: 68.3 ml LV vol d, MOD A4C: 70.2 ml LV vol s, MOD A2C: 20.7 ml LV vol s, MOD A4C: 29.2 ml LV SV MOD A2C:     47.6 ml LV SV MOD A4C:     70.2 ml LV SV MOD BP:      45.2 ml  RIGHT VENTRICLE             IVC RV Basal diam:  3.00 cm     IVC diam: 1.10 cm RV Mid diam:    2.10 cm RV S prime:     12.40 cm/s TAPSE (M-mode): 1.8 cm  LEFT ATRIUM             Index        RIGHT ATRIUM          Index LA diam:        2.20 cm 1.29 cm/m   RA Area:     8.65 cm LA Vol (A2C):   22.1 ml 13.00 ml/m  RA Volume:   16.80 ml 9.89 ml/m LA Vol (A4C):   6.2 ml  3.65 ml/m LA Biplane Vol: 11.8 ml 6.94 ml/m  AORTIC VALVE                     PULMONIC VALVE AV Area (Vmax):    1.92 cm     PV Vmax:       1.01 m/s AV Area (Vmean):   1.56 cm     PV Peak grad:  4.1 mmHg AV Area (VTI):     1.82 cm AV Vmax:           122.00 cm/s AV Vmean:          82.300 cm/s AV VTI:  0.213 m AV Peak Grad:      6.0 mmHg AV Mean Grad:      3.0 mmHg LVOT Vmax:         103.00 cm/s LVOT Vmean:        56.600 cm/s LVOT VTI:          0.171 m LVOT/AV VTI ratio: 0.80   AORTA Ao Root diam: 2.90 cm Ao Asc diam:  2.60 cm  MITRAL VALVE MV Area (PHT): 3.13 cm    SHUNTS MV Area VTI:   3.18 cm    Systemic VTI:  0.17 m MV Peak grad:  1.5 mmHg    Systemic Diam: 1.70 cm MV Mean grad:  1.0 mmHg MV Vmax:       0.62 m/s MV Vmean:      45.2 cm/s MV Decel Time: 242 msec MV E velocity: 58.20 cm/s MV A velocity: 68.10 cm/s MV E/A ratio:  0.85  Mihai Croitoru MD Electronically signed by Luana Rumple MD Signature Date/Time: 04/28/2024/2:56:18 PM      Final   CT ANGIO HEAD NECK W WO CM CLINICAL DATA:  Stroke, determine embolic source  EXAM: CT ANGIOGRAPHY HEAD AND NECK WITH AND WITHOUT CONTRAST  TECHNIQUE: Multidetector CT imaging of the head and neck was performed using the standard protocol during bolus administration of intravenous contrast. Multiplanar CT image reconstructions and MIPs were obtained to evaluate the vascular anatomy. Carotid stenosis measurements (when applicable) are obtained utilizing NASCET criteria, using the distal internal carotid diameter as the denominator.  RADIATION DOSE REDUCTION: This exam was performed according to the departmental dose-optimization program which includes automated exposure control, adjustment of the mA and/or kV according to patient size and/or use of iterative reconstruction technique.  CONTRAST:  75mL OMNIPAQUE  IOHEXOL  350 MG/ML SOLN  COMPARISON:  Yesterday  FINDINGS: CT HEAD FINDINGS  Brain: No evidence of acute infarction, hemorrhage, hydrocephalus, extra-axial  collection or mass lesion/mass effect.  Vascular: No hyperdense vessel or unexpected calcification.  Skull: Normal. Negative for fracture or focal lesion.  Sinuses/Orbits: No acute finding.  Review of the MIP images confirms the above findings  CTA NECK FINDINGS  Aortic arch: Mild atheromatous plaque.  Right carotid system: Mild atheromatous plaque at the proximal ICA. No ulceration or flow reducing stenosis  Left carotid system: Mild atheromatous plaque centered at the bifurcation. No stenosis or ulceration  Vertebral arteries: No proximal subclavian or vertebral stenosis.  Skeleton: No acute or aggressive finding  Other neck: Unremarkable  Upper chest: Clear apical lungs  Review of the MIP images confirms the above findings  CTA HEAD FINDINGS  Anterior circulation: No significant stenosis, proximal occlusion, aneurysm, or vascular malformation.  Posterior circulation: No significant stenosis, proximal occlusion, aneurysm, or vascular malformation.  Venous sinuses: As permitted by contrast timing, patent.  Anatomic variants: None significant  Review of the MIP images confirms the above findings  IMPRESSION: No emergent finding.  Mild cervical carotid atherosclerosis. No stenosis or irregularity of major arteries in the head and neck.  Electronically Signed   By: Ronnette Coke M.D.   On: 04/28/2024 07:09    Assessment & Plan:    See Problem List for Assessment and Plan of chronic medical problems.

## 2024-05-10 NOTE — Patient Instructions (Addendum)
      Blood work was ordered.       Medications changes include :   None    A referral was ordered and someone will call you to schedule an appointment.     Return in about 6 months (around 11/10/2024) for follow up.

## 2024-05-11 ENCOUNTER — Ambulatory Visit (INDEPENDENT_AMBULATORY_CARE_PROVIDER_SITE_OTHER): Admitting: Internal Medicine

## 2024-05-11 VITALS — BP 108/72 | HR 72 | Temp 98.4°F | Ht 65.0 in | Wt 136.0 lb

## 2024-05-11 DIAGNOSIS — F419 Anxiety disorder, unspecified: Secondary | ICD-10-CM

## 2024-05-11 DIAGNOSIS — G43109 Migraine with aura, not intractable, without status migrainosus: Secondary | ICD-10-CM | POA: Diagnosis not present

## 2024-05-11 DIAGNOSIS — F5104 Psychophysiologic insomnia: Secondary | ICD-10-CM | POA: Diagnosis not present

## 2024-05-11 DIAGNOSIS — E7849 Other hyperlipidemia: Secondary | ICD-10-CM

## 2024-05-11 DIAGNOSIS — F32A Depression, unspecified: Secondary | ICD-10-CM

## 2024-05-11 MED ORDER — TRAZODONE HCL 150 MG PO TABS: 150.0000 mg | ORAL_TABLET | Freq: Every day | ORAL | 1 refills | Status: AC

## 2024-05-11 NOTE — Assessment & Plan Note (Signed)
 Chronic Difficulty falling asleep and staying asleep  Sleep is interrupted which is affecting her overall quality of sleep and is likely part of the reason why she is fatigued Her chronic back pain is likely contributing as well Continue Trazodone  150 mg nightly Continue magnesium supplement

## 2024-05-11 NOTE — Assessment & Plan Note (Signed)
Chronic Overall controlled Continue citalopram 40 mg daily and bupropion XL 300 mg daily

## 2024-05-11 NOTE — Assessment & Plan Note (Signed)
 Chronic Last LDL 100 Started on atorvastatin  in the hospital 80 mg daily for "secondary" prevention of CVA, but she did not have a CVA-symptoms related to complicated migraine She did not pick up the prescription and does not take the atorvastatin  Healthy diet Will monitor lipid panel

## 2024-05-11 NOTE — Assessment & Plan Note (Signed)
 New Had a complicated migraine 5/19 that presented like a stroke Stroke ruled out No prior episodes No migraine since then Triptan stopped-started on Nurtec as needed Has follow-up with Dr. Festus Hubert

## 2024-05-29 ENCOUNTER — Encounter: Payer: Self-pay | Admitting: Internal Medicine

## 2024-06-25 ENCOUNTER — Ambulatory Visit: Admitting: Emergency Medicine

## 2024-06-25 ENCOUNTER — Encounter: Payer: Self-pay | Admitting: Internal Medicine

## 2024-06-25 VITALS — BP 100/70 | HR 79 | Temp 98.4°F | Ht 65.0 in | Wt 137.5 lb

## 2024-06-25 DIAGNOSIS — R300 Dysuria: Secondary | ICD-10-CM | POA: Diagnosis not present

## 2024-06-25 LAB — POCT URINALYSIS DIP (CLINITEK)
Blood, UA: NEGATIVE
Glucose, UA: NEGATIVE mg/dL
Ketones, POC UA: NEGATIVE mg/dL
Leukocytes, UA: NEGATIVE
Nitrite, UA: NEGATIVE
Spec Grav, UA: 1.02 (ref 1.010–1.025)
Urobilinogen, UA: 0.2 U/dL
pH, UA: 6 (ref 5.0–8.0)

## 2024-06-25 MED ORDER — CEFUROXIME AXETIL 250 MG PO TABS: 250.0000 mg | ORAL_TABLET | Freq: Two times a day (BID) | ORAL | 0 refills | Status: AC

## 2024-06-25 NOTE — Patient Instructions (Signed)
 Dysuria Dysuria is pain or discomfort when you pee. The pain may be felt in your urethra, which is the part of your body that drains pee (urine) from your bladder. The pain may also be felt near your genitals, groin, or in your lower belly or back. You may have to pee often or have the sudden feeling that you need to pee. This condition can affect anyone, but it's more common in females. It can be caused by: A urinary tract infection (UTI). Kidney stones or bladder stones. Some sexually transmitted infections (STIs). Dehydration. This is when there's not enough water in your body. Irritation and swelling in the vagina. The use of some medicines. The use of some soaps or products with a scent. Follow these instructions at home: Medicines  Take your medicines only as told. Take your antibiotics as told. Do not stop taking them even if you start to feel better. Eating and drinking Drink enough fluid to keep your pee pale yellow. Certain drinks can make the pain worse. Avoid: Drinks with caffeine in them. Tea. Alcohol. In males, alcohol may irritate the prostate. General instructions Watch your condition for any changes, such as color changes in your pee. Pee often. Do not hold your pee for a long time. If you're female, wipe from front to back after you pee or poop. Use each tissue only once when you wipe. Pee after you have sex. If you've had any tests done, it's up to you to get your test results. Ask your health care provider, or the department doing the test, when your results will be ready. Contact a health care provider if: You have a fever or chills. You have pain in your back or sides. You throw up or feel like you may throw up. You have blood in your pee. You're not peeing as often as normal. You feel very weak. Get help right away if: You have very bad pain that doesn't get better with medicine. You're confused. You have a fast heartbeat while resting. This information is  not intended to replace advice given to you by your health care provider. Make sure you discuss any questions you have with your health care provider. Document Revised: 04/02/2023 Document Reviewed: 04/02/2023 Elsevier Patient Education  2024 [employer].

## 2024-06-25 NOTE — Assessment & Plan Note (Signed)
 Clinically stable.  No red flag signs or symptoms Patient has history of interstitial cystitis Over-the-counter Azo medications makes symptoms worse No signs of pyelonephritis today. Unremarkable urinalysis.  Urine sent out for culture. Recommend to start Ceftin  250 mg twice a day for 7 days or at least until cultures are back. ED precautions given Advised to rest and stay well-hydrated Advised to follow-up with PCP if no better or or worse next week.

## 2024-06-25 NOTE — Progress Notes (Signed)
 Teresa Campbell 53 y.o.   Chief Complaint  Patient presents with   Urinary Tract Infection    Abdominal pressure, frequent urinating, burning sensation (not during urinating), cramping. Been going on for a week now     HISTORY OF PRESENT ILLNESS: This is a 53 y.o. female with history of interstitial cystitis complaining of urinary frequency with burning and lower abdominal discomfort for the started about a week ago. No other associated symptoms.  Denies fever or chills, denies flank pain or gross hematuria.  Occasionally nauseous but no vomiting.  Urinary Tract Infection  Associated symptoms include frequency and nausea. Pertinent negatives include no chills, flank pain or hematuria.     Prior to Admission medications   Medication Sig Start Date End Date Taking? Authorizing Provider  albuterol  (VENTOLIN  HFA) 108 (90 Base) MCG/ACT inhaler Inhale 2 puffs into the lungs every 6 (six) hours as needed for wheezing or shortness of breath. 07/09/23  Yes Burns, Glade PARAS, MD  BIOTIN PO Take 1 tablet by mouth daily.   Yes [provider]  buPROPion  (WELLBUTRIN  XL) 300 MG 24 hr tablet TAKE 1 TABLET(300 MG) BY MOUTH DAILY 04/15/24  Yes Burns, Glade PARAS, MD  Cholecalciferol  (VITAMIN D3) 25 MCG (1000 UT) CAPS Take 1 capsule (1,000 Units total) by mouth daily. 03/28/21  Yes Burns, Glade PARAS, MD  citalopram  (CELEXA ) 40 MG tablet TAKE 1 TABLET(40 MG) BY MOUTH DAILY Patient taking differently: Take 40 mg by mouth at bedtime. 08/26/23  Yes Burns, Glade PARAS, MD  Erenumab -aooe (AIMOVIG ) 140 MG/ML SOAJ Inject 140 mg as directed every 28 (twenty-eight) days. ADMINISTER 1 ML(140MG ) UNDER THE SKIN EVERY 30 DAYS 01/13/24  Yes Skeet, Adam R, DO  HYDROcodone -acetaminophen  (NORCO/VICODIN) 5-325 MG tablet Take 0.5-1 tablets by mouth every 8 (eight) hours as needed for moderate pain (pain score 4-6). 09/27/18  Yes [provider]  LINZESS 72 MCG capsule Take 72 mcg by mouth every 3 (three) days. 04/16/24  Yes  [provider]  NUVARING 0.12-0.015 MG/24HR vaginal ring Place 1 each vaginally every 28 (twenty-eight) days. 09/26/18  Yes [provider]  omeprazole  (PRILOSEC) 40 MG capsule Take 1 capsule (40 mg total) by mouth daily. 12/15/18  Yes Jason Leita Repine, FNP  ondansetron  (ZOFRAN -ODT) 4 MG disintegrating tablet DISSOLVE 1 TABLET(4 MG) ON THE TONGUE EVERY 8 HOURS AS NEEDED FOR NAUSEA OR VOMITING 12/06/23  Yes Skeet, Adam R, DO  Rimegepant Sulfate (NURTEC) 75 MG TBDP Take 1 tablet (75 mg total) by mouth daily as needed. 04/29/24  Yes Jaffe, Adam R, DO  tirzepatide [facility]) 7.5 MG/0.5ML Pen Inject 7.5 mg into the skin once a week.   Yes [provider]  tiZANidine  (ZANAFLEX ) 4 MG tablet TAKE 1 TABLET(4 MG) BY MOUTH EVERY 6 HOURS AS NEEDED FOR MUSCLE SPASMS 01/28/24  Yes Jaffe, Adam R, DO  traZODone  (DESYREL ) 150 MG tablet Take 1 tablet (150 mg total) by mouth at bedtime. 05/11/24  Yes Burns, Glade PARAS, MD  Vitamin D -Vitamin K (VITAMIN D2 + K1 PO) Take 1 tablet by mouth daily.    [provider]    Allergies  Allergen Reactions   Cymbalta [Duloxetine Hcl]     Ineffective   Lyrica [Pregabalin] Other (See Comments)    Ineffective   Tramadol     Ineffective     Patient Active Problem List   Diagnosis Date Noted   Complicated migraine 04/28/2024   CVA (cerebral vascular accident) (HCC) 04/27/2024   Urinary frequency 07/25/2023   Bladder  spasms 07/25/2023   COVID-19 07/09/2023   Menopausal symptoms 10/23/2022   Dizziness 10/23/2022   Hyperlipidemia 04/04/2022   Onychomycosis 03/28/2021   CKD (chronic kidney disease) stage 3, GFR 30-59 ml/min (HCC) 03/28/2021   IC (interstitial cystitis) 03/28/2021   Fibromyalgia 03/28/2021   History of endometriosis 03/28/2021   IBS (irritable bowel syndrome) 03/28/2021   Chronic back pain 03/28/2021   Chronic migraine without aura without status migrainosus, not intractable 11/03/2018   Anxiety and depression  11/03/2018   Chronic insomnia 11/03/2018   Spondylosis without myelopathy or radiculopathy, thoracic region 02/03/2018   Hiatal hernia with GERD without esophagitis 11/25/2017   Spondylosis without myelopathy or radiculopathy, cervical region 07/03/2016   Neck pain 12/27/2015    Past Medical History:  Diagnosis Date   Colon polyps    Depression    Fibromyalgia    GERD (gastroesophageal reflux disease)    Hemorrhoids    Insomnia    Migraine     History reviewed. No pertinent surgical history.  Social History   Socioeconomic History   Marital status: Married    Spouse name: Reyes   Number of children: 1   Years of education: Not on file   Highest education level: Bachelor's degree (e.g., BA, AB, BS)  Occupational History   Occupation: Risk manager: [employer]  Tobacco Use   Smoking status: Former   Smokeless tobacco: Never  Advertising account planner   Vaping status: Never Used  Substance and Sexual Activity   Alcohol use: Yes   Drug use: Not on file   Sexual activity: Yes    Birth control/protection: Inserts  Other Topics Concern   Not on file  Social History Narrative   Patient is right-handed. She lives with her husband in a one level home with a basement. She drinks one cup of coffee a day. She goes to [employer] daily.   Social Drivers of Corporate investment banker Strain: Low Risk  (06/25/2024)   Overall Financial Resource Strain (CARDIA)    Difficulty of Paying Living Expenses: Not hard at all  Food Insecurity: No Food Insecurity (06/25/2024)   Hunger Vital Sign    Worried About Running Out of Food in the Last Year: Never true    Ran Out of Food in the Last Year: Never true  Transportation Needs: No Transportation Needs (06/25/2024)   PRAPARE - Administrator, Civil Service (Medical): No    Lack of Transportation (Non-Medical): No  Physical Activity: Insufficiently Active (06/25/2024)   Exercise Vital Sign    Days of Exercise  per Week: 2 days    Minutes of Exercise per Session: 20 min  Stress: No Stress Concern Present (06/25/2024)   [employer] of Occupational Health - Occupational Stress Questionnaire    Feeling of Stress: Not at all  Social Connections: Moderately Isolated (06/25/2024)   Social Connection and Isolation Panel    Frequency of Communication with Friends and Family: More than three times a week    Frequency of Social Gatherings with Friends and Family: Once a week    Attends Religious Services: Never    Database administrator or Organizations: No    Attends Engineer, structural: Not on file    Marital Status: Married  Catering manager Violence: Not At Risk (04/28/2024)   Humiliation, Afraid, Rape, and Kick questionnaire    Fear of Current or Ex-Partner: No    Emotionally Abused: No    Physically Abused: No  Sexually Abused: No    Family History  Problem Relation Age of Onset   Arthritis Mother    COPD Mother    Depression Mother    Hyperlipidemia Mother    Miscarriages / [place] Mother    Arthritis Father    Depression Father    Heart disease Father    Hyperlipidemia Father    Alcohol abuse Sister    Depression Sister    Drug abuse Sister    Hyperlipidemia Sister    Hypertension Sister    Alcohol abuse Brother    Depression Brother    Drug abuse Brother    Depression Daughter    Alcohol abuse Maternal Grandmother    Depression Maternal Grandmother    Stroke Maternal Grandfather    Arthritis Paternal Grandmother    Diabetes Paternal Grandmother    Stroke Paternal Grandfather    Alcohol abuse Sister    Depression Sister    Drug abuse Sister    Depression Brother      Review of Systems  Constitutional:  Negative for chills and fever.  HENT: Negative.  Negative for congestion and sore throat.   Respiratory: Negative.  Negative for cough and shortness of breath.   Cardiovascular: Negative.  Negative for chest pain and palpitations.  Gastrointestinal:   Positive for abdominal pain and nausea.  Genitourinary:  Positive for dysuria and frequency. Negative for flank pain and hematuria.  Skin: Negative.  Negative for rash.  Neurological:  Negative for dizziness and headaches.  All other systems reviewed and are negative.   Vitals:   06/25/24 1043  BP: 100/70  Pulse: 79  Temp: 98.4 F (36.9 C)  SpO2: 99%    Physical Exam Vitals reviewed.  Constitutional:      Appearance: Normal appearance.  HENT:     Head: Normocephalic.  Eyes:     Extraocular Movements: Extraocular movements intact.  Cardiovascular:     Rate and Rhythm: Normal rate.  Pulmonary:     Effort: Pulmonary effort is normal.  Abdominal:     Palpations: Abdomen is soft.     Tenderness: There is no abdominal tenderness. There is no right CVA tenderness or left CVA tenderness.  Skin:    General: Skin is warm and dry.     Capillary Refill: Capillary refill takes less than 2 seconds.  Neurological:     General: No focal deficit present.     Mental Status: She is alert and oriented to person, place, and time.  Psychiatric:        Mood and Affect: Mood normal.        Behavior: Behavior normal.    Results for orders placed or performed in visit on 06/25/24 (from the past 24 hours)  POCT URINALYSIS DIP (CLINITEK)     Status: Abnormal   Collection Time: 06/25/24 11:12 AM  Result Value Ref Range   Color, UA     Clarity, UA     Glucose, UA negative negative mg/dL   Bilirubin, UA small (A) negative   Ketones, POC UA negative negative mg/dL   Spec Grav, UA 8.979 8.989 - 1.025   Blood, UA negative negative   pH, UA 6.0 5.0 - 8.0   POC PROTEIN,UA trace negative, trace   Urobilinogen, UA 0.2 0.2 or 1.0 E.U./dL   Nitrite, UA Negative Negative   Leukocytes, UA Negative Negative     ASSESSMENT & PLAN: A total of 33 minutes was spent with the patient and counseling/coordination of care regarding preparing for  this visit, review of most recent office visit notes, review  of all medications and chronic medical conditions under management, diagnosis of possible urinary tract infection and need for antibiotics, prognosis, ED precautions, documentation and need for follow-up if no better or worse during the next several days.  Problem List Items Addressed This Visit       Other   Dysuria - Primary   Clinically stable.  No red flag signs or symptoms Patient has history of interstitial cystitis Over-the-counter Azo medications makes symptoms worse No signs of pyelonephritis today. Unremarkable urinalysis.  Urine sent out for culture. Recommend to start Ceftin  250 mg twice a day for 7 days or at least until cultures are back. ED precautions given Advised to rest and stay well-hydrated Advised to follow-up with PCP if no better or or worse next week.      Relevant Medications   cefUROXime  (CEFTIN ) 250 MG tablet   Other Relevant Orders   POCT URINALYSIS DIP (CLINITEK) (Completed)   Urine Culture   Patient Instructions  Dysuria Dysuria is pain or discomfort when you pee. The pain may be felt in your urethra, which is the part of your body that drains pee (urine) from your bladder. The pain may also be felt near your genitals, groin, or in your lower belly or back. You may have to pee often or have the sudden feeling that you need to pee. This condition can affect anyone, but it's more common in females. It can be caused by: A urinary tract infection (UTI). Kidney stones or bladder stones. Some sexually transmitted infections (STIs). Dehydration. This is when there's not enough water in your body. Irritation and swelling in the vagina. The use of some medicines. The use of some soaps or products with a scent. Follow these instructions at home: Medicines  Take your medicines only as told. Take your antibiotics as told. Do not stop taking them even if you start to feel better. Eating and drinking Drink enough fluid to keep your pee pale yellow. Certain  drinks can make the pain worse. Avoid: Drinks with caffeine in them. Tea. Alcohol. In males, alcohol may irritate the prostate. General instructions Watch your condition for any changes, such as color changes in your pee. Pee often. Do not hold your pee for a long time. If you're female, wipe from front to back after you pee or poop. Use each tissue only once when you wipe. Pee after you have sex. If you've had any tests done, it's up to you to get your test results. Ask your health care provider, or the department doing the test, when your results will be ready. Contact a health care provider if: You have a fever or chills. You have pain in your back or sides. You throw up or feel like you may throw up. You have blood in your pee. You're not peeing as often as normal. You feel very weak. Get help right away if: You have very bad pain that doesn't get better with medicine. You're confused. You have a fast heartbeat while resting. This information is not intended to replace advice given to you by your health care provider. Make sure you discuss any questions you have with your health care provider. Document Revised: 04/02/2023 Document Reviewed: 04/02/2023 Elsevier Patient Education  2024 Elsevier Inc.    Emil Schaumann, MD [facility]

## 2024-06-26 LAB — URINE CULTURE: Result:: NO GROWTH

## 2024-07-07 ENCOUNTER — Encounter: Payer: Self-pay | Admitting: Neurology

## 2024-07-07 ENCOUNTER — Other Ambulatory Visit: Payer: Self-pay

## 2024-07-07 MED ORDER — NURTEC 75 MG PO TBDP: 1.0000 | ORAL_TABLET | Freq: Every day | ORAL | 11 refills | Status: AC | PRN

## 2024-07-07 NOTE — Telephone Encounter (Signed)
 Per patient mychart message, For some reason [facility] says my Nurtec prescription is no longer with their store. I have no idea why, but they are requesting that they received a new prescription sent to them. Its the [facility] on [address] in [place].  Could you send them over a new prescription?    Spoke to the pharmacy someone transferred the script to [facility].  Advised rep no one at the office to call to transfer.   It will take a while for the medication to be transferred back.    New script sent.

## 2024-07-10 NOTE — Progress Notes (Deleted)
 NEUROLOGY FOLLOW UP OFFICE NOTE  Teresa Campbell [id number]  Assessment/Plan:     Migraine with aura, without status migrainosus, not intractable Hemiplegic migraine     Migraine prevention:  Aimovig  140mg  every 28 days Migraine rescue:  Nurtec for severe migraines; ibuprofen with tizanidine  for moderate/cervicogenic headaches.  Zofran  ODT for nausea.  Limit use of pain relievers to no more than 9 days out of the month to prevent risk of rebound or medication-overuse headache. Keep headache diary Follow up 6 months    Subjective:  Teresa Campbell is a 53 year old right-handed woman with fibromyalgia and depression who follows up for migraines and new left upper extremity pain.   UPDATE: Hemiplegic migraine: She had what was diagnosed as a hemiplegic migraine in May.  No prior history.  On 5/19, she developed left upper and lower extremity numbness, tingling and weakness accompanied by headache.  Admitted to [facility] where she received TNKase .  MRI of brain was negative for stroke.  CTA head and neck revealed no LVO or high-grade stenosis.  2D echo showed EF 60-65% with negative bubble study.  LDL was 158 and Hgb A1c.  She was started on Lipitor  80mg  daily.  As stroke was ruled out, she was not discharged on antithrombotic therapy.  She has not had a recurrent episode.  ***  Typical migraines: They have been ***.   Due to having a hemiplegic migraine, rizatriptan  was discontinued.  She tried Nurtec *** Intensity:  Moderate to severe Duration: *** with Nurtec *** Frequency:  7 to 8 a month on average.     Rescue:  For moderate:  Tizanidine , ibuprofen and ice; For severe:  Rizatriptan  Current NSAIDS: Excedrin Migraine Current analgesics: hydrocodone  (fibromyalgia), ketamine (chronic pain) Current triptans: None Current ergotamine:  none Current anti-emetic:  ondansetron  ODT 4mg   Current muscle relaxants:  tizanidine  2mg  PRN (muscle spasms) Current anti-anxiolytic:   none Current sleep aide: Trazodone  Current Antihypertensive medications:  none Current Antidepressant medications: citalopram  40 mg daily, bupropion  150 mg Current Anticonvulsant medications: none Current anti-CGRP:  Aimovig  140mg  Current Vitamins/Herbal/Supplements:  B12 Current Antihistamines/Decongestants:  meclizine Other therapy:  cervical radiofrequency ablations; PT neck; ketamine infusions for fibromyalgia, cold compress, heating pad. Hormone/birth control: NuvaRing   Caffeine:  1 cup of coffee daily Diet:  Drinks water all day.  Eats healthy Exercise:  routine Depression:  yes; Anxiety:  yes Other pain:  fibromyalgia, lumbar radiculopathy Sleep hygiene:  Okay with trazodone  and exercise     HISTORY:  Migraines: Onset:  Childhood.  Missed school as a child.  Worse after starting her menstrual cycle. Location:  Bi-frontal Quality:  Pounding Initial Intensity:  Severe.  She denies new headache, thunderclap headache or severe headache that wakes her from sleep. Aura:  Flashing lights/blue colors Prodrome:  none Postdrome:  fatigue Associated symptoms:  Photophobia, phonophobia, nausea, osmophobia, sometimes vomiting.  She denies associated unilateral numbness or weakness. Initial Duration:  2 days with Maxalt  (1 hour if taken at onset) Initial Frequency:  2 migraines past 30 days (15-17 a month prior to topiramate ) Initial Frequency of abortive medication: 2 days past month Triggers: increased depression Relieving factors:  Ice pack on head and heat to back of neck Activity:  aggravates    Past NSAIDS:  Ibuprofen (kidney problems), naproxen, Toradol  shot Past analgesics:  Tylenol , Excedrin Past abortive triptans: Zomig, sumatriptan tablet, rizatriptan .  CONTRAINDICATED due to hemiplegic migraine Past abortive ergotamine:  none Past muscle relaxants:  none Past anti-emetic:  none Past antihypertensive medications:  none Past antidepressant medications:  Amitriptyline  25mg  Past anticonvulsant medications:  Topiramate  100 mg twice daily (on higher doses she had hair thinning, weight loss), gabapentin Past anti-CGRP:  Nurtec Past vitamins/Herbal/Supplements:  none Past antihistamines/decongestants:  none Other past therapies:  Botox (effective.  However, she was having trouble getting the Botox due to cost), Gamacor vagus nerve stimulator (abortive treatment, effective), cervical nerve ablations.   Family history of headache:  Father, paternal grandfather, 2 aunts   Transient Spells: In June 2020, she was in the grocery store when she started feeling dizzy.  She couldn't hear what the lady at check out was saying.  Everything was cloudy and her legs felt they were going to buckle.  She felt hot and diaphoretic.  She sat in her car with the air conditioner blowing, drank a water and ate a honey bun.  She felt better but was fatigued for the rest of the day.  She reports similar events in the past but not as severe since childhood.  She did have another episode the next day as well, less severe.  She drank orange juice and peanut butter crackers and it resolved.  No associated headaches.  She has had other events as well.  When she was around 41, she was at work and she became confused.  She couldn't understand her coworkers and she was lethargic.  She was unable to communicate with them.  She went to the ED where testing was unremarkable.  She had a similar event about a year ago.  She is unsure if associated with headaches because she suffers from so many headaches.  Awake and asleep EEG from 07/01/2019 was normal.  MRI of brain with and without contrast from 07/10/2019 was normal.  She has a spell similar to her prior events in August 2023, but never before as severe.  She wasn't able to sleep the previous night.  She didn't eat breakfast because she had a meeting at work but drank 3 cups of coffee.  On 8/22 at around 11:30 AM, she was at work and sat down at her desk  following a meeting when she suddenly developed numbness and tingling in her fingertips and toes with sensation traveling up her body.  She saw brown spots in her vision and also felt lightheaded, like she was going to pass out, so she lowered herself onto the floor.  She became tremulous, shaking all over.  No loss of consciousness.  Her Emergency planning/management officer brought her a cola to drink.  Her pulse was high.  Blood sugar was 74-78.  .  Tremulousness gradually resolved over an hour.  That evening she began having difficulty with dexterity and trouble typing.  She also endorsed chest tightness and anxiety.  Over the next couple of days, she didn't feel right.  She still had some chest discomfort.  During this period, she had severe insomnia and wasn't able to sleep at all.  That has since resolved.  On the morning of the event, she realized that she had accidentally took an extra bupropion .    Left arm pain: Started having left arm pain over the summer 2024.  No preceding neck or arm trauma.  Starts in the armpit that radiates down medial arm to the wrist.  Reaching for the door, carrying objects aggravates it, such as holding a plate or her purse.  Maybe the wrist, hand and forearm may be a little weak.  She has been dropping objects such as bowls.  Followed  by pain management for chronic pain.  Prior cervical spine treatments include bilateral C3-6 RFA with relief.  Left sided neck pain improved after RFA in June.  Prior MRI of cervical spine on 01/06/2016 revealed reversal of cervical lordosis at C4-5 level with mild disc bulges at C4-5 and C5-6 without spinal stenosis or nerve root impingement.  She had a NCV-EMG on 07/29/2023 which was normal.  It has since resolved.    PAST MEDICAL HISTORY: Past Medical History:  Diagnosis Date   Colon polyps    Depression    Fibromyalgia    GERD (gastroesophageal reflux disease)    Hemorrhoids    Insomnia    Migraine     MEDICATIONS: Current Outpatient Medications on  File Prior to Visit  Medication Sig Dispense Refill   albuterol  (VENTOLIN  HFA) 108 (90 Base) MCG/ACT inhaler Inhale 2 puffs into the lungs every 6 (six) hours as needed for wheezing or shortness of breath. 8 g 2   BIOTIN PO Take 1 tablet by mouth daily.     buPROPion  (WELLBUTRIN  XL) 300 MG 24 hr tablet TAKE 1 TABLET(300 MG) BY MOUTH DAILY 90 tablet 2   Cholecalciferol  (VITAMIN D3) 25 MCG (1000 UT) CAPS Take 1 capsule (1,000 Units total) by mouth daily. 60 capsule    citalopram  (CELEXA ) 40 MG tablet TAKE 1 TABLET(40 MG) BY MOUTH DAILY (Patient taking differently: Take 40 mg by mouth at bedtime.) 90 tablet 1   Erenumab -aooe (AIMOVIG ) 140 MG/ML SOAJ Inject 140 mg as directed every 28 (twenty-eight) days. ADMINISTER 1 ML(140MG ) UNDER THE SKIN EVERY 30 DAYS 1 mL 5   HYDROcodone -acetaminophen  (NORCO/VICODIN) 5-325 MG tablet Take 0.5-1 tablets by mouth every 8 (eight) hours as needed for moderate pain (pain score 4-6).  0   LINZESS 72 MCG capsule Take 72 mcg by mouth every 3 (three) days.     NUVARING 0.12-0.015 MG/24HR vaginal ring Place 1 each vaginally every 28 (twenty-eight) days.     omeprazole  (PRILOSEC) 40 MG capsule Take 1 capsule (40 mg total) by mouth daily. 90 capsule 3   ondansetron  (ZOFRAN -ODT) 4 MG disintegrating tablet DISSOLVE 1 TABLET(4 MG) ON THE TONGUE EVERY 8 HOURS AS NEEDED FOR NAUSEA OR VOMITING 20 tablet 3   Rimegepant Sulfate (NURTEC) 75 MG TBDP Take 1 tablet (75 mg total) by mouth daily as needed. 8 tablet 11   tirzepatide (MOUNJARO) 7.5 MG/0.5ML Pen Inject 7.5 mg into the skin once a week.     tiZANidine  (ZANAFLEX ) 4 MG tablet TAKE 1 TABLET(4 MG) BY MOUTH EVERY 6 HOURS AS NEEDED FOR MUSCLE SPASMS 30 tablet 5   traZODone  (DESYREL ) 150 MG tablet Take 1 tablet (150 mg total) by mouth at bedtime. 90 tablet 1   Vitamin D -Vitamin K (VITAMIN D2 + K1 PO) Take 1 tablet by mouth daily.     No current facility-administered medications on file prior to visit.     ALLERGIES: Allergies   Allergen Reactions   Cymbalta [Duloxetine Hcl]     Ineffective   Lyrica [Pregabalin] Other (See Comments)    Ineffective   Tramadol     Ineffective     FAMILY HISTORY: Family History  Problem Relation Age of Onset   Arthritis Mother    COPD Mother    Depression Mother    Hyperlipidemia Mother    Miscarriages / [place] Mother    Arthritis Father    Depression Father    Heart disease Father    Hyperlipidemia Father    Alcohol abuse Sister  Depression Sister    Drug abuse Sister    Hyperlipidemia Sister    Hypertension Sister    Alcohol abuse Brother    Depression Brother    Drug abuse Brother    Depression Daughter    Alcohol abuse Maternal Grandmother    Depression Maternal Grandmother    Stroke Maternal Grandfather    Arthritis Paternal Grandmother    Diabetes Paternal Grandmother    Stroke Paternal Grandfather    Alcohol abuse Sister    Depression Sister    Drug abuse Sister    Depression Brother       Objective:  *** General: No acute distress.  Patient appears well-groomed.   Head:  Normocephalic/atraumatic Neck:  Supple.  No paraspinal tenderness.  Full range of motion. Heart:  Regular rate and rhythm. Neuro:  Alert and oriented.  Speech fluent and not dysarthric.  Language intact.  CN II-XII intact.  Bulk and tone normal.  Muscle strength 5/5 throughout.  Sensation to light touch intact.  Deep tendon reflexes 2+ throughout, toes downgoing.  Gait normal.  Romberg negative.    Juliene Dunnings, DO  CC: Teresa Hope, MD

## 2024-07-13 ENCOUNTER — Ambulatory Visit: Payer: 59 | Admitting: Neurology

## 2024-08-14 ENCOUNTER — Encounter: Payer: Self-pay | Admitting: Neurology

## 2024-08-14 MED ORDER — AIMOVIG 140 MG/ML ~~LOC~~ SOAJ: 140.0000 mg | SUBCUTANEOUS | 5 refills | Status: AC

## 2024-08-19 NOTE — Progress Notes (Unsigned)
 NEUROLOGY FOLLOW UP OFFICE NOTE  Alexias Margerum [id number]  Assessment/Plan:     Migraine with aura, without status migrainosus, not intractable Hemiplegic migraine     1.  Migraine prevention:  Aimovig  140mg  every 28 days 2.  Migraine rescue:  Instead of Nurtec, she will try samples of Ubrelvy 100mg  and update me; ibuprofen with tizanidine  for moderate/cervicogenic headaches.  Zofran  ODT for nausea.  3.  Keep headache diary 4.  Limit use of pain relievers to no more than 9 days out of the month to prevent risk of rebound or medication-overuse headache. 5.  Lifestyle modification 6.  Follow up 6 months    Subjective:  Skylarr Liz is a 53 year old right-handed woman with fibromyalgia and depression who follows up for migraines and new left upper extremity pain.  MRI reviewed.  History supplemented by hospital records.     UPDATE: Migraines  On 5/19, she developed sudden onset tingling of left upper and lower extremities and left leg weakness associated with headache.  She was seen in the ED at [facility] where CT head was negative for acute findings and she was initiated on TNK and transferred to [facility] for stroke workup.  CTA head and neck showed mild cervical carotid atherosclerosis but no LVO or significant stenosis.  MRI of brain was normal.  TTE revealed LVEF 60-65% with no thrombus, valvular disease and with positive Bubble study.  LDL was 100 and Hgb A1c was 4.3%.  She was subsequently diagnosed with hemiplegic migraine.    As she has had a hemiplegic migraine, rizatriptan  was discontinued and restarted on Nurtec.  Nurtec helps but just isn't as effective as rizatriptan . Intensity:  moderate to severe Duration:  30 minutes with Nurtec but will have postdrome headache through the next day (while migraine lasts 15 minutes with rizatriptan  with just a hint of a headache into the next day) Frequency:  3 times a month.   Rescue:  For moderate:  Tizanidine ,  ibuprofen and ice; For severe:  Rizatriptan  Current NSAIDS: Excedrin Migraine Current analgesics: hydrocodone  (fibromyalgia), ketamine (chronic pain) Current triptans: none. Current ergotamine:  none Current anti-emetic:  ondansetron  ODT 4mg   Current muscle relaxants:  tizanidine  2mg  PRN (muscle spasms) Current anti-anxiolytic:  none Current sleep aide: Trazodone  Current Antihypertensive medications:  none Current Antidepressant medications: citalopram  40 mg daily, bupropion  150 mg Current Anticonvulsant medications: none Current anti-CGRP:  Aimovig  140mg  Current Vitamins/Herbal/Supplements:  B12 Current Antihistamines/Decongestants:  meclizine Other therapy:  cervical radiofrequency ablations; PT neck; ketamine infusions for fibromyalgia, cold compress, heating pad. Hormone/birth control: NuvaRing   Caffeine:  1 cup of coffee daily Diet:  Drinks water all day.  Eats healthy Exercise:  routine Depression:  yes; Anxiety:  yes Other pain:  fibromyalgia, lumbar radiculopathy Sleep hygiene:  Okay with trazodone  and exercise     HISTORY:  Migraines: Onset:  Childhood.  Missed school as a child.  Worse after starting her menstrual cycle. Location:  Bi-frontal Quality:  Pounding Initial Intensity:  Severe.  She denies new headache, thunderclap headache or severe headache that wakes her from sleep. Aura:  Flashing lights/blue colors Prodrome:  none Postdrome:  fatigue Associated symptoms:  Photophobia, phonophobia, nausea, osmophobia, sometimes vomiting.  She denies associated unilateral numbness or weakness. Initial Duration:  2 days with Maxalt  (1 hour if taken at onset) Initial Frequency:  2 migraines past 30 days (15-17 a month prior to topiramate ) Initial Frequency of abortive medication: 2 days past month Triggers: increased depression Relieving factors:  Ice pack on head and heat to back of neck Activity:  aggravates   Left arm pain: Started having left arm pain over the  summer 2024.  No preceding neck or arm trauma.  Starts in the armpit that radiates down medial arm to the wrist.  Reaching for the door, carrying objects aggravates it, such as holding a plate or her purse.  Maybe the wrist, hand and forearm may be a little weak.  She has been dropping objects such as bowls.  Followed by pain management for chronic pain.  Prior cervical spine treatments include bilateral C3-6 RFA with relief.  Left sided neck pain improved after RFA in June.  Prior MRI of cervical spine on 01/06/2016 revealed reversal of cervical lordosis at C4-5 level with mild disc bulges at C4-5 and C5-6 without spinal stenosis or nerve root impingement. For further evaluation of left arm pain, she had a NCV-EMG on 07/29/2023 which was normal.  It has since resolved.     Past medications: Past NSAIDS:  Ibuprofen (kidney problems), naproxen, Toradol  shot Past analgesics:  Tylenol , Excedrin Past abortive triptans: Zomig, sumatriptan tablet, rizatriptan  (effective) - contraindicated as she has had a hemiplegic migraine Past abortive ergotamine:  none Past muscle relaxants:  none Past anti-emetic:  none Past antihypertensive medications:  none Past antidepressant medications:  Amitriptyline 25mg  Past anticonvulsant medications:  Topiramate  100 mg twice daily (on higher doses she had hair thinning, weight loss), gabapentin Past anti-CGRP:  Nurtec Past vitamins/Herbal/Supplements:  none Past antihistamines/decongestants:  none Other past therapies:  Botox (effective.  However, she was having trouble getting the Botox due to cost), Gamacor vagus nerve stimulator (abortive treatment, effective), cervical nerve ablations (variable efficacy).   Family history of headache:  Father, paternal grandfather, 2 aunts   Transient Spells: In June 2020, she was in the grocery store when she started feeling dizzy.  She couldn't hear what the lady at check out was saying.  Everything was cloudy and her legs felt they  were going to buckle.  She felt hot and diaphoretic.  She sat in her car with the air conditioner blowing, drank a water and ate a honey bun.  She felt better but was fatigued for the rest of the day.  She reports similar events in the past but not as severe since childhood.  She did have another episode the next day as well, less severe.  She drank orange juice and peanut butter crackers and it resolved.  No associated headaches.  She has had other events as well.  When she was around 41, she was at work and she became confused.  She couldn't understand her coworkers and she was lethargic.  She was unable to communicate with them.  She went to the ED where testing was unremarkable.  She had a similar event about a year ago.  She is unsure if associated with headaches because she suffers from so many headaches.  Awake and asleep EEG from 07/01/2019 was normal.  MRI of brain with and without contrast from 07/10/2019 was normal.  She has a spell similar to her prior events in August 2023, but never before as severe.  She wasn't able to sleep the previous night.  She didn't eat breakfast because she had a meeting at work but drank 3 cups of coffee.  On 8/22 at around 11:30 AM, she was at work and sat down at her desk following a meeting when she suddenly developed numbness and tingling in her fingertips and toes  with sensation traveling up her body.  She saw brown spots in her vision and also felt lightheaded, like she was going to pass out, so she lowered herself onto the floor.  She became tremulous, shaking all over.  No loss of consciousness.  Her Emergency planning/management officer brought her a cola to drink.  Her pulse was high.  Blood sugar was 74-78.  .  Tremulousness gradually resolved over an hour.  That evening she began having difficulty with dexterity and trouble typing.  She also endorsed chest tightness and anxiety.  Over the next couple of days, she didn't feel right.  She still had some chest discomfort.  During this  period, she had severe insomnia and wasn't able to sleep at all.  That has since resolved.  On the morning of the event, she realized that she had accidentally took an extra bupropion .    PAST MEDICAL HISTORY: Past Medical History:  Diagnosis Date   Colon polyps    Depression    Fibromyalgia    GERD (gastroesophageal reflux disease)    Hemorrhoids    Insomnia    Migraine     MEDICATIONS: Current Outpatient Medications on File Prior to Visit  Medication Sig Dispense Refill   albuterol  (VENTOLIN  HFA) 108 (90 Base) MCG/ACT inhaler Inhale 2 puffs into the lungs every 6 (six) hours as needed for wheezing or shortness of breath. 8 g 2   BIOTIN PO Take 1 tablet by mouth daily.     buPROPion  (WELLBUTRIN  XL) 300 MG 24 hr tablet TAKE 1 TABLET(300 MG) BY MOUTH DAILY 90 tablet 2   Cholecalciferol  (VITAMIN D3) 25 MCG (1000 UT) CAPS Take 1 capsule (1,000 Units total) by mouth daily. 60 capsule    citalopram  (CELEXA ) 40 MG tablet TAKE 1 TABLET(40 MG) BY MOUTH DAILY (Patient taking differently: Take 40 mg by mouth at bedtime.) 90 tablet 1   Erenumab -aooe (AIMOVIG ) 140 MG/ML SOAJ Inject 140 mg as directed every 28 (twenty-eight) days. ADMINISTER 1 ML(140MG ) UNDER THE SKIN EVERY 30 DAYS 1 mL 5   HYDROcodone -acetaminophen  (NORCO/VICODIN) 5-325 MG tablet Take 0.5-1 tablets by mouth every 8 (eight) hours as needed for moderate pain (pain score 4-6).  0   LINZESS 72 MCG capsule Take 72 mcg by mouth every 3 (three) days.     NUVARING 0.12-0.015 MG/24HR vaginal ring Place 1 each vaginally every 28 (twenty-eight) days.     omeprazole  (PRILOSEC) 40 MG capsule Take 1 capsule (40 mg total) by mouth daily. 90 capsule 3   ondansetron  (ZOFRAN -ODT) 4 MG disintegrating tablet DISSOLVE 1 TABLET(4 MG) ON THE TONGUE EVERY 8 HOURS AS NEEDED FOR NAUSEA OR VOMITING 20 tablet 3   Rimegepant Sulfate (NURTEC) 75 MG TBDP Take 1 tablet (75 mg total) by mouth daily as needed. 8 tablet 11   tirzepatide (MOUNJARO) 7.5 MG/0.5ML Pen  Inject 7.5 mg into the skin once a week.     tiZANidine  (ZANAFLEX ) 4 MG tablet TAKE 1 TABLET(4 MG) BY MOUTH EVERY 6 HOURS AS NEEDED FOR MUSCLE SPASMS 30 tablet 5   traZODone  (DESYREL ) 150 MG tablet Take 1 tablet (150 mg total) by mouth at bedtime. 90 tablet 1   Vitamin D -Vitamin K (VITAMIN D2 + K1 PO) Take 1 tablet by mouth daily.     No current facility-administered medications on file prior to visit.     ALLERGIES: Allergies  Allergen Reactions   Cymbalta [Duloxetine Hcl]     Ineffective   Lyrica [Pregabalin] Other (See Comments)    Ineffective  Tramadol     Ineffective     FAMILY HISTORY: Family History  Problem Relation Age of Onset   Arthritis Mother    COPD Mother    Depression Mother    Hyperlipidemia Mother    Miscarriages / [place] Mother    Arthritis Father    Depression Father    Heart disease Father    Hyperlipidemia Father    Alcohol abuse Sister    Depression Sister    Drug abuse Sister    Hyperlipidemia Sister    Hypertension Sister    Alcohol abuse Brother    Depression Brother    Drug abuse Brother    Depression Daughter    Alcohol abuse Maternal Grandmother    Depression Maternal Grandmother    Stroke Maternal Grandfather    Arthritis Paternal Grandmother    Diabetes Paternal Grandmother    Stroke Paternal Grandfather    Alcohol abuse Sister    Depression Sister    Drug abuse Sister    Depression Brother       Objective:  Blood pressure 109/72, pulse 76, height 5' 5 (1.651 m), weight 141 lb (64 kg), SpO2 98%. General: No acute distress.  Patient appears well-groomed.   Head:  Normocephalic/atraumatic Neck:  Supple.  No paraspinal tenderness.  Full range of motion. Heart:  Regular rate and rhythm. Neuro:  Alert and oriented.  Speech fluent and not dysarthric.  Language intact.  CN II-XII intact.  Bulk and tone normal.  Muscle strength 5/5 throughout.  Sensation to light touch intact.  Deep tendon reflexes 2+ throughout, toes  downgoing.  Gait normal.  Romberg negative.    Juliene Dunnings, DO  CC: Glade Hope, MD

## 2024-08-20 ENCOUNTER — Encounter: Payer: Self-pay | Admitting: Neurology

## 2024-08-20 ENCOUNTER — Ambulatory Visit (INDEPENDENT_AMBULATORY_CARE_PROVIDER_SITE_OTHER): Admitting: Neurology

## 2024-08-20 VITALS — BP 109/72 | HR 76 | Ht 65.0 in | Wt 141.0 lb

## 2024-08-20 DIAGNOSIS — G43409 Hemiplegic migraine, not intractable, without status migrainosus: Secondary | ICD-10-CM | POA: Diagnosis not present

## 2024-08-20 DIAGNOSIS — G43109 Migraine with aura, not intractable, without status migrainosus: Secondary | ICD-10-CM | POA: Diagnosis not present

## 2024-08-20 MED ORDER — ONDANSETRON 4 MG PO TBDP: ORAL_TABLET | ORAL | 5 refills | Status: AC

## 2024-08-20 NOTE — Patient Instructions (Addendum)
 Continue Aimovig  140mg  every 4 weeks Take Ubrelvy at earliest onset of migraine.  May repeat after 2 hours.  Maximum 2 tablets in 24 hours. Ondansetron  for nausea

## 2024-08-20 NOTE — Progress Notes (Signed)
 Medication Samples have been provided to the patient.  Drug name: Holland       Strength: 100 mg        Qty: 3  LOT: [id number]  Exp.Date: 12/26  Dosing instructions: as needed  The patient has been instructed regarding the correct time, dose, and frequency of taking this medication, including desired effects and most common side effects.   Teresa Campbell 10:23 AM 08/20/2024

## 2024-09-17 ENCOUNTER — Telehealth: Payer: Self-pay | Admitting: Neurology

## 2024-09-17 NOTE — Telephone Encounter (Signed)
 Teresa Campbell from [employer] needs to know what instruction needs to go with the Aimovig . Is it every  28 days or 30 days. Amanda's number is [phone]. Thanks

## 2024-09-17 NOTE — Telephone Encounter (Signed)
Every 28 days

## 2024-09-30 ENCOUNTER — Other Ambulatory Visit: Payer: Self-pay | Admitting: Internal Medicine

## 2024-10-21 ENCOUNTER — Encounter: Payer: Self-pay | Admitting: Internal Medicine

## 2024-11-08 ENCOUNTER — Encounter: Payer: Self-pay | Admitting: Internal Medicine

## 2024-11-08 NOTE — Patient Instructions (Addendum)
      Blood work was ordered.       Medications changes include :   None    A referral was ordered and someone will call you to schedule an appointment.     Return in about 6 months (around 05/11/2025) for Physical Exam.

## 2024-11-08 NOTE — Progress Notes (Signed)
 "     Subjective:    Patient ID: Teresa Campbell, female    DOB: 1971-03-15, 53 y.o.   MRN: [id number]     HPI Teresa Campbell is here for follow up of her chronic medical problems.   ? labs Medications and allergies reviewed with patient and updated if appropriate.  Current Outpatient Medications on File Prior to Visit  Medication Sig Dispense Refill   albuterol  (VENTOLIN  HFA) 108 (90 Base) MCG/ACT inhaler Inhale 2 puffs into the lungs every 6 (six) hours as needed for wheezing or shortness of breath. 8 g 2   BIOTIN PO Take 1 tablet by mouth daily.     buPROPion  (WELLBUTRIN  XL) 300 MG 24 hr tablet TAKE 1 TABLET(300 MG) BY MOUTH DAILY 90 tablet 2   Cholecalciferol  (VITAMIN D3) 25 MCG (1000 UT) CAPS Take 1 capsule (1,000 Units total) by mouth daily. 60 capsule    citalopram  (CELEXA ) 40 MG tablet TAKE 1 TABLET(40 MG) BY MOUTH DAILY 90 tablet 1   Erenumab -aooe (AIMOVIG ) 140 MG/ML SOAJ Inject 140 mg as directed every 28 (twenty-eight) days. ADMINISTER 1 ML(140MG ) UNDER THE SKIN EVERY 30 DAYS 1 mL 5   HYDROcodone -acetaminophen  (NORCO/VICODIN) 5-325 MG tablet Take 0.5-1 tablets by mouth every 8 (eight) hours as needed for moderate pain (pain score 4-6).  0   LINZESS 72 MCG capsule Take 72 mcg by mouth every 3 (three) days.     NUVARING 0.12-0.015 MG/24HR vaginal ring Place 1 each vaginally every 28 (twenty-eight) days.     omeprazole  (PRILOSEC) 40 MG capsule Take 1 capsule (40 mg total) by mouth daily. 90 capsule 3   ondansetron  (ZOFRAN -ODT) 4 MG disintegrating tablet DISSOLVE 1 TABLET(4 MG) ON THE TONGUE EVERY 8 HOURS AS NEEDED FOR NAUSEA OR VOMITING 20 tablet 5   Rimegepant Sulfate (NURTEC) 75 MG TBDP Take 1 tablet (75 mg total) by mouth daily as needed. 8 tablet 11   tirzepatide (MOUNJARO) 7.5 MG/0.5ML Pen Inject 7.5 mg into the skin once a week.     tiZANidine  (ZANAFLEX ) 4 MG tablet TAKE 1 TABLET(4 MG) BY MOUTH EVERY 6 HOURS AS NEEDED FOR MUSCLE SPASMS (Patient taking differently: Take 4 mg by mouth  as needed.) 30 tablet 5   traZODone  (DESYREL ) 150 MG tablet Take 1 tablet (150 mg total) by mouth at bedtime. 90 tablet 1   Vitamin D -Vitamin K (VITAMIN D2 + K1 PO) Take 1 tablet by mouth daily.     No current facility-administered medications on file prior to visit.     Review of Systems     Objective:  There were no vitals filed for this visit. BP Readings from Last 3 Encounters:  08/20/24 109/72  06/25/24 100/70  05/11/24 108/72   Wt Readings from Last 3 Encounters:  08/20/24 141 lb (64 kg)  06/25/24 137 lb 8 oz (62.4 kg)  05/11/24 136 lb (61.7 kg)   There is no height or weight on file to calculate BMI.    Physical Exam     Lab Results  Component Value Date   WBC 10.8 (H) 04/27/2024   HGB 13.0 04/27/2024   HCT 39.9 04/27/2024   PLT 361 04/27/2024   GLUCOSE 81 04/27/2024   CHOL 172 04/28/2024   TRIG 79 04/28/2024   HDL 56 04/28/2024   LDLCALC 100 (H) 04/28/2024   ALT 10 04/27/2024   AST 19 04/27/2024   NA 138 04/27/2024   K 3.7 04/27/2024   CL 107 04/27/2024   CREATININE 0.97 04/27/2024  BUN 16 04/27/2024   CO2 23 04/27/2024   TSH 3.75 07/25/2023   INR 1.0 04/27/2024   HGBA1C 4.3 (L) 04/28/2024     Assessment & Plan:    See Problem List for Assessment and Plan of chronic medical problems.   This encounter was created in error - please disregard. "

## 2024-11-09 ENCOUNTER — Encounter: Payer: Self-pay | Admitting: Internal Medicine

## 2024-11-10 ENCOUNTER — Encounter: Admitting: Internal Medicine

## 2024-11-10 DIAGNOSIS — F5104 Psychophysiologic insomnia: Secondary | ICD-10-CM

## 2024-11-10 DIAGNOSIS — E7800 Pure hypercholesterolemia, unspecified: Secondary | ICD-10-CM

## 2024-11-10 DIAGNOSIS — N1831 Chronic kidney disease, stage 3a: Secondary | ICD-10-CM

## 2024-11-10 DIAGNOSIS — F32A Depression, unspecified: Secondary | ICD-10-CM

## 2024-11-12 ENCOUNTER — Telehealth: Payer: Self-pay | Admitting: Neurology

## 2024-11-12 ENCOUNTER — Other Ambulatory Visit: Payer: Self-pay | Admitting: Neurology

## 2024-11-12 NOTE — Telephone Encounter (Signed)
 Pharmacy needs clarification on Amovig directions

## 2024-11-12 NOTE — Telephone Encounter (Signed)
 Inject 140 mg into the skin subcue.

## 2024-11-23 ENCOUNTER — Other Ambulatory Visit: Payer: Self-pay | Admitting: Neurology

## 2024-11-24 NOTE — Patient Instructions (Addendum)
° ° ° °  Flu immunization administered today.       Medications changes include :   None     Return in about 6 months (around 05/26/2025) for Physical Exam.
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
## 2024-11-25 ENCOUNTER — Encounter: Payer: Self-pay | Admitting: Internal Medicine

## 2024-11-25 ENCOUNTER — Ambulatory Visit: Admitting: Internal Medicine

## 2024-11-25 VITALS — BP 110/70 | HR 65 | Temp 98.4°F | Ht 65.0 in | Wt 146.0 lb

## 2024-11-25 DIAGNOSIS — F419 Anxiety disorder, unspecified: Secondary | ICD-10-CM

## 2024-11-25 DIAGNOSIS — M797 Fibromyalgia: Secondary | ICD-10-CM

## 2024-11-25 DIAGNOSIS — F5104 Psychophysiologic insomnia: Secondary | ICD-10-CM | POA: Diagnosis not present

## 2024-11-25 DIAGNOSIS — N1831 Chronic kidney disease, stage 3a: Secondary | ICD-10-CM | POA: Diagnosis not present

## 2024-11-25 DIAGNOSIS — F32A Depression, unspecified: Secondary | ICD-10-CM

## 2024-11-25 DIAGNOSIS — Z23 Encounter for immunization: Secondary | ICD-10-CM

## 2024-11-25 NOTE — Assessment & Plan Note (Addendum)
 Chronic Difficulty falling asleep and staying asleep Has episodes of insomnia that can last a few days intermittently Sleep is interrupted which is affecting her overall quality of sleep and is likely part of the reason why she is fatigued Her chronic back pain is likely contributing as well Continue Trazodone  150 mg nightly She also takes tizanidine  at night sometimes which helps Continue magnesium supplement

## 2024-11-25 NOTE — Assessment & Plan Note (Addendum)
 Chronic Likely related to prior nsaids use Mild - improved  Taking nsaids occasionally-she is keeping this to a minimum Maintain good water intake

## 2024-11-25 NOTE — Assessment & Plan Note (Addendum)
 Chronic Has had some increased anxiety and stress, but overall controlled Continue citalopram  40 mg daily and bupropion  XL 300 mg daily

## 2024-11-25 NOTE — Assessment & Plan Note (Signed)
 Chronic Follows with pain management in [place] Taking hydrocodone  as prescribed Getting ketamine treatments with pain management Pain variable

## 2024-12-29 ENCOUNTER — Other Ambulatory Visit: Payer: Self-pay | Admitting: Internal Medicine

## 2025-01-13 ENCOUNTER — Other Ambulatory Visit: Payer: Self-pay | Admitting: Neurology

## 2025-01-27 ENCOUNTER — Ambulatory Visit: Admitting: Neurology

## 2025-02-17 ENCOUNTER — Ambulatory Visit: Admitting: Neurology

## 2025-05-26 ENCOUNTER — Ambulatory Visit: Admitting: Internal Medicine
